# Patient Record
Sex: Female | Born: 1955 | Race: White | Hispanic: No | Marital: Married | Smoking: Current every day smoker
Health system: Southern US, Community
[De-identification: ages and names within clinical notes are randomized; demographics above are authoritative.]

## PROBLEM LIST (undated history)

## (undated) DIAGNOSIS — D638 Anemia in other chronic diseases classified elsewhere: Secondary | ICD-10-CM

## (undated) DIAGNOSIS — D689 Coagulation defect, unspecified: Secondary | ICD-10-CM

## (undated) DIAGNOSIS — I639 Cerebral infarction, unspecified: Secondary | ICD-10-CM

## (undated) DIAGNOSIS — I10 Essential (primary) hypertension: Secondary | ICD-10-CM

## (undated) DIAGNOSIS — K760 Fatty (change of) liver, not elsewhere classified: Secondary | ICD-10-CM

## (undated) DIAGNOSIS — K7030 Alcoholic cirrhosis of liver without ascites: Secondary | ICD-10-CM

## (undated) DIAGNOSIS — K922 Gastrointestinal hemorrhage, unspecified: Secondary | ICD-10-CM

## (undated) DIAGNOSIS — D696 Thrombocytopenia, unspecified: Secondary | ICD-10-CM

## (undated) DIAGNOSIS — S32000A Wedge compression fracture of unspecified lumbar vertebra, initial encounter for closed fracture: Secondary | ICD-10-CM

## (undated) DIAGNOSIS — Z8719 Personal history of other diseases of the digestive system: Secondary | ICD-10-CM

## (undated) DIAGNOSIS — K8020 Calculus of gallbladder without cholecystitis without obstruction: Secondary | ICD-10-CM

## (undated) DIAGNOSIS — F1010 Alcohol abuse, uncomplicated: Secondary | ICD-10-CM

## (undated) DIAGNOSIS — IMO0001 Reserved for inherently not codable concepts without codable children: Secondary | ICD-10-CM

## (undated) DIAGNOSIS — K219 Gastro-esophageal reflux disease without esophagitis: Secondary | ICD-10-CM

## (undated) DIAGNOSIS — F1027 Alcohol dependence with alcohol-induced persisting dementia: Secondary | ICD-10-CM

## (undated) DIAGNOSIS — F0390 Unspecified dementia without behavioral disturbance: Secondary | ICD-10-CM

## (undated) DIAGNOSIS — K7460 Unspecified cirrhosis of liver: Secondary | ICD-10-CM

## (undated) DIAGNOSIS — IMO0002 Reserved for concepts with insufficient information to code with codable children: Secondary | ICD-10-CM

## (undated) HISTORY — PX: HIP FRACTURE SURGERY: SHX118

## (undated) HISTORY — PX: ESOPHAGOGASTRODUODENOSCOPY (EGD) WITH ESOPHAGEAL DILATION: SHX5812

## (undated) HISTORY — PX: APPENDECTOMY: SHX54

## (undated) HISTORY — PX: COLONOSCOPY: SHX174

## (undated) HISTORY — PX: OTHER SURGICAL HISTORY: SHX169

---

## 2003-02-11 ENCOUNTER — Inpatient Hospital Stay (HOSPITAL_COMMUNITY): Admission: EM | Admit: 2003-02-11 | Discharge: 2003-02-12 | Payer: Self-pay | Admitting: Emergency Medicine

## 2003-02-11 ENCOUNTER — Encounter: Payer: Self-pay | Admitting: Emergency Medicine

## 2003-02-12 ENCOUNTER — Encounter: Payer: Self-pay | Admitting: *Deleted

## 2003-06-02 ENCOUNTER — Encounter: Payer: Self-pay | Admitting: Emergency Medicine

## 2003-06-02 ENCOUNTER — Emergency Department (HOSPITAL_COMMUNITY): Admission: EM | Admit: 2003-06-02 | Discharge: 2003-06-02 | Payer: Self-pay | Admitting: Emergency Medicine

## 2005-09-26 ENCOUNTER — Emergency Department (HOSPITAL_COMMUNITY): Admission: EM | Admit: 2005-09-26 | Discharge: 2005-09-26 | Payer: Self-pay | Admitting: Emergency Medicine

## 2006-09-11 DIAGNOSIS — IMO0001 Reserved for inherently not codable concepts without codable children: Secondary | ICD-10-CM

## 2006-09-11 DIAGNOSIS — I639 Cerebral infarction, unspecified: Secondary | ICD-10-CM

## 2006-09-11 HISTORY — DX: Cerebral infarction, unspecified: I63.9

## 2006-09-11 HISTORY — DX: Reserved for inherently not codable concepts without codable children: IMO0001

## 2007-04-12 HISTORY — PX: ESOPHAGOGASTRODUODENOSCOPY: SHX1529

## 2007-04-30 ENCOUNTER — Emergency Department (HOSPITAL_COMMUNITY): Admission: EM | Admit: 2007-04-30 | Discharge: 2007-05-01 | Payer: Self-pay | Admitting: Emergency Medicine

## 2007-05-01 ENCOUNTER — Ambulatory Visit: Payer: Self-pay | Admitting: Internal Medicine

## 2007-05-01 ENCOUNTER — Ambulatory Visit (HOSPITAL_COMMUNITY): Admission: RE | Admit: 2007-05-01 | Discharge: 2007-05-01 | Payer: Self-pay | Admitting: Internal Medicine

## 2007-05-01 ENCOUNTER — Encounter: Payer: Self-pay | Admitting: Internal Medicine

## 2007-05-21 ENCOUNTER — Ambulatory Visit (HOSPITAL_COMMUNITY): Admission: RE | Admit: 2007-05-21 | Discharge: 2007-05-21 | Payer: Self-pay | Admitting: Internal Medicine

## 2007-06-20 ENCOUNTER — Ambulatory Visit: Payer: Self-pay | Admitting: Gastroenterology

## 2007-07-13 HISTORY — PX: ESOPHAGOGASTRODUODENOSCOPY: SHX1529

## 2007-07-15 ENCOUNTER — Ambulatory Visit (HOSPITAL_COMMUNITY): Admission: RE | Admit: 2007-07-15 | Discharge: 2007-07-15 | Payer: Self-pay | Admitting: Internal Medicine

## 2007-07-15 ENCOUNTER — Encounter: Payer: Self-pay | Admitting: Internal Medicine

## 2007-07-15 ENCOUNTER — Ambulatory Visit: Payer: Self-pay | Admitting: Internal Medicine

## 2008-06-17 ENCOUNTER — Inpatient Hospital Stay (HOSPITAL_COMMUNITY): Admission: EM | Admit: 2008-06-17 | Discharge: 2008-06-17 | Payer: Self-pay | Admitting: Emergency Medicine

## 2009-11-30 ENCOUNTER — Emergency Department (HOSPITAL_COMMUNITY): Admission: EM | Admit: 2009-11-30 | Discharge: 2009-12-01 | Payer: Self-pay | Admitting: Emergency Medicine

## 2010-03-23 ENCOUNTER — Emergency Department (HOSPITAL_COMMUNITY): Admission: EM | Admit: 2010-03-23 | Discharge: 2010-03-24 | Payer: Self-pay | Admitting: Emergency Medicine

## 2010-10-02 ENCOUNTER — Encounter: Payer: Self-pay | Admitting: Internal Medicine

## 2010-10-03 ENCOUNTER — Encounter: Payer: Self-pay | Admitting: Internal Medicine

## 2010-12-04 LAB — URINALYSIS, ROUTINE W REFLEX MICROSCOPIC
Bilirubin Urine: NEGATIVE
Hgb urine dipstick: NEGATIVE
Ketones, ur: NEGATIVE mg/dL
Nitrite: NEGATIVE
Protein, ur: NEGATIVE mg/dL

## 2010-12-04 LAB — BASIC METABOLIC PANEL
CO2: 20 mEq/L (ref 19–32)
Calcium: 9.4 mg/dL (ref 8.4–10.5)
Chloride: 99 mEq/L (ref 96–112)
Creatinine, Ser: 0.63 mg/dL (ref 0.4–1.2)
GFR calc Af Amer: >60 mL/min (ref >60)
GFR calc non Af Amer: >60 mL/min (ref >60)
Glucose, Bld: 101 mg/dL — ABNORMAL HIGH (ref 70–99)
Sodium: 134 mEq/L — ABNORMAL LOW (ref 135–145)

## 2010-12-04 LAB — URINE CULTURE

## 2010-12-04 LAB — DIFFERENTIAL
Eosinophils Absolute: 0.2 10*3/uL (ref 0.0–0.7)
Lymphs Abs: 3.1 10*3/uL (ref 0.7–4.0)
Neutro Abs: 3.1 10*3/uL (ref 1.7–7.7)

## 2010-12-04 LAB — CBC
MCHC: 35.7 g/dL (ref 30.0–36.0)
Platelets: 151 10*3/uL (ref 150–400)
RDW: 15.5 % (ref 11.5–15.5)
WBC: 7 10*3/uL (ref 4.0–10.5)

## 2010-12-04 LAB — POCT CARDIAC MARKERS
CKMB, poc: <1 ng/mL — ABNORMAL LOW (ref 1.0–8.0)
Myoglobin, poc: 34.3 ng/mL (ref 12–200)
Troponin i, poc: <0.05 ng/mL (ref 0.00–0.09)

## 2011-01-24 NOTE — Op Note (Signed)
[name], ZAGAMI               ACCOUNT NO.:  [id number]   MEDICAL RECORD NO.:  [id number]          PATIENT TYPE:  AMB   LOCATION:  DAY                           FACILITY:  [facility]   PHYSICIAN:  R. Roetta Sessions, M.D. DATE OF BIRTH:  July 11, 1956   DATE OF PROCEDURE:  05/01/2007  DATE OF DISCHARGE:                               OPERATIVE REPORT   PROCEDURE:  EGD with biopsy.   INDICATIONS FOR PROCEDURE:  The patient is a 55 year old Caucasian  female who presented to Dr. Colon Branch in the ED last evening with chest  pain.  She was worked up and not felt to have any acute cardiopulmonary  issues, but Dr. Colon Branch noted that in her history [name] has had  problems with progressive esophageal dysphagia to solids and pills for  the past 3 months, and she reported a 65 pound weight loss largely due  to her inability to get food down.  She has not had any melena or rectal  bleeding.  She is a lifelong smoker and consumes at least 3 mixed drinks  weekly.  Her labs are pretty much unremarkable, although she did not  have any LFTs done.  Her chest pain resolved with symptomatic treatment  in the ED.  Dr. Colon Branch called me Debbie morning, and we arranged for her  to come for an EGD today.  I have discussed Debbie approach with Ms.  Valentine.  The potential risks, benefits, and alternatives have been  reviewed and questions answered.  She is agreeable.  Please see the  documentation in the medical record.   PROCEDURE:  O2 saturation, blood pressure, pulses, and respirations were  monitored throughout the entire procedure.  Conscious sedation was with  Versed 5 mg IV, Demerol 100 mg IV in divided doses, Cetacaine spray for  topical oropharyngeal anesthesia.  The instrument used was the Pentax  video chip system.   FINDINGS:  Examination of the tubular esophagus revealed a very tight  distal stricture.  It appeared to be benign.  Initially it would not  admit the scope.  I did not see any evidence of  neoplasm, Barrett's  esophagus, or esophagitis.  I applied gentle steady pressure, and scope  eventually traversed the strictured EG junction.  There was traumatic  dilation of Debbie area with a superficial tear of the overlying mucosa.  Please see photos.  There were no other apparent complications.   Stomach:  The gastric cavity was empty and insufflated well with air.  Thorough examination of the gastric mucosa including retroflex view of  the proximal stomach and esophagogastric junction demonstrated a small  hiatal hernia and superficial antral erosions and ulcerations.  There  was no obvious infiltrating process.  Particularly, the cardia looked  okay, aside from a small hiatal hernia.  The pylorus was patent and  easily traversed.   Duodenum:  Examination of the bulb and second portion again revealed  superficial ulcerations and erosions extending down through the second  portion of the duodenum.  There was a 1.5 to 2-cm submucosal nodule in  the second portion of the duodenum.  Please see photos.  Biopsies of the  duodenum and antral mucosa were taken for histologic study.   There was a respectable dilation performed with the passage of the  scope.  I did not feel would be safe to perform any additional dilations  with Maloney's, etc., during today's session.  The patient tolerated the  procedure well and was reactive in endoscopy.   IMPRESSION:  1. Critical tight, benign-appearing peptic stricture, status post      partial dilation with passage of the scope.  2. Small hiatal hernia.  3. Multiple antral erosions and superficial ulcerations extending down      into the second portion of the duodenum, submucosal mass in the      second portion of the duodenum of uncertain significance, status      post biopsy of the duodenum and antrum.   RECOMMENDATIONS:  1. [name] has had an astounding weight loss, unusually impressive      with a benign peptic stricture (if that is all  that is going on in      Debbie nice Valentine).  She needs to have a chest, abdomen, and pelvic CT      with IV contrast in the very near future to rule out other      coexisting processes.  2. Will obtain a baseline hepatic profile and obtain H pylori      serologies.  3. I have arranged for her to get Prevacid samples, 30 mg orally      b.i.d., enough for the next 6 weeks through my office.  When she      leaves here, she is to go get those samples and start on the      medication.  Literature on gastroesophageal reflux disease provided      to [name].  4. Will arrange for her to come back to the office to see [place] in 6      weeks.  She will need a repeat EGD in 6 weeks or so to reassess her      upper GI tract and in all likelihood will perform an additional      dilation.  5. Would also contemplate her first ever screening colonoscopy at that      time depending on how she does between now and then.   I would like to thank Dr. Greta Doom for allowing me to see Debbie very  interesting Valentine today.      Jonathon Bellows, M.D.  Electronically Signed     RMR/MEDQ  D:  05/01/2007  T:  05/02/2007  Job:  [id number]   cc:   Nicoletta Dress. Colon Branch, M.D.  Fax: [phone]   [facility] Dept. Freida Busman

## 2011-01-24 NOTE — H&P (Signed)
[name], [name]               ACCOUNT NO.:  [id number]   MEDICAL RECORD NO.:  [id number]          PATIENT TYPE:  INP   LOCATION:  A329                          FACILITY:  [facility]   PHYSICIAN:  Margaretmary Dys, M.D.DATE OF BIRTH:  Feb 26, 1956   DATE OF ADMISSION:  06/16/2008  DATE OF DISCHARGE:  LH                              HISTORY & PHYSICAL   ADMISSION DIAGNOSES:  1. Chest pain rule out MI (myocardial infarction).  2. History of uncontrolled hypertension.  3. History of cerebrovascular accident with left hemiparesis about a      year ago diagnosed as transient ischemic attack.  4. Acute alcohol intoxication with a blood alcohol level of 0.26.   CHIEF COMPLAINT:  Chest discomfort.   HISTORY OF PRESENT ILLNESS:  [name] is a 55 year old female who  presented to the emergency room with complaints of some coolness and  chest discomfort on the left side of her chest.  The patient reports  that this started about 5:30 a.m. yesterday.  The pain was on and off  intermittently.  She rates it about a 5/10.  The patient denies any  exacerbating factors.  When the patient was seen in the emergency room  she reported that she was pain free, however, she was asked to come to  the hospital by her son.   The patient did have a history of cerebrovascular accident with left  hemiparesis and dysarthria about a few months ago.  The patient said  this resolved completely.  She was told she had hypertension in the  health department and was supposed to be taking some medications and  aspirin once a day but she did not take those.  The patient said she  drinks alcohol and smokes.   The patient's chest pain was mostly located on the left side mostly  substernal and radiating to her left upper arm.  She said it was a  little bit of squeezing sometimes with tightness with no crushing  pleuritic pain.  She denies any significant cough.  She denies any prior  peptic ulcer like pain symptoms  which the patient has had in the past.  She denies any other complaints.   Evaluation in the emergency room was unremarkable.  The patient had a  negative chest x-ray, negative EKG.  Cardiac markers were negative  initially.  The patient is now being admitted for further evaluation and  management.   PAST MEDICAL HISTORY:  1. History of uncontrolled hypertension.  2. History of cerebrovascular accident with left hemiparesis and      dysarthria about 4 months ago.  3. History of esophageal stricture.  4. History of peptic ulcer disease.  5. Prior appendectomy.  6. The patient does have a history of EGD with dilatation by Dr. Jena Gauss      about a year ago.  The patient is scheduled for another EGD with      dilatation soon.   MEDICATIONS:  The patient does not take any medication.   ALLERGIES:  No known drug allergies.   FAMILY HISTORY:  Positive for hypertension and CVA in  father.   SOCIAL HISTORY:  The patient is married, lives with husband.  She has  two children.  She continues to drink alcohol with fair amount of vodka  every day.  She also smokes about a pack of cigarettes a day.  She is  currently not employed.  She denies any illicit drug use.  She denies  any recent ingestion of nonsteroidal anti-inflammatory agents.   Please note that from a prior admission the patient is supposed to be on  the following medications:   MEDICATIONS:  1. Hydrochlorothiazide 25 mg p.o. daily.  2. Aspirin 81 mg p.o. daily.  3. Vicodin 5/500 as needed.  4. Prevpac which she had for 2 days after a colonoscopy.   PHYSICAL EXAM:  GENERAL:  The patient was conscious, alert, comfortable,  not in acute distress, well oriented in time, place and person.  VITAL SIGNS:  Blood pressure on arrival was 135/85, pulse was 98.2,  respiratory rate of 18, temperature 98.2 degrees Fahrenheit, oxygen  saturation was 99% on room air.  HEENT:  Normocephalic, atraumatic.  Oral mucosa was moist with no   exudates.  NECK:  Supple.  No JVD or lymphadenopathy.  LUNGS:  Were clear clinically with good air entry bilaterally.  HEART:  S1-S2 regular.  No S3, S4, gallops or rubs.  ABDOMEN:  Soft, nontender.  Bowel sounds positive.  No masses palpable.  EXTREMITIES:  No pedal edema.  No calf induration or tenderness was  noted.  CNS:  Exam was grossly intact with no focal neurological deficits.  The  patient's strength and tone in the left extremities were noted to be  normal.   LABORATORY AND DIAGNOSTIC DATA:  White blood cell count was 6.9,  hemoglobin of 13.2, hematocrit 37.2, platelet count was 165 with no left  shift.  Sodium 134, potassium 3.1, chloride of 98, CO2 was 22, glucose  108, BUN of 5, creatinine was 0.74.  AST was 64, ALT of 15.  Initial  cardiac markers were negative.  Blood alcohol level was 0.281.  Chest x-  ray was negative with no active disease.  A 12-lead EKG shows normal  sinus rhythm with no acute ST-T changes.   ASSESSMENT:  This is a 55 year old lady presenting with chest pain.  Although the chest pain appears atypical she does have some risk factors  including hypertension, prior history of stroke, unknown lipid status  and smoking.   PLAN:  1. Will admit to the medical floor.  2. Will obtain cardiac enzymes to rule out myocardial infarction.  3. The patient may as well have a gastrointestinal complaint, this may      be related to her gastrointestinal.  She does have history of      esophageal stricture.  If her cardiac enzymes are negative and we      do consult with cardiology and cardiology rules out any cardiac      event the patient will need to see GI.  4. Will start aspirin 81 mg once a day.  5. The patient will be placed on DVT prophylaxis with Lovenox and GI      prophylaxis with Protonix.  6. If the patient rules out the patient may need outpatient stress      test or as determined by the cardiologist.  7. The patient's blood pressure is somewhat  acceptable at this point      and we will continue to monitor her very closely.  8. Will rule out the  patient with cardiac enzymes.  We will monitor      her closely for any evidence of alcohol withdrawal.  Will replace      her potassium.  9. We may request cardiology depending on if the patient has recurrent      chest pain for evaluation with possible stress test.   I have discussed the above plan with the patient and she verbalized full  understanding.  She is a full code status at this point.      Margaretmary Dys, M.D.  Electronically Signed     AM/MEDQ  D:  06/17/2008  T:  06/17/2008  Job:  [id number]

## 2011-01-24 NOTE — Discharge Summary (Signed)
[name], [name]               ACCOUNT NO.:  [id number]   MEDICAL RECORD NO.:  [id number]          PATIENT TYPE:  INP   LOCATION:  A329                          FACILITY:  [facility]   PHYSICIAN:  Osvaldo Shipper, MD     DATE OF BIRTH:  [birth year]   DATE OF ADMISSION:  06/16/2008  DATE OF DISCHARGE:  LH                               DISCHARGE SUMMARY   POTENTIAL DATE OF DISCHARGE:  06/17/2008.   PRIMARY CARE PHYSICIAN:  The patient goes to the health department for  her medical needs.   Please see the H and P dictated by Dr. Sherle Poe for details regarding  the patient's present illness.   DISCHARGE DIAGNOSES:  1. Chest pain, resolved.  Ruled out for acute coronary syndrome.      Third marker is still pending.  2. Uncontrolled hypertension.  3. History of cerebrovascular accident one year ago.  4. Alcohol abuse.   BRIEF HOSPITAL COURSE:  Briefly, this is a 55 year old Caucasian female  who presented to the ED in a state of acute alcohol intoxication.  Alcohol level was greater than 200.  She was complaining of chest pain,  however, her history was not very clear.  She was admitted for further  evaluation.  This morning or this afternoon when I saw this patient the  patient was a little bit more lucid.  She said that the pain resolved  some time overnight.  She said that she gets a lot of acid reflux and  she has had esophageal dilatation in the past.  She also mentions that  whenever her blood pressure is high she tends to get chest pain.  Her  EKG did not show any acute changes.  The pain started yesterday morning.  Her two sets of cardiac markers are negative.  We are waiting for a  third one from this afternoon and if that is negative she should be able  to go home.  The other reasons for her chest pain include her GI issues  with acid reflux and possible esophageal narrowing which could also be  causing her chest pains.  She had a negative stress test about 5 years  ago done  by Dr. Domingo Sep.  I will schedule another appointment with Dr.  Domingo Sep as an outpatient so that she can determine if further testing  is required.   Otherwise, the rest of her medical issues are stable.  She does consume  alcohol on a daily basis and she does consume a significant amount.  She  does have microcytosis up to 133 of MCV and this suggests that she does  intake quite a bit of alcohol.  I did provide counseling for alcohol  cessation.  I have explained the risks of continuing such high intake of  alcohol.   She tells me that she is on metoprolol but has been off of it for a  year.  Blood pressures here are running 96/66, 113/71 so I am not going  to prescribe her any antihypertensive medication at this time.  I have  explained to her that she needs to  go see her primary providers.   On the day of discharge the patient is feeling better.  The pain has  resolved.  She is very keen on going home.  No shortness of breath.  No  nausea or vomiting.  No palpitations.  No dizziness.  No diaphoresis.  Vital signs are all stable.  Lungs are clear to auscultation.  Cardiovascular exam otherwise unremarkable.  No edema is present.   Labs have been discussed.  She did have some hypokalemia which has  corrected.  She does have some leukopenia and actually she does have  pancytopenia which is probably from her alcohol intake.  Her albumin was  3.8.  Her AST was 64.  ALT was 15.  Total bilirubin was normal.  She  probably has bone marrow suppression from her significant alcohol  intake.   So at this time if the third marker is negative she can go home and then  follow up with Dr. Domingo Sep as an outpatient.   DISCHARGE MEDICATIONS:  Aspirin 81 mg daily.   FOLLOWUP:  With Dr. Domingo Sep within a week, with health department  within a week.   Please note that it took about 35 minutes for this discharge encounter.   ADDENDUM:  Third set of cardiac enzymes was negative.      Osvaldo Shipper, MD  Electronically Signed     GK/MEDQ  D:  06/17/2008  T:  06/17/2008  Job:  [id number]   cc:   Dani Gobble, MD  Fax: [phone]   Health Dept

## 2011-01-24 NOTE — Op Note (Signed)
[name], [name]               ACCOUNT NO.:  [id number]   MEDICAL RECORD NO.:  [id number]          PATIENT TYPE:  AMB   LOCATION:  DAY                           FACILITY:  [facility]   PHYSICIAN:  R. Roetta Sessions, M.D. DATE OF BIRTH:  1955/11/28   DATE OF PROCEDURE:  07/15/2007  DATE OF DISCHARGE:                               OPERATIVE REPORT   PROCEDURE:  EGD with Elease Hashimoto dilation, small bowel biopsy.   INDICATIONS FOR PROCEDURE:  55 year old Caucasian female with recurrent  esophageal dysphagia.  She was found to have a peptic stricture back in  April which was dilated with the scope only.  She has had recurrent  esophageal dysphagia.  She has evidence of H pylori on biopsy.  She was  treated.  EGD is now being done with potential for esophageal dilation.  Small bowel previously appeared normal.  The submucosal duodenal mass  with overlying abnormal mucosa; however, mucosal biopsies were negative.  She has had a CT since her last endoscopy which revealed no significant  findings.  EGD with esophageal dilation is now being performed.  This  approach has been discussed with the patient at length.  Potential  risks, benefits, and alternatives and have been reviewed and questions  answered.  She is agreeable.  Please see documentation in the medical  record.   PROCEDURE NOTE:  O2 saturation, blood pressure, pulses, and respirations  were monitored throughout the entire procedure.  Conscious sedation:  Versed 4 mg IV, Demerol 100 mg IV in divided doses.  Instrument:  Pentax  video chip system.  Cetacaine spray for topical pharyngeal anesthesia.   FINDINGS:  EGD examination of tubular esophagus again revealed a very  short fibrotic stricture of the EG junction.  This appeared to be more  inflammatory than just a ring.  The scope passed this area with slight  resistance, and it opened right up with again a partial dilation.  The  overlying esophageal mucosa otherwise appeared  normal.   Stomach:  The gastric cavity was empty and insufflated well with air.  Thoroughly examination of the gastric mucosa including retroflexion of  the proximal stomach and esophagogastric junction demonstrated multiple  antral and gastric body erosions.  There was no ulcer and no  infiltrating process.  The pylorus was patent and easily traversed.  Examination of the bulb, second and third portions demonstrated abnormal-  appearing duodenal mucosa with a pale appearance of the mucosa and some  scalloping as well.  Please see multiple photographs.  There was a 2-cm  submucosal mass in the second portion of the duodenum.  Please see  photos.  Biopsies of D2 and D3 were taken for histology again.  Subsequently, the scope was withdrawn.  A 54-French Maloney dilator was  passed to full insertion easily.  A look back revealed a superficial  tear just above the stricture at the EG junction.  The patient tolerated  the procedure well and was reactive in endoscopy.   IMPRESSION:  1. Short fibrotic-appearing peptic stricture, status post dilation      with scope and 54-French Maloney dilator.  Otherwise normal      esophagus.  2. Gastric erosions, otherwise normal stomach.  3. Patent pylorus.  4. 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  Abnormal-appearing second and third portions of the      duodenal mucosa status post repeat biopsy.   RECOMMENDATIONS:  1. Will go ahead and draw celiac antibody panel and followup with      biopsies.  2. Endoscopic ultrasound to further evaluate the submucosal mass in      her duodenum.  3. Once she is done with Prevpac therapy, she is to continue Prevacid      30 mg orally daily.  4. Followup appointment with [place] in 6 weeks.  In the interim, we will      arrange an EUS in [place].   In addition, her diastolic blood pressure was in the 100s before,  during, and after the procedure.  She is on HCTZ 25 mg orally  daily.  She needs to follow up with her primary care physician for further  evaluation and management of hypertension.      Jonathon Bellows, M.D.  Electronically Signed     RMR/MEDQ  D:  07/15/2007  T:  07/15/2007  Job:  [id number]

## 2011-01-24 NOTE — H&P (Signed)
[name], [name]               ACCOUNT NO.:  [id number]   MEDICAL RECORD NO.:  [id number]          PATIENT TYPE:  AMB   LOCATION:  DAY                            FACILITY:   PHYSICIAN:  R. Roetta Sessions, M.D. DATE OF BIRTH:  24-Jan-1956   DATE OF ADMISSION:  DATE OF DISCHARGE:  LH                              HISTORY & PHYSICAL   CHIEF COMPLAINT:  Follow-up of upper endoscopy for dysphagia and ulcers.   HISTORY OF PRESENT ILLNESS:  [name] is a 55 year old lady who Dr. Jena Gauss  scoped after emergency department visit.  She was having chest pain felt  to be atypical for cardiopulmonary issues.  She also had esophageal  dysphagia over a 23-month period of time and reported a 65-pound weight  loss.  Dr. Jena Gauss scoped her on May 01, 2007.  She had a critical  tight benign-appearing peptic stricture which was dilated with passage  of the scope.  She had a small hiatal hernia.  There were multiple  antral erosions and superficial ulcerations extending down into the  second portion of the duodenum, a submucosal mass in the second portion  of the duodenum of uncertain significance status post biopsy.  Biopsies  from the stomach revealed chronic active gastritis with H. pylori.  She  is currently on [facility] therapy.  She tells me, however, she is only  taking her Prevacid once daily.  She also had biopsy from the duodenum  which was benign.  She then had chest, abdominal and pelvic CT to  further evaluate abdominal pain and weight loss.  She had diffuse fatty  infiltration of the liver, gallstones, small uterine fibroids.   The patient states that she has had some improvement of her dysphagia,  but she has to chop her pills up.  She is eating her food chopped very  finely and soft foods.  She is taking her triple drug therapy for H.  pylori but is only taking Prevacid once a day.  It took her a while to  get the medication because she has had no insurance.  Her bowel  movements are  regular.  No melena or rectal bleeding.  She continues to  have midabdominal pain in the mornings and postprandially.  Last about  10-15 minutes at a time.  She consumes alcohol two to three times a  week, usually two to three mixed drinks at a time.  She smokes one pack  of cigarettes daily.   CURRENT MEDICATIONS:  1. Hydrochlorothiazide 25 mg daily.  2. Aspirin 81 mg daily.  3. Vicodin 5/500 mg as needed.  4. PREVPAC for two weeks.   ALLERGIES:  No known drug allergies.   PAST MEDICAL HISTORY:  1. Hypertension.  2. Prior appendectomy.  3. Peptic ulcer disease.  4. Esophageal stricture as outlined above.   FAMILY HISTORY:  Negative for colorectal cancer, liver disease or  ulcers, IBD.   SOCIAL HISTORY:  She is married.  She has two grandchildren.  Alcohol  and tobacco use as outlined above.  She is unemployed, but babysits to  earn some money.   REVIEW  OF SYSTEMS:  See HPI for GI.  CONSTITUTIONAL:  She feels that her  weight has finally stabilized.  CARDIOPULMONARY:  No chest pain or  shortness of breath.  GENITOURINARY:  No dysuria or hematuria.   PHYSICAL EXAMINATION:  VITAL SIGNS:  Weight 148, height 5 feet 5,  temperature 98.4, blood pressure 100/78, pulse 80.  GENERAL:  Pleasant, well-nourished, well-developed female in no acute  distress.  SKIN:  Warm and dry.  No jaundice.  HEENT: Sclerae nonicteric.  Oropharyngeal mucosa moist and pink.  No  lesions, erythema or exudate.  NECK:  No lymphadenopathy, thyromegaly.  CHEST:  Lungs are clear auscultation.  CARDIAC:  Exam reveals regular rate and rhythm.  Normal S1-S2 no  murmurs, rubs or gallops.  ABDOMEN:  Positive bowel sounds.  Abdomen soft and nondistended.  She  has mild tenderness in the left mid abdomen to deep palpation.  No  organomegaly or masses.  No rebound tenderness or guarding.  No  hepatosplenomegaly or masses.  No abdominal bruits or hernias.  EXTREMITIES:  No edema.   IMPRESSION:  [name] is a  55-year lady with critical esophageal  stricture with persistent esophageal dysphagia.  She did not have  complete dilatation due to the critical nature of her stricture at time  of her last esophagogastroduodenoscopy.  She also had peptic ulcer  disease and a submucosal duodenal mass.  She has persistent vague mid  abdominal pain with left-sided abdominal pain on exam.  Etiology not  well defined.  No findings on CT to explain her symptoms.   PLAN:  1. EGD with Dr. Jena Gauss in about four weeks.  This will give her time to      complete her H. pylori treatment.  I have asked her to increase her      Prevacid to 30 mg twice a day and we gave her #30 SoluTab samples.  2. Vicodin 5/500 #10 one p.o. t.i.d. p.r.n. pain, zero refills.  3. She will hold her aspirin for four days prior to procedure.  4. Further recommendations to follow.      Tana Coast, P.AJonathon Bellows, M.D.  Electronically Signed    LL/MEDQ  D:  06/20/2007  T:  06/21/2007  Job:  [id number]

## 2011-01-27 NOTE — H&P (Signed)
NAMEAZIZI, BALLY                         ACCOUNT NO.:  [id number]   MEDICAL RECORD NO.:  [id number]                   PATIENT TYPE:  INP   LOCATION:  A216                                 FACILITY:  [facility]   PHYSICIAN:  Hanley Hays. Dechurch, M.D.           DATE OF BIRTH:  [birth year]   DATE OF ADMISSION:  02/11/2003  DATE OF DISCHARGE:                                HISTORY & PHYSICAL   HISTORY OF PRESENT ILLNESS:  Patient is a 55 year old Caucasian female with  a past medical history of hypertension which is essentially untreated,  gastroesophageal reflux disease with intermittent left arm and chest pain  over the past several weeks at rest and with exertion without any pattern.  Yesterday she developed it while doing yard work with associated nausea,  diaphoresis.  It lasted several minutes and spontaneously resolved.  She  awakened this morning with nausea and arm pain, again lasting about five  minutes.  She described it as burning and tingling.  It occurred again later  this morning and she presented to the emergency room where it persisted.  It  was relieved after receiving two sublingual nitroglycerin and aspirin  tablets.   REVIEW OF SYSTEMS:  Pertinent for reflux.  She is not taking any medicine  secondary to expense.  She is noncompliant with her hypertension therapy.  She has dysphagia with solids which has been going on about two months.  She  has also had regurgitation with this particularly associated with solid  foods.  She denies any decrease in exercise tolerance, no dyspnea, no  wheezing.  She has had considerable social stressors recently and is quite  tearful during the interview.  She has no other GI or GU complaints.   PAST MEDICAL HISTORY:  1. Hypertension diagnosed about six months ago placed on hydrochlorothiazide     which she has not been taking.  2. She is gravida 2, para 2, A 0.  Normal pregnancies without hypertension     or hyperglycemia.  3. No  surgeries to date.   MEDICATIONS:  1. Which she is not taking but have been prescribed:  Hydrochlorothiazide     12.5 daily.  2. Prilosec.  3. She takes no over the counter medicines with the exception of an     occasional Advil.   ALLERGIES:  CODEINE.   FAMILY HISTORY:  Hypertension.  Mother died of leukemia at age 69.   SOCIAL HISTORY:  She is married.  Has been in good health.  Tobacco about a  pack per day for 30 years.  No alcohol use or abuse.  She has two children  one of whom requires a lot of assistance.  She is employed by cleaning  houses.   PHYSICAL EXAMINATION:  GENERAL:  Reveals a well-developed, well-nourished,  slightly overweight, obese, white female.  VITAL SIGNS:  Blood pressure 183/96, pulse is 110, respirations unlabored.  Saturation on room air  is 100%.  NECK:  Supple.  There is no JVD, adenopathy, thyromegaly.  HEENT:  Oral mucosa is moist.  Pupils equal, round and reactive to light.  LUNGS:  Clear to auscultation anterior and posterior without rales or  rhonchi.  HEART:  Regular rate and rhythm.  No murmur, gallop or rub.  ABDOMEN:  Obese, soft, nontender.  No mass.  EXTREMITIES:  Without clubbing, cyanosis or edema with good distal pulses.  NEUROLOGIC:  Intact.   LABORATORY DATA:  Pertinent for normal cardiac enzymes.   EKG without any ischemic change.  No old study for comparison.   Chest x-ray is normal.   Hemoglobin is 12.5 with an MCV of 101.   ASSESSMENT/PLAN:  1. A 55 year old with untreated hypertension and tobacco abuse with chest     pain which could be suggestive of angina.  Certainly she deserves to be     admitted and ruled out.  Hopefully we can do a Cardiolite stress     evaluation in the morning and therefore I am going to hold her beta-     blocker.  We will proceed with aspirin and Lovenox and monitor her     tolerance.  However, if she has recurrent pain we will consider     intravenous nitroglycerin and beta-blocker.   1.  Hypertension, previously not treated.  Blood pressure here in the     emergency room is in the 180s although initially it was 111 after     receiving a few sublingual nitroglycerin; therefore, I am going to begin     with Lopressor which should not interfere with that test and should be     well tolerated.   1. Gastroesophageal reflux disease with what sounds like dysphagia which may     be the etiology of all of her symptoms.  We will begin Protonix at some     point is going to need gastroenterology referral.  Need to check stools     and monitor.   1. Macrocytosis.  Check B-12 and folate.  No alcohol abuse by history.   1. Tobacco abuse.  The patient was counseled on smoking cessation and this     is offered.  Will provide further information during the hospital stay.                                               Hanley Hays Josefine Class, M.D.    FED/MEDQ  D:  02/11/2003  T:  02/11/2003  Job:  [id number]

## 2011-01-27 NOTE — Procedures (Signed)
   NAMEMCKINLEIGH, SCHUCHART                         ACCOUNT NO.:  [id number]   MEDICAL RECORD NO.:  [id number]                   PATIENT TYPE:  INP   LOCATION:  A216                                 FACILITY:  [facility]   PHYSICIAN:  Kem Boroughs, M.D.                 DATE OF BIRTH:  08-18-1956   DATE OF PROCEDURE:  02/12/2003  DATE OF DISCHARGE:                                    STRESS TEST   [name] is a 55 year old female who was admitted with chest discomfort and  is referred to evaluate for the possibility of cardiac ischemia.   EXERCISE STRESS TEST:  Baseline blood pressure is 142/88 mmHg with a resting  pulse of 66 beats per minute.  Baseline 12-lead electrocardiogram revealed  normal sinus rhythm without ischemic changes noted.  [name] exercised for  3 minutes and 26 seconds on a modified Bruce protocol.   She obtained her target heart rate of 148 beats per minute and proceeded to  a peak heart rate of 153 beats per minute and was maintained in stage I  throughout the procedure secondary to fatigue and lower extremity discomfort  bilaterally.  She denied chest pain during this procedure although she did  become somewhat short of breath.  Peak blood pressure was 210/98 mmHg.  The  EKG revealed sinus tachycardia at peak without ischemic changes noted.  There were no ischemic changes noted.  There were no further changes during  the recovery phase.   EXERCISE STRESS TEST IMPRESSION:  Negative adequate Bruce protocol exercise  stress test.   [name] exhibited a hypertensive response to exercise, as well as  significant decrease in exercise capacity.  She also had suggestion of  claudication, as evidenced by bilateral lower extremity discomfort with  ambulation.   1. Negative adequate Bruce protocol stress test.  2. Symptoms suggestive of bilateral claudication.  3. Hypertensive response to exercise.  4. Poor overall functional capacity.  5. Scintigraphic images are  pending.                                               Kem Boroughs, M.D.    TB/MEDQ  D:  02/12/2003  T:  02/12/2003  Job:  [id number]   cc:   [facility] Department

## 2011-01-27 NOTE — Discharge Summary (Signed)
[name], [name]                         ACCOUNT NO.:  [id number]   MEDICAL RECORD NO.:  [id number]                   PATIENT TYPE:  INP   LOCATION:  A216                                 FACILITY:  [facility]   PHYSICIAN:  Hanley Hays. Dechurch, M.D.           DATE OF BIRTH:  Jan 28, 1956   DATE OF ADMISSION:  02/11/2003  DATE OF DISCHARGE:  02/12/2003                                 DISCHARGE SUMMARY   DIAGNOSES:  1. Chest pain, probable noncardiac.  2. Gastroesophageal reflux disease with dysphagia.  3. Tobacco abuse.  4. Low normal B12 (250).  5. Chronic alcohol use.  6. Tobacco abuse.  7. Hypertension.  8. History of hyperlipidemia, not confirmed.   DISPOSITION:  The patient is discharged to home.  Follow up with Public  Health February 16, 2003 for followup of blood pressure and lipids.   CONDITION:  Stable.   HOSPITAL COURSE:  A 55 year old Caucasian female presented with burning  substernal chest pain and nausea.  The patient had no further pain while in  the hospital.  Her enzymes remained normal.  She underwent Cardiolite stress  testing on February 12, 2003.  Her exercise tolerance was poor.  She had a  hypertensive response to exercise.  She complained of cramping in her lower  extremities; however, she notes she does not have cramping when walking up  and down stairs and with her usual activities.  It was felt that this  probably did not represent cardiac ischemia; however, she clearly has risk  factors.  The patient was noted to have macrocytosis on admission with an  MCV of 101.  The B12 level was 250, which is at the low end of normal.  Her  hemoglobin was 12.5.  The patient, on further inquiry, admits to drinking  two to three drinks per day of alcohol and sometimes more.  She was  counseled on alcohol use and possible implications.  She was also counseled  on smoking cessation.  In addition, the patient has reflux.  It was  explained how alcohol can impact her reflux.  It  was recommended that she  utilize Prilosec over-the-counter, which would be more cost effective for  her than the prescription medications.  She has hydrochlorothiazide at home,  which she was advised to resume and take religiously as well as follow up on  February 16, 2003, as she would need close followup of her blood pressure and  further evaluation of her hyperlipidemia and treatment.  The concern was  whether the patient was experiencing claudication on the exercise test.  This was not further evaluated during this hospital stay and can be deferred  to outpatient evaluation.  She has good distal pulses with perhaps slight  decrease on the left compared with the right.  Again, this will be deferred  to her primary care Can Lucci.  The patient is being discharged to home in  improved condition.  DISCHARGE PHYSICAL EXAMINATION:  VITAL SIGNS:  Blood pressure at the time of  discharge is 140/80, pulse 74 and regular, respirations unlabored.  CHEST:  Diminished, but clear.  HEART:  Regular rate and rhythm.  No murmur, gallop or rub.  ABDOMEN:  Obese, soft, and nontender.  EXTREMITIES:  Without clubbing, cyanosis or edema.  NEUROLOGIC:  Intact.   ASSESSMENT/PLAN:  As noted above.   DISCHARGE MEDICATIONS:  1. Prilosec OTC 20 mg daily.  2. Hydrochlorothiazide 12.5 daily.  3. Baby aspirin 81 mg coated daily.  4. B-complex vitamins daily.                                               Hanley Hays Josefine Class, M.D.    FED/MEDQ  D:  02/12/2003  T:  02/12/2003  Job:  [id number]   cc:   Stanton Kidney NP [facility]

## 2011-02-07 ENCOUNTER — Emergency Department (HOSPITAL_COMMUNITY)
Admission: EM | Admit: 2011-02-07 | Discharge: 2011-02-07 | Disposition: A | Discharge status: Home / Self Care | Payer: Self-pay | Attending: Emergency Medicine | Admitting: Emergency Medicine

## 2011-02-07 DIAGNOSIS — I10 Essential (primary) hypertension: Secondary | ICD-10-CM | POA: Insufficient documentation

## 2011-02-07 DIAGNOSIS — F17200 Nicotine dependence, unspecified, uncomplicated: Secondary | ICD-10-CM | POA: Insufficient documentation

## 2011-02-07 DIAGNOSIS — IMO0002 Reserved for concepts with insufficient information to code with codable children: Secondary | ICD-10-CM | POA: Insufficient documentation

## 2011-06-12 LAB — DIFFERENTIAL
Basophils Absolute: 0.1
Basophils Absolute: 0.1
Basophils Relative: 1
Eosinophils Absolute: 0.2
Eosinophils Relative: 5
Lymphs Abs: 2.7
Monocytes Absolute: 0.3
Monocytes Relative: 7
Monocytes Relative: 8
Neutro Abs: 1.6 — ABNORMAL LOW
Neutro Abs: 3.5
Neutrophils Relative %: 51

## 2011-06-12 LAB — CBC
Hemoglobin: 11.6 — ABNORMAL LOW
Hemoglobin: 13.2
MCHC: 35.2
MCV: 133.3 — ABNORMAL HIGH
Platelets: 140 — ABNORMAL LOW
RBC: 2.83 — ABNORMAL LOW
RDW: 16.9 — ABNORMAL HIGH
RDW: 17 — ABNORMAL HIGH
WBC: 3.9 — ABNORMAL LOW

## 2011-06-12 LAB — BASIC METABOLIC PANEL
CO2: 24
Calcium: 8.7
Creatinine, Ser: 0.67
GFR calc Af Amer: >60
Glucose, Bld: 94

## 2011-06-12 LAB — POCT CARDIAC MARKERS
CKMB, poc: <1 — ABNORMAL LOW
Troponin i, poc: <0.05

## 2011-06-12 LAB — LIPID PANEL
Triglycerides: 215 — ABNORMAL HIGH
VLDL: 43 — ABNORMAL HIGH

## 2011-06-12 LAB — COMPREHENSIVE METABOLIC PANEL
Alkaline Phosphatase: 81
CO2: 22
GFR calc Af Amer: >60
GFR calc non Af Amer: >60
Potassium: 3.1 — ABNORMAL LOW
Sodium: 134 — ABNORMAL LOW
Total Bilirubin: 0.8

## 2011-06-12 LAB — CARDIAC PANEL(CRET KIN+CKTOT+MB+TROPI)
CK, MB: 0.4
CK, MB: 0.5
Relative Index: INVALID
Total CK: 17
Troponin I: 0.01

## 2011-06-20 LAB — GLIADIN ANTIBODIES, SERUM: Gliadin IgG: 0.4 U/mL (ref <7)

## 2011-06-20 LAB — TISSUE TRANSGLUTAMINASE, IGA: Tissue Transglutaminase Ab, IgA: 1.3 U/mL (ref <7)

## 2011-06-23 LAB — PROTIME-INR
INR: 0.9
Prothrombin Time: 12.8

## 2011-06-23 LAB — CBC
HCT: 39.6
MCHC: 34
MCV: 112.4 — ABNORMAL HIGH
Platelets: 242
WBC: 7.4

## 2011-06-23 LAB — BASIC METABOLIC PANEL
BUN: 5 — ABNORMAL LOW
CO2: 22
Chloride: 98
Creatinine, Ser: 0.72
Glucose, Bld: 105 — ABNORMAL HIGH
Potassium: 3.4 — ABNORMAL LOW

## 2011-06-23 LAB — HEPATIC FUNCTION PANEL
Bilirubin, Direct: 0.2
Total Bilirubin: 0.5

## 2011-06-23 LAB — DIFFERENTIAL
Basophils Relative: 1
Eosinophils Absolute: 0.3
Eosinophils Relative: 5
Lymphs Abs: 3
Neutrophils Relative %: 47

## 2011-06-23 LAB — CREATININE, SERUM
Creatinine, Ser: 0.81
GFR calc Af Amer: >60
GFR calc non Af Amer: >60

## 2011-06-23 LAB — POCT CARDIAC MARKERS
Myoglobin, poc: 33.3
Operator id: 241131

## 2011-06-23 LAB — H. PYLORI ANTIBODY, IGG: H Pylori IgG: 4.4 — ABNORMAL HIGH

## 2011-09-24 ENCOUNTER — Emergency Department (HOSPITAL_COMMUNITY): Payer: Self-pay

## 2011-09-24 ENCOUNTER — Other Ambulatory Visit: Payer: Self-pay

## 2011-09-24 ENCOUNTER — Encounter (HOSPITAL_COMMUNITY): Payer: Self-pay

## 2011-09-24 ENCOUNTER — Emergency Department (HOSPITAL_COMMUNITY)
Admission: EM | Admit: 2011-09-24 | Discharge: 2011-09-25 | Disposition: A | Discharge status: Home / Self Care | Payer: Self-pay | Attending: Emergency Medicine | Admitting: Emergency Medicine

## 2011-09-24 DIAGNOSIS — F17200 Nicotine dependence, unspecified, uncomplicated: Secondary | ICD-10-CM | POA: Insufficient documentation

## 2011-09-24 DIAGNOSIS — R0602 Shortness of breath: Secondary | ICD-10-CM | POA: Insufficient documentation

## 2011-09-24 DIAGNOSIS — R079 Chest pain, unspecified: Secondary | ICD-10-CM | POA: Insufficient documentation

## 2011-09-24 DIAGNOSIS — J40 Bronchitis, not specified as acute or chronic: Secondary | ICD-10-CM

## 2011-09-24 DIAGNOSIS — R059 Cough, unspecified: Secondary | ICD-10-CM | POA: Insufficient documentation

## 2011-09-24 DIAGNOSIS — R05 Cough: Secondary | ICD-10-CM | POA: Insufficient documentation

## 2011-09-24 LAB — CBC
Platelets: 99 10*3/uL — ABNORMAL LOW (ref 150–400)
RBC: 2.87 MIL/uL — ABNORMAL LOW (ref 3.87–5.11)
RDW: 13.6 % (ref 11.5–15.5)
WBC: 6.5 10*3/uL (ref 4.0–10.5)

## 2011-09-24 LAB — BASIC METABOLIC PANEL
CO2: 24 mEq/L (ref 19–32)
Calcium: 9.9 mg/dL (ref 8.4–10.5)
Chloride: 96 mEq/L (ref 96–112)
Potassium: 3.3 mEq/L — ABNORMAL LOW (ref 3.5–5.1)
Sodium: 133 mEq/L — ABNORMAL LOW (ref 135–145)

## 2011-09-24 LAB — DIFFERENTIAL
Basophils Absolute: 0 10*3/uL (ref 0.0–0.1)
Lymphocytes Relative: 48 % — ABNORMAL HIGH (ref 12–46)
Lymphs Abs: 3.1 10*3/uL (ref 0.7–4.0)
Neutro Abs: 2.9 10*3/uL (ref 1.7–7.7)
Neutrophils Relative %: 45 % (ref 43–77)

## 2011-09-24 LAB — TROPONIN I: Troponin I: <0.3 ng/mL (ref <0.30)

## 2011-09-24 LAB — D-DIMER, QUANTITATIVE: D-Dimer, Quant: 0.7 ug/mL-FEU — ABNORMAL HIGH (ref 0.00–0.48)

## 2011-09-24 MED ORDER — ONDANSETRON 4 MG PO TBDP
4.0000 mg | ORAL_TABLET | Freq: Once | ORAL | Status: AC
  Administered 2011-09-24: 4 mg via ORAL
  Filled 2011-09-24: qty 1

## 2011-09-24 MED ORDER — ALBUTEROL SULFATE (5 MG/ML) 0.5% IN NEBU
5.0000 mg | INHALATION_SOLUTION | Freq: Once | RESPIRATORY_TRACT | Status: AC
  Administered 2011-09-25: 5 mg via RESPIRATORY_TRACT
  Filled 2011-09-24: qty 1

## 2011-09-24 NOTE — ED Provider Notes (Signed)
History  This chart was scribed for [name], MD by Bennett Scrape. This patient was seen in room APA09/APA09 and the patient's care was started at 10:30PM.  CSN: [id number]  Arrival date & time 09/24/11  2204   First MD Initiated Contact with Patient 09/24/11 2221      Chief Complaint  Patient presents with  . Cough  . Chest Pain   The history is provided by the patient. No language interpreter was used.   [name] is a 56 y.o. female who presents to the Emergency Department complaining of two weeks of gradual onset, gradually worsening, constant productive cough with associated SOB, chest discomfort when coughing and post-tussive emesis. She denies any modifying factors. She has not taken any medication at home to improve symptoms. She denies any sick contacts at home. Pt states that she was seen in this ED several weeks ago and told she had bronchitis. She states that she was given a prescription to fill but never did. There is no record of her being seen at this hospital within the past month. She has no h/o chronic medical problems and is not on any regular medications currently. Pt is a smoker and alcohol user.   Pt's PCP Dr. Freida Busman with [facility].   History reviewed. No pertinent past medical history.  History reviewed. No pertinent past surgical history.  No family history on file.  History  Substance Use Topics  . Smoking status: Current Everyday Smoker  . Smokeless tobacco: Not on file  . Alcohol Use: Yes    OB History    Grav Para Term Preterm Abortions TAB SAB Ect Mult Living                  Review of Systems  A complete 10 system review of systems was obtained and is otherwise negative except as noted in the HPI.   Allergies  Review of patient's allergies indicates no known allergies.  Home Medications   Current Outpatient Rx  Name Route Sig Dispense Refill  . ALBUTEROL SULFATE HFA 108 (90 BASE) MCG/ACT IN AERS Inhalation  Inhale 1-2 puffs into the lungs every 6 (six) hours as needed for wheezing. 1 Inhaler 0  . DOXYCYCLINE HYCLATE 100 MG PO CAPS Oral Take 1 capsule (100 mg total) by mouth 2 (two) times daily. 20 capsule 0    Triage Vitals: BP 134/86  Pulse 118  Temp(Src) 98.3 F (36.8 C) (Oral)  Resp 22  Ht 5\' 4"  (1.626 m)  Wt 140 lb (63.504 kg)  BMI 24.03 kg/m2  SpO2 99%  Physical Exam  Nursing note and vitals reviewed. Constitutional: She is oriented to person, place, and time. She appears well-developed and well-nourished.       Pt is actively gagging and speaking in short sentences.  HENT:  Head: Normocephalic and atraumatic.  Eyes: Conjunctivae and EOM are normal.  Neck: Normal range of motion. Neck supple.  Cardiovascular: Normal rate, regular rhythm and normal heart sounds.   Pulmonary/Chest: Effort normal and breath sounds normal. No respiratory distress.  Abdominal: Soft. There is no tenderness.  Musculoskeletal: Normal range of motion. She exhibits no edema.  Neurological: She is alert and oriented to person, place, and time. No cranial nerve deficit.  Skin: Skin is warm and dry. No rash noted.  Psychiatric: She has a normal mood and affect. Her behavior is normal.    ED Course  Procedures (including critical care time)  DIAGNOSTIC STUDIES: Oxygen Saturation is 99%  on room air, normal by my interpretation.    COORDINATION OF CARE: 10:34PM-Discussed treatment plan with pt at bedside and pt agreed to plan. Counseled pt on smoking cessation.  11:46PM-Discussed lab results and pt acknowledged results. Pt agreed to CT scan.   Labs Reviewed  CBC - Abnormal; Notable for the following:    RBC 2.87 (*)    MCV 128.9 (*)    MCH 46.0 (*)    Platelets 99 (*)    All other components within normal limits  DIFFERENTIAL - Abnormal; Notable for the following:    Lymphocytes Relative 48 (*)    All other components within normal limits  BASIC METABOLIC PANEL - Abnormal; Notable for the  following:    Sodium 133 (*)    Potassium 3.3 (*)    Glucose, Bld 106 (*)    All other components within normal limits  D-DIMER, QUANTITATIVE - Abnormal; Notable for the following:    D-Dimer, Quant 0.70 (*)    All other components within normal limits  TROPONIN I  LAB REPORT - SCANNED   Dg Chest 2 View  09/24/2011  *RADIOLOGY REPORT*  Clinical Data: Cough, vomiting, bronchitis, smoker  CHEST - 2 VIEW  Comparison:  11/30/2009  Findings:  The heart size and mediastinal contours are within normal limits.  Both lungs are clear.  The visualized skeletal structures are unremarkable.  IMPRESSION: No active cardiopulmonary disease.  Original Report Authenticated By: Judie Petit. Ruel Favors, M.D.   Ct Angio Chest W/cm &/or Wo Cm  09/25/2011  *RADIOLOGY REPORT*  Clinical Data: Shortness of breath, cough, chest pain, smoker  CT ANGIOGRAPHY CHEST  Technique:  Multidetector CT imaging of the chest using the standard protocol during bolus administration of intravenous contrast. Multiplanar reconstructed images including MIPs were obtained and reviewed to evaluate the vascular anatomy.  Contrast: OMNIPAQUE IOHEXOL 350 MG/ML IV SOLN  Comparison: 05/21/2007  Findings: Study is diagnostic.  No filling defect or significant pulmonary embolus demonstrated by CTA.  Pulsation artifact of the thoracic aorta.  Negative for dissection or mediastinal hemorrhage. Normal heart size.  No pericardial or pleural effusion.  No significant hiatal hernia.  Thyroid gland is enlarged but stable.  Very dense breast parenchyma bilaterally as before.  No supraclavicular or axillary adenopathy.  Upper abdominal imaging demonstrates diffuse fatty infiltration of the liver and cholelithiasis.  No other acute upper abdominal finding.  Lung windows demonstrate minimal hypoventilatory changes/ground- glass opacity and atelectasis diffusely.  No focal pneumonia, collapse, consolidation, airspace disease, or interstitial process. Trachea and  central airways are patent.  IMPRESSION: Negative for significant acute pulmonary embolus by CTA.  No acute intrathoracic finding  Hepatic steatosis and cholelithiasis in the upper abdomen.  Original Report Authenticated By: Judie Petit. Ruel Favors, M.D.     1. Bronchitis       MDM  Productive cough, shortness of breath, anterior chest pain with coughing for two work.  Associated with posttussive emesis no fever. No abdominal pain, nausea, vomiting.  Tachycardic with coarse breath sounds.  Anterior diffuse chest pain with coughing.  Suspect bronchitis in smoker with productive cough, r/o PNA.  Doubt ACS.  Ddimer given tachycardia and SOB to screen for PE.  I personally performed the services described in this documentation, which was scribed in my presence.  The recorded information has been reviewed and considered.   Date: 09/24/2011  Rate: 96  Rhythm: normal sinus rhythm  QRS Axis: normal  Intervals: normal  ST/T Wave abnormalities: nonspecific ST/T changes  Conduction  Disutrbances:none  Narrative Interpretation:   Old EKG Reviewed: none available       [name], MD 09/25/11 1453

## 2011-09-24 NOTE — ED Notes (Signed)
Pt to xray

## 2011-09-24 NOTE — ED Notes (Signed)
Productive cough, sob, chest discomfort x 1 week, seen here several weeks ago and states was supposed to get a prescription at that time but never got it filled, states has been sick since then.

## 2011-09-25 MED ORDER — ALBUTEROL SULFATE HFA 108 (90 BASE) MCG/ACT IN AERS: 1.0000 | INHALATION_SPRAY | Freq: Four times a day (QID) | RESPIRATORY_TRACT | Status: DC | PRN

## 2011-09-25 MED ORDER — IOHEXOL 350 MG/ML SOLN
100.0000 mL | Freq: Once | INTRAVENOUS | Status: AC | PRN
  Administered 2011-09-25: 100 mL via INTRAVENOUS

## 2011-09-25 MED ORDER — DOXYCYCLINE HYCLATE 100 MG PO CAPS: 100.0000 mg | ORAL_CAPSULE | Freq: Two times a day (BID) | ORAL | Status: AC

## 2011-09-25 MED ORDER — LORAZEPAM 1 MG PO TABS
1.0000 mg | ORAL_TABLET | Freq: Once | ORAL | Status: AC
  Administered 2011-09-25: 1 mg via ORAL
  Filled 2011-09-25: qty 1

## 2011-09-25 NOTE — ED Notes (Signed)
Pt receiving breathing treatment.

## 2011-09-25 NOTE — ED Notes (Signed)
Pt ambulated in the hallway pulse ox remained above 98% at all times.

## 2011-09-25 NOTE — ED Notes (Signed)
Pt upset because husband wants her to leave AMA. Pt advised of lab results and is willing to stay but is tearful.

## 2013-07-22 ENCOUNTER — Inpatient Hospital Stay (HOSPITAL_COMMUNITY)
Admission: EM | Admit: 2013-07-22 | Discharge: 2013-07-24 | DRG: 392 | Disposition: A | Discharge status: Home / Self Care | Payer: Self-pay | Attending: Family Medicine | Admitting: Family Medicine

## 2013-07-22 ENCOUNTER — Emergency Department (HOSPITAL_COMMUNITY): Payer: Self-pay

## 2013-07-22 ENCOUNTER — Encounter (HOSPITAL_COMMUNITY): Payer: Self-pay | Admitting: Emergency Medicine

## 2013-07-22 DIAGNOSIS — Z8673 Personal history of transient ischemic attack (TIA), and cerebral infarction without residual deficits: Secondary | ICD-10-CM

## 2013-07-22 DIAGNOSIS — F17200 Nicotine dependence, unspecified, uncomplicated: Secondary | ICD-10-CM | POA: Diagnosis present

## 2013-07-22 DIAGNOSIS — E860 Dehydration: Secondary | ICD-10-CM | POA: Diagnosis present

## 2013-07-22 DIAGNOSIS — E871 Hypo-osmolality and hyponatremia: Secondary | ICD-10-CM | POA: Diagnosis present

## 2013-07-22 DIAGNOSIS — E876 Hypokalemia: Secondary | ICD-10-CM | POA: Diagnosis present

## 2013-07-22 DIAGNOSIS — I10 Essential (primary) hypertension: Secondary | ICD-10-CM | POA: Diagnosis present

## 2013-07-22 DIAGNOSIS — Q619 Cystic kidney disease, unspecified: Secondary | ICD-10-CM

## 2013-07-22 DIAGNOSIS — K810 Acute cholecystitis: Secondary | ICD-10-CM | POA: Diagnosis present

## 2013-07-22 DIAGNOSIS — K7689 Other specified diseases of liver: Secondary | ICD-10-CM | POA: Diagnosis present

## 2013-07-22 DIAGNOSIS — K8020 Calculus of gallbladder without cholecystitis without obstruction: Secondary | ICD-10-CM | POA: Diagnosis present

## 2013-07-22 DIAGNOSIS — A599 Trichomoniasis, unspecified: Secondary | ICD-10-CM | POA: Diagnosis present

## 2013-07-22 DIAGNOSIS — E039 Hypothyroidism, unspecified: Secondary | ICD-10-CM | POA: Diagnosis present

## 2013-07-22 DIAGNOSIS — K219 Gastro-esophageal reflux disease without esophagitis: Secondary | ICD-10-CM | POA: Diagnosis present

## 2013-07-22 DIAGNOSIS — F1010 Alcohol abuse, uncomplicated: Secondary | ICD-10-CM | POA: Diagnosis present

## 2013-07-22 DIAGNOSIS — D7589 Other specified diseases of blood and blood-forming organs: Secondary | ICD-10-CM | POA: Diagnosis present

## 2013-07-22 DIAGNOSIS — N281 Cyst of kidney, acquired: Secondary | ICD-10-CM

## 2013-07-22 DIAGNOSIS — K5289 Other specified noninfective gastroenteritis and colitis: Principal | ICD-10-CM | POA: Diagnosis present

## 2013-07-22 DIAGNOSIS — K529 Noninfective gastroenteritis and colitis, unspecified: Secondary | ICD-10-CM | POA: Diagnosis present

## 2013-07-22 HISTORY — DX: Calculus of gallbladder without cholecystitis without obstruction: K80.20

## 2013-07-22 HISTORY — DX: Essential (primary) hypertension: I10

## 2013-07-22 HISTORY — DX: Cerebral infarction, unspecified: I63.9

## 2013-07-22 HISTORY — DX: Reserved for inherently not codable concepts without codable children: IMO0001

## 2013-07-22 HISTORY — DX: Alcohol abuse, uncomplicated: F10.10

## 2013-07-22 HISTORY — DX: Fatty (change of) liver, not elsewhere classified: K76.0

## 2013-07-22 HISTORY — DX: Gastro-esophageal reflux disease without esophagitis: K21.9

## 2013-07-22 LAB — CBC WITH DIFFERENTIAL/PLATELET
Basophils Absolute: 0 10*3/uL (ref 0.0–0.1)
Basophils Relative: 0 % (ref 0–1)
Hemoglobin: 13.8 g/dL (ref 12.0–15.0)
Lymphocytes Relative: 8 % — ABNORMAL LOW (ref 12–46)
MCHC: 36 g/dL (ref 30.0–36.0)
Monocytes Relative: 8 % (ref 3–12)
Neutro Abs: 7.3 10*3/uL (ref 1.7–7.7)
Neutrophils Relative %: 84 % — ABNORMAL HIGH (ref 43–77)
RDW: 13.9 % (ref 11.5–15.5)
WBC: 8.7 10*3/uL (ref 4.0–10.5)

## 2013-07-22 LAB — COMPREHENSIVE METABOLIC PANEL
ALT: 24 U/L (ref 0–35)
BUN: 18 mg/dL (ref 6–23)
CO2: 24 mEq/L (ref 19–32)
Calcium: 9.1 mg/dL (ref 8.4–10.5)
Chloride: 89 mEq/L — ABNORMAL LOW (ref 96–112)
Creatinine, Ser: 0.7 mg/dL (ref 0.50–1.10)
GFR calc Af Amer: >90 mL/min (ref >90)
GFR calc non Af Amer: >90 mL/min (ref >90)
Glucose, Bld: 120 mg/dL — ABNORMAL HIGH (ref 70–99)
Potassium: 3 mEq/L — ABNORMAL LOW (ref 3.5–5.1)
Total Protein: 8.2 g/dL (ref 6.0–8.3)

## 2013-07-22 LAB — PROTIME-INR
INR: 1.19 (ref 0.00–1.49)
Prothrombin Time: 14.8 seconds (ref 11.6–15.2)

## 2013-07-22 LAB — ETHANOL: Alcohol, Ethyl (B): <11 mg/dL (ref 0–11)

## 2013-07-22 LAB — TROPONIN I: Troponin I: <0.3 ng/mL (ref <0.30)

## 2013-07-22 LAB — LIPASE, BLOOD: Lipase: 16 U/L (ref 11–59)

## 2013-07-22 LAB — OCCULT BLOOD, POC DEVICE: Fecal Occult Bld: NEGATIVE

## 2013-07-22 MED ORDER — ONDANSETRON HCL 4 MG/2ML IJ SOLN: 4.0000 mg | Freq: Four times a day (QID) | INTRAMUSCULAR | Status: DC | PRN

## 2013-07-22 MED ORDER — LORAZEPAM 2 MG/ML IJ SOLN
1.0000 mg | Freq: Once | INTRAMUSCULAR | Status: AC
  Administered 2013-07-22: 1 mg via INTRAVENOUS
  Filled 2013-07-22: qty 1

## 2013-07-22 MED ORDER — VITAMIN B-1 100 MG PO TABS
100.0000 mg | ORAL_TABLET | Freq: Every day | ORAL | Status: DC
  Administered 2013-07-23: 100 mg via ORAL
  Filled 2013-07-22: qty 1

## 2013-07-22 MED ORDER — ONDANSETRON HCL 4 MG PO TABS: 4.0000 mg | ORAL_TABLET | Freq: Four times a day (QID) | ORAL | Status: DC | PRN

## 2013-07-22 MED ORDER — FOLIC ACID 1 MG PO TABS
1.0000 mg | ORAL_TABLET | Freq: Every day | ORAL | Status: DC
  Administered 2013-07-23: 1 mg via ORAL
  Filled 2013-07-22: qty 1

## 2013-07-22 MED ORDER — ACETAMINOPHEN 325 MG PO TABS
650.0000 mg | ORAL_TABLET | Freq: Four times a day (QID) | ORAL | Status: DC | PRN
  Administered 2013-07-24: 650 mg via ORAL
  Filled 2013-07-22: qty 2

## 2013-07-22 MED ORDER — IOHEXOL 300 MG/ML  SOLN
50.0000 mL | Freq: Once | INTRAMUSCULAR | Status: AC | PRN
  Administered 2013-07-22: 50 mL via ORAL

## 2013-07-22 MED ORDER — ADULT MULTIVITAMIN W/MINERALS CH
1.0000 | ORAL_TABLET | Freq: Every day | ORAL | Status: DC
  Administered 2013-07-23: 1 via ORAL
  Filled 2013-07-22: qty 1

## 2013-07-22 MED ORDER — SODIUM CHLORIDE 0.9 % IV BOLUS (SEPSIS)
500.0000 mL | Freq: Once | INTRAVENOUS | Status: AC
  Administered 2013-07-22: 500 mL via INTRAVENOUS
  Administered 2013-07-22: 1000 mL via INTRAVENOUS

## 2013-07-22 MED ORDER — SODIUM CHLORIDE 0.9 % IJ SOLN: 3.0000 mL | Freq: Two times a day (BID) | INTRAMUSCULAR | Status: DC

## 2013-07-22 MED ORDER — ONDANSETRON HCL 4 MG/2ML IJ SOLN
4.0000 mg | INTRAMUSCULAR | Status: DC | PRN
  Administered 2013-07-22: 4 mg via INTRAVENOUS
  Filled 2013-07-22: qty 2

## 2013-07-22 MED ORDER — PANTOPRAZOLE SODIUM 40 MG IV SOLR
40.0000 mg | Freq: Every day | INTRAVENOUS | Status: DC
  Administered 2013-07-22 – 2013-07-23 (×2): 40 mg via INTRAVENOUS
  Filled 2013-07-22 (×2): qty 40

## 2013-07-22 MED ORDER — THIAMINE HCL 100 MG/ML IJ SOLN: 100.0000 mg | Freq: Every day | INTRAMUSCULAR | Status: DC

## 2013-07-22 MED ORDER — SODIUM CHLORIDE 0.9 % IJ SOLN
INTRAMUSCULAR | Status: AC
  Filled 2013-07-22: qty 1000

## 2013-07-22 MED ORDER — LORAZEPAM 1 MG PO TABS
0.0000 mg | ORAL_TABLET | Freq: Four times a day (QID) | ORAL | Status: DC
  Administered 2013-07-22 – 2013-07-23 (×2): 1 mg via ORAL
  Administered 2013-07-24: 2 mg via ORAL
  Filled 2013-07-22: qty 2
  Filled 2013-07-22 (×3): qty 1

## 2013-07-22 MED ORDER — IOHEXOL 300 MG/ML  SOLN
100.0000 mL | Freq: Once | INTRAMUSCULAR | Status: AC | PRN
  Administered 2013-07-22: 100 mL via INTRAVENOUS

## 2013-07-22 MED ORDER — LORAZEPAM 2 MG/ML IJ SOLN
1.0000 mg | Freq: Four times a day (QID) | INTRAMUSCULAR | Status: DC | PRN
  Administered 2013-07-23: 1 mg via INTRAVENOUS
  Filled 2013-07-22: qty 1

## 2013-07-22 MED ORDER — LORAZEPAM 1 MG PO TABS: 1.0000 mg | ORAL_TABLET | Freq: Four times a day (QID) | ORAL | Status: DC | PRN

## 2013-07-22 MED ORDER — LORAZEPAM 1 MG PO TABS: 0.0000 mg | ORAL_TABLET | Freq: Two times a day (BID) | ORAL | Status: DC

## 2013-07-22 MED ORDER — POTASSIUM CHLORIDE 10 MEQ/100ML IV SOLN
10.0000 meq | Freq: Once | INTRAVENOUS | Status: AC
  Administered 2013-07-22: 10 meq via INTRAVENOUS
  Filled 2013-07-22: qty 100

## 2013-07-22 MED ORDER — ACETAMINOPHEN 650 MG RE SUPP: 650.0000 mg | Freq: Four times a day (QID) | RECTAL | Status: DC | PRN

## 2013-07-22 MED ORDER — ENOXAPARIN SODIUM 40 MG/0.4ML ~~LOC~~ SOLN
40.0000 mg | SUBCUTANEOUS | Status: DC
  Administered 2013-07-22 – 2013-07-23 (×2): 40 mg via SUBCUTANEOUS
  Filled 2013-07-22 (×2): qty 0.4

## 2013-07-22 MED ORDER — SODIUM CHLORIDE 0.9 % IV BOLUS (SEPSIS): 500.0000 mL | Freq: Once | INTRAVENOUS | Status: DC

## 2013-07-22 MED ORDER — SODIUM CHLORIDE 0.9 % IV SOLN
INTRAVENOUS | Status: DC
  Administered 2013-07-22: 1000 mL via INTRAVENOUS
  Administered 2013-07-22: 18:00:00 via INTRAVENOUS

## 2013-07-22 MED ORDER — POTASSIUM CHLORIDE 10 MEQ/100ML IV SOLN
10.0000 meq | INTRAVENOUS | Status: AC
  Administered 2013-07-22 – 2013-07-23 (×4): 10 meq via INTRAVENOUS
  Filled 2013-07-22 (×2): qty 100

## 2013-07-22 MED ORDER — POTASSIUM CHLORIDE IN NACL 20-0.9 MEQ/L-% IV SOLN
INTRAVENOUS | Status: DC
  Administered 2013-07-22 – 2013-07-24 (×3): via INTRAVENOUS

## 2013-07-22 MED ORDER — NICOTINE 14 MG/24HR TD PT24
14.0000 mg | MEDICATED_PATCH | Freq: Every day | TRANSDERMAL | Status: DC
  Administered 2013-07-23: 14 mg via TRANSDERMAL
  Filled 2013-07-22: qty 1

## 2013-07-22 NOTE — ED Notes (Signed)
Pt c/o n/v/d and generalized body aches x 1 week. Denies abd pain.   Reports last Wednesday had "jet black" diarrhea.

## 2013-07-22 NOTE — ED Provider Notes (Signed)
CSN: [id number]     Arrival date & time 07/22/13  1644 History   First MD Initiated Contact with Patient 07/22/13 1649     Chief Complaint  Patient presents with  . Emesis  . Diarrhea    HPI Pt was seen at 1700. Per pt, c/o gradual onset and persistence of multiple intermittent episodes of N/V/D for the past 1 week. Describes the diarrhea as "black." States the "black" stool began after she took Pepto bismol. Has been associated with generalized body aches/fatigue and chills. Did not take her temperature at home. Has been unable to tol PO due to her symptoms. Pt has hx heavy etoh use with LD etoh "tried to take a sip 2 days ago and again last night and couldn't." Denies CP/palpitations, no SOB/cough, no abd pain, no back pain, no black or blood in emesis.     Past Medical History  Diagnosis Date  . Hypertension   . GERD (gastroesophageal reflux disease)   . Stroke 2008    left sided weakness  . Alcohol abuse   . Normal cardiac stress test 2008    myoview  . Hepatic steatosis   . Cholelithiasis    Past Surgical History  Procedure Laterality Date  . Esophagus stretched    . Appendectomy      History  Substance Use Topics  . Smoking status: Current Every Day Smoker  . Smokeless tobacco: Not on file  . Alcohol Use: Yes    Review of Systems ROS: Statement: All systems negative except as marked or noted in the HPI; Constitutional: Negative for fever and +chills, generalized weakness/fatigue.. ; ; Eyes: Negative for eye pain, redness and discharge. ; ; ENMT: Negative for ear pain, hoarseness, nasal congestion, sinus pressure and sore throat. ; ; Cardiovascular: Negative for chest pain, palpitations, diaphoresis, dyspnea and peripheral edema. ; ; Respiratory: Negative for cough, wheezing and stridor. ; ; Gastrointestinal: +N/V/D, "black stools." Negative for abdominal pain, blood in stool, hematemesis, jaundice and rectal bleeding. . ; ; Genitourinary: Negative for dysuria, flank pain  and hematuria. ; ; Musculoskeletal: Negative for back pain and neck pain. Negative for swelling and trauma.; ; Skin: Negative for pruritus, rash, abrasions, blisters, bruising and skin lesion.; ; Neuro: Negative for headache, lightheadedness and neck stiffness. Negative for altered level of consciousness , altered mental status, extremity weakness, paresthesias, involuntary movement, seizure and syncope.      Allergies  Review of patient's allergies indicates no known allergies.  Home Medications   Current Outpatient Rx  Name  Route  Sig  Dispense  Refill  . albuterol (PROVENTIL HFA;VENTOLIN HFA) 108 (90 BASE) MCG/ACT inhaler   Inhalation   Inhale 1-2 puffs into the lungs every 6 (six) hours as needed for wheezing.   1 Inhaler   0    BP 117/53  Pulse 136  Temp(Src) 100.1 F (37.8 C) (Oral)  SpO2 96% Physical Exam 1705: Physical examination:  Nursing notes reviewed; Vital signs and O2 SAT reviewed;  Constitutional: Well developed, Well nourished, In no acute distress; Head:  Normocephalic, atraumatic; Eyes: EOMI, PERRL, No scleral icterus; ENMT: Mouth and pharynx normal, Mucous membranes dry; Neck: Supple, Full range of motion, No lymphadenopathy; Cardiovascular: Regular rate and rhythm, No gallop; Respiratory: Breath sounds clear & equal bilaterally, No wheezes.  Speaking full sentences with ease, Normal respiratory effort/excursion; Chest: Nontender, Movement normal; Abdomen: Soft, Nontender, Nondistended, Normal bowel sounds. Rectal exam performed w/permission of pt and ED RN chaperone present.  Anal tone normal.  Non-tender, soft brown stool in rectal vault, heme neg.  No fissures, no external hemorrhoids, no palp masses.; Genitourinary: No CVA tenderness; Extremities: Pulses normal, No tenderness, No edema, No calf edema or asymmetry.; Neuro: AA&Ox3, Major CN grossly intact.  Speech clear. No gross focal motor or sensory deficits in extremities.; Skin: Color normal, Warm, Dry.   ED  Course  Procedures   EKG Interpretation     Ventricular Rate:  87 PR Interval:  168 QRS Duration: 88 QT Interval:  406 QTC Calculation: 488 R Axis:   16 Text Interpretation:  Normal sinus rhythm Nonspecific T wave abnormality Prolonged QT Abnormal ECG When compared with ECG of 24-Sep-2011 23:01, Nonspecific T wave abnormality no longer evident in Lateral leads            MDM  MDM Reviewed: nursing note, previous chart and vitals Reviewed previous: labs Interpretation: labs, x-ray and CT scan Total time providing critical care: 30-74 minutes. This excludes time spent performing separately reportable procedures and services. Consults: admitting MD   CRITICAL CARE Performed by: Laray Anger Total critical care time: 35 Critical care time was exclusive of separately billable procedures and treating other patients. Critical care was necessary to treat or prevent imminent or life-threatening deterioration. Critical care was time spent personally by me on the following activities: development of treatment plan with patient and/or surrogate as well as nursing, discussions with consultants, evaluation of patient's response to treatment, examination of patient, obtaining history from patient or surrogate, ordering and performing treatments and interventions, ordering and review of laboratory studies, ordering and review of radiographic studies, pulse oximetry and re-evaluation of patient's condition.   Results for orders placed during the hospital encounter of 07/22/13  CBC WITH DIFFERENTIAL      Result Value Range   WBC 8.7  4.0 - 10.5 K/uL   RBC 3.54 (*) 3.87 - 5.11 MIL/uL   Hemoglobin 13.8  12.0 - 15.0 g/dL   HCT 40.9  81.1 - 91.4 %   MCV 108.2 (*) 78.0 - 100.0 fL   MCH 39.0 (*) 26.0 - 34.0 pg   MCHC 36.0  30.0 - 36.0 g/dL   RDW 78.2  95.6 - 21.3 %   Platelets 139 (*) 150 - 400 K/uL   Neutrophils Relative % 84 (*) 43 - 77 %   Neutro Abs 7.3  1.7 - 7.7 K/uL    Lymphocytes Relative 8 (*) 12 - 46 %   Lymphs Abs 0.7  0.7 - 4.0 K/uL   Monocytes Relative 8  3 - 12 %   Monocytes Absolute 0.7  0.1 - 1.0 K/uL   Eosinophils Relative 0  0 - 5 %   Eosinophils Absolute 0.0  0.0 - 0.7 K/uL   Basophils Relative 0  0 - 1 %   Basophils Absolute 0.0  0.0 - 0.1 K/uL  COMPREHENSIVE METABOLIC PANEL      Result Value Range   Sodium 126 (*) 135 - 145 mEq/L   Potassium 3.0 (*) 3.5 - 5.1 mEq/L   Chloride 89 (*) 96 - 112 mEq/L   CO2 24  19 - 32 mEq/L   Glucose, Bld 120 (*) 70 - 99 mg/dL   BUN 18  6 - 23 mg/dL   Creatinine, Ser 0.86  0.50 - 1.10 mg/dL   Calcium 9.1  8.4 - 57.8 mg/dL   Total Protein 8.2  6.0 - 8.3 g/dL   Albumin 2.6 (*) 3.5 - 5.2 g/dL   AST 69 (*) 0 -  37 U/L   ALT 24  0 - 35 U/L   Alkaline Phosphatase 168 (*) 39 - 117 U/L   Total Bilirubin 1.9 (*) 0.3 - 1.2 mg/dL   GFR calc non Af Amer >90  >90 mL/min   GFR calc Af Amer >90  >90 mL/min  LIPASE, BLOOD      Result Value Range   Lipase 16  11 - 59 U/L  ETHANOL      Result Value Range   Alcohol, Ethyl (B) <11  0 - 11 mg/dL  PROTIME-INR      Result Value Range   Prothrombin Time 14.8  11.6 - 15.2 seconds   INR 1.19  0.00 - 1.49  OCCULT BLOOD, POC DEVICE      Result Value Range   Fecal Occult Bld NEGATIVE  NEGATIVE    Results for [name], [name] (MRN [id number]) as of 07/22/2013 18:57  Ref. Range 06/16/2008 22:45 11/30/2009 23:53 09/24/2011 22:43 07/22/2013 17:36  Sodium Latest Range: 135-145 mEq/L 134 (L) 134 (L) 133 (L) 126 (L)  Chloride Latest Range: 96-112 mEq/L 98 99 96 89 (L)  Alkaline Phosphatase Latest Range: 39-117 U/L 81   168 (H)  AST Latest Range: 0-37 U/L 64 (H)   69 (H)  ALT Latest Range: 0-35 U/L 15   24  Total Bilirubin Latest Range: 0.3-1.2 mg/dL 0.8   1.9 (H)     Dg Chest 1 View 07/22/2013   CLINICAL DATA:  Weakness.  Vomiting.  Diarrhea.  EXAM: CHEST - 1 VIEW  COMPARISON:  09/24/2011  FINDINGS: Very low lung volumes are noted, however both lungs are otherwise clear. No  evidence of pneumothorax or pleural effusion. Heart size within normal limits allowing for low lung volumes.  IMPRESSION: Very low lung volumes.  No acute findings.   Electronically Signed   By: Myles Rosenthal M.D.   On: 07/22/2013 20:11   Ct Abdomen Pelvis W Contrast 07/22/2013   CLINICAL DATA:  Nausea and vomiting. Slurred speech. Emesis. Diarrhea.  EXAM: CT ABDOMEN AND PELVIS WITH CONTRAST  TECHNIQUE: Multidetector CT imaging of the abdomen and pelvis was performed using the standard protocol following bolus administration of intravenous contrast.  CONTRAST:  50mL OMNIPAQUE IOHEXOL 300 MG/ML SOLN, OMNIPAQUE IOHEXOL 300 MG/ML SOLN  COMPARISON:  05/21/2007  FINDINGS: Lingular subsegmental atelectasis or scarring.  Lymph node adjacent to the distal thoracic esophagus, short axis diameter 1.3 cm, image 12 of series 2.  Right retrocrural lymph node short axis 0.7 cm, image 21 of series 2 (formerly 0.5 cm). Very pancreatic peripancreatic lymph node 0.9 cm, image 28 of series 2.  Contracted and thick-walled gallbladder with numerous internal calcifications favoring gallstones.  Pancreas, spleen, liver, and adrenal glands unremarkable.  Bosniak category 1, 3.1 by 2.7 cm cyst of the left kidney lower pole. 1.4 x 1.1 by 1.1 cm exophytic complex lesion of the left kidney lower pole as portal venous phase density of 68 Hounsfield units and delayed phase density of 67 Hounsfield units. No hydronephrosis or hydroureter. No calculi observed.  Fatty deposition in the wall of the ascending colon, similar to prior. Calcified uterine fibroid, reduced in size. Low position of the cervix and vagina favors pelvic floor laxity.  Appendix is absent.  Terminal ileum unremarkable.  IMPRESSION: 1. Complex exophytic lesion of the left kidney lower pole does not change in density between portal venous and delayed phase images, and accordingly is likely a Bosniak category 2 cyst (complex but benign). 2. Left renal scarring  3. Contracted  and thick-walled gallbladder with numerous internal calcifications favoring multiple gallstones, significantly increased in number compared to prior exam. 4. Lingular subsegmental atelectasis or scarring. 5. Mildly prominent lymph node near the hiatus, potentially reactive due to local inflammation, but technically nonspecific. 6. Fatty deposition in the ascending colon wall is a normal variant appearance although can sometimes be associated with inflammatory bowel disease. 7. Reduced size of calcified uterine fibroid. 8. Pelvic floor laxity, with low position of the cervix.   Electronically Signed   By: Herbie Baltimore M.D.   On: 07/22/2013 20:17     2020:  Pt unable to tol PO without N/V. Pt hyponatremic compared to previous labs; judicious IVF given. Question etoh withdrawal as cause for tachycardia; IV ativan given. Dx and testing d/w pt and family.  Questions answered.  Verb understanding, agreeable to admit.  T/C to [facility] Dr. Rito Ehrlich, case discussed, including:  HPI, pertinent PM/SHx, VS/PE, dx testing, ED course and treatment:  Agreeable to admit, requests to write temporary orders, obtain tele bed.   Laray Anger, DO 07/23/13 2006

## 2013-07-22 NOTE — H&P (Signed)
[facility] History and Physical  HARMAN FERRIN ZOX:[id number] DOB: Apr 06, 1956 DOA: 07/22/2013   PCP: Vertis Kelch, NP  Specialists: She does not see any specialist  Chief Complaint: Nausea, vomiting, diarrhea since last week  HPI: [name] is a 57 y.o. female who denies any health problems except for a TIA in the remote past, who is a very poor historian. She is accompanied by her daughter, who provides some of the history. Apparently, patient was doing well up until last Monday, when she started having nausea and vomiting. However, it sounds like it's been mainly nausea, with only a few episodes of emesis, which has been watery. Denies any blood in the emesis. She denies any abdominal pain. No dysuria, but has been having watery diarrhea on and off since last week. She has had some chills. Had low grade fever with temperature up to 100.7. She's had dry mouth. She has been unsteady in her gait according to the daughter. Denies any syncopal episode, but has been dizzy. Denies eating out anywhere. Denies eating any new foods or any salads recently. She had black bowel movement yesterday, but has been taking Pepto-Bismol, which could explain the black stool. Denies any sick contacts, but since she has been sick as her husband has similar symptoms. She also admits to drinking alcohol on a daily basis.  Home Medications: Prior to Admission medications   Medication Sig Start Date End Date Taking? Authorizing Provider  albuterol (PROVENTIL HFA;VENTOLIN HFA) 108 (90 BASE) MCG/ACT inhaler Inhale 1-2 puffs into the lungs every 6 (six) hours as needed for wheezing. 09/25/11 07/22/13 Yes [name], MD    Allergies: No Known Allergies  Past Medical History: Past Medical History  Diagnosis Date  . Hypertension   . GERD (gastroesophageal reflux disease)   . Stroke 2008    left sided weakness  . Alcohol abuse   . Normal cardiac stress test 2008    myoview  . Hepatic steatosis   .  Cholelithiasis     Past Surgical History  Procedure Laterality Date  . Esophagus stretched    . Appendectomy      Social History: Patient lives in [place] with her husband. She smokes one half pack of cigarettes on a daily basis. Has 2 drinks of Vodka on a daily basis and more so on the weekends. Denies any drug use.  Family History: history of cancer in her mother  Review of Systems - difficult to obtain as the patient seemed confused at times.  Physical Examination  Filed Vitals:   07/22/13 1830 07/22/13 1900 07/22/13 1930 07/22/13 2041  BP: 97/73 93/48 90/49  103/70  Pulse: 25 89 74 88  Temp:      TempSrc:      Resp:    16  SpO2: 88% 94% 84% 95%    General appearance: alert, cooperative, distracted and no distress Head: Normocephalic, without obvious abnormality, atraumatic Eyes: conjunctivae/corneas clear. PERRL, EOM's intact.  Throat: dry mm Neck: no adenopathy, no carotid bruit, no JVD, supple, symmetrical, trachea midline and thyroid not enlarged, symmetric, no tenderness/mass/nodules Resp: clear to auscultation bilaterally Cardio: regular rate and rhythm, S1, S2 normal, no murmur, click, rub or gallop GI: soft, non-tender; bowel sounds normal; no masses,  no organomegaly Extremities: extremities normal, atraumatic, no cyanosis or edema Pulses: 2+ and symmetric Skin: Skin color, texture, turgor normal. No rashes or lesions Lymph nodes: Cervical, supraclavicular, and axillary nodes normal. Neurologic: She is alert. No cranial assist. No focal neurological deficits are  noted. Generalized weakness is appreciated.  Laboratory Data: Results for orders placed during the hospital encounter of 07/22/13 (from the past 48 hour(s))  OCCULT BLOOD, POC DEVICE     Status: None   Collection Time    07/22/13  5:09 PM      Result Value Range   Fecal Occult Bld NEGATIVE  NEGATIVE  CBC WITH DIFFERENTIAL     Status: Abnormal   Collection Time    07/22/13  5:36 PM      Result  Value Range   WBC 8.7  4.0 - 10.5 K/uL   RBC 3.54 (*) 3.87 - 5.11 MIL/uL   Hemoglobin 13.8  12.0 - 15.0 g/dL   HCT 16.1  09.6 - 04.5 %   MCV 108.2 (*) 78.0 - 100.0 fL   MCH 39.0 (*) 26.0 - 34.0 pg   MCHC 36.0  30.0 - 36.0 g/dL   RDW 40.9  81.1 - 91.4 %   Platelets 139 (*) 150 - 400 K/uL   Neutrophils Relative % 84 (*) 43 - 77 %   Neutro Abs 7.3  1.7 - 7.7 K/uL   Lymphocytes Relative 8 (*) 12 - 46 %   Lymphs Abs 0.7  0.7 - 4.0 K/uL   Monocytes Relative 8  3 - 12 %   Monocytes Absolute 0.7  0.1 - 1.0 K/uL   Eosinophils Relative 0  0 - 5 %   Eosinophils Absolute 0.0  0.0 - 0.7 K/uL   Basophils Relative 0  0 - 1 %   Basophils Absolute 0.0  0.0 - 0.1 K/uL  COMPREHENSIVE METABOLIC PANEL     Status: Abnormal   Collection Time    07/22/13  5:36 PM      Result Value Range   Sodium 126 (*) 135 - 145 mEq/L   Potassium 3.0 (*) 3.5 - 5.1 mEq/L   Chloride 89 (*) 96 - 112 mEq/L   CO2 24  19 - 32 mEq/L   Glucose, Bld 120 (*) 70 - 99 mg/dL   BUN 18  6 - 23 mg/dL   Creatinine, Ser 7.82  0.50 - 1.10 mg/dL   Calcium 9.1  8.4 - 95.6 mg/dL   Total Protein 8.2  6.0 - 8.3 g/dL   Albumin 2.6 (*) 3.5 - 5.2 g/dL   AST 69 (*) 0 - 37 U/L   ALT 24  0 - 35 U/L   Alkaline Phosphatase 168 (*) 39 - 117 U/L   Total Bilirubin 1.9 (*) 0.3 - 1.2 mg/dL   GFR calc non Af Amer >90  >90 mL/min   GFR calc Af Amer >90  >90 mL/min   Comment: (NOTE)     The eGFR has been calculated using the CKD EPI equation.     This calculation has not been validated in all clinical situations.     eGFR's persistently <90 mL/min signify possible Chronic Kidney     Disease.  LIPASE, BLOOD     Status: None   Collection Time    07/22/13  5:36 PM      Result Value Range   Lipase 16  11 - 59 U/L  ETHANOL     Status: None   Collection Time    07/22/13  5:36 PM      Result Value Range   Alcohol, Ethyl (B) <11  0 - 11 mg/dL   Comment:            LOWEST DETECTABLE LIMIT FOR  SERUM ALCOHOL IS 11 mg/dL     FOR MEDICAL PURPOSES  ONLY  PROTIME-INR     Status: None   Collection Time    07/22/13  5:36 PM      Result Value Range   Prothrombin Time 14.8  11.6 - 15.2 seconds   INR 1.19  0.00 - 1.49  TROPONIN I     Status: None   Collection Time    07/22/13  5:36 PM      Result Value Range   Troponin I <0.30  <0.30 ng/mL   Comment:            Due to the release kinetics of cTnI,     a negative result within the first hours     of the onset of symptoms does not rule out     myocardial infarction with certainty.     If myocardial infarction is still suspected,     repeat the test at appropriate intervals.    Radiology Reports: Dg Chest 1 View  07/22/2013   CLINICAL DATA:  Weakness.  Vomiting.  Diarrhea.  EXAM: CHEST - 1 VIEW  COMPARISON:  09/24/2011  FINDINGS: Very low lung volumes are noted, however both lungs are otherwise clear. No evidence of pneumothorax or pleural effusion. Heart size within normal limits allowing for low lung volumes.  IMPRESSION: Very low lung volumes.  No acute findings.   Electronically Signed   By: Myles Rosenthal M.D.   On: 07/22/2013 20:11   Ct Abdomen Pelvis W Contrast  07/22/2013   CLINICAL DATA:  Nausea and vomiting. Slurred speech. Emesis. Diarrhea.  EXAM: CT ABDOMEN AND PELVIS WITH CONTRAST  TECHNIQUE: Multidetector CT imaging of the abdomen and pelvis was performed using the standard protocol following bolus administration of intravenous contrast.  CONTRAST:  50mL OMNIPAQUE IOHEXOL 300 MG/ML SOLN, OMNIPAQUE IOHEXOL 300 MG/ML SOLN  COMPARISON:  05/21/2007  FINDINGS: Lingular subsegmental atelectasis or scarring.  Lymph node adjacent to the distal thoracic esophagus, short axis diameter 1.3 cm, image 12 of series 2.  Right retrocrural lymph node short axis 0.7 cm, image 21 of series 2 (formerly 0.5 cm). Very pancreatic peripancreatic lymph node 0.9 cm, image 28 of series 2.  Contracted and thick-walled gallbladder with numerous internal calcifications favoring gallstones.  Pancreas,  spleen, liver, and adrenal glands unremarkable.  Bosniak category 1, 3.1 by 2.7 cm cyst of the left kidney lower pole. 1.4 x 1.1 by 1.1 cm exophytic complex lesion of the left kidney lower pole as portal venous phase density of 68 Hounsfield units and delayed phase density of 67 Hounsfield units. No hydronephrosis or hydroureter. No calculi observed.  Fatty deposition in the wall of the ascending colon, similar to prior. Calcified uterine fibroid, reduced in size. Low position of the cervix and vagina favors pelvic floor laxity.  Appendix is absent.  Terminal ileum unremarkable.  IMPRESSION: 1. Complex exophytic lesion of the left kidney lower pole does not change in density between portal venous and delayed phase images, and accordingly is likely a Bosniak category 2 cyst (complex but benign). 2. Left renal scarring 3. Contracted and thick-walled gallbladder with numerous internal calcifications favoring multiple gallstones, significantly increased in number compared to prior exam. 4. Lingular subsegmental atelectasis or scarring. 5. Mildly prominent lymph node near the hiatus, potentially reactive due to local inflammation, but technically nonspecific. 6. Fatty deposition in the ascending colon wall is a normal variant appearance although can sometimes be associated with inflammatory bowel disease. 7. Reduced  size of calcified uterine fibroid. 8. Pelvic floor laxity, with low position of the cervix.   Electronically Signed   By: Herbie Baltimore M.D.   On: 07/22/2013 20:17    Electrocardiogram: Sinus rhythm at 87 beats per minute. Normal axis. Intervals or normal. No Q waves. No concerning ST or T-wave changes are noted.  Problem List  Principal Problem:   Acute gastroenteritis Active Problems:   Alcohol abuse   Hyponatremia   Hypokalemia   Dehydration   Macrocytosis   Cholelithiasis   Renal cyst   Assessment: This is a 57 year old, Caucasian female, presents with a one-week history of nausea,  vomiting, and diarrhea. She appears to have gastroenteritis. She also has hyponatremia and hypokalemia, probably as a result of the above. She's dehydrated. She does have a history of alcohol abuse and may be exhibiting early signs of alcohol withdrawal. She is found to have gallstones on CT however, does not have any tenderness in the right upper quadrant. She has other incidental findings noted on CT as well  Plan: #1 acute gastroenteritis: Will check GI pathogen panel. We'll give her IV fluids. Treat her symptomatically. Even though she mentioned black stool she was heme-negative according to the ED physician. And has been taking Pepto-Bismol, which could account for black stools.  #2 dehydration with hypokalemia and hyponatremia: She'll be given IV fluids. Potassium will be repleted. Check magnesium. She had  low blood pressure after she was given Ativan, which has improved after IV fluids.  #3 alcohol abuse as possible early withdrawal: She'll be placed on the alcohol withdrawal protocol. She'll be given thiamine. Counseling will need to be provided.  #4 tobacco abuse: Nicotine patch will be prescribed. She will need counseling as well.  #5 cholelithiasis: Will get ultrasound to better characterize the gallbladder and to get a look at the liver as well. She does have mildly elevated AST and bilirubin which is most likely due to her alcohol intake.  #6 renal cyst: This is thought to be complex, but benign. This will need outpatient followup.  #7 other nonspecific findings on CT: She will require outpatient followup for the same.  #8 macrocytosis: Will check TSH and B12 levels.   DVT Prophylaxis: Lovenox Code Status: Full code Family Communication: Discussed with the patient and her daughter  Disposition Plan: Admit to telemetry   Further management decisions will depend on results of further testing and patient's response to treatment.  [facility]  [facility] Pager  [phone]  If 7PM-7AM, please contact night-coverage [email or web address] Password [email or web address]  07/22/2013, 10:03 PM

## 2013-07-23 ENCOUNTER — Inpatient Hospital Stay (HOSPITAL_COMMUNITY): Payer: Self-pay

## 2013-07-23 ENCOUNTER — Encounter (HOSPITAL_COMMUNITY): Payer: Self-pay | Admitting: *Deleted

## 2013-07-23 DIAGNOSIS — A599 Trichomoniasis, unspecified: Secondary | ICD-10-CM | POA: Diagnosis present

## 2013-07-23 DIAGNOSIS — F1010 Alcohol abuse, uncomplicated: Secondary | ICD-10-CM

## 2013-07-23 DIAGNOSIS — K810 Acute cholecystitis: Secondary | ICD-10-CM | POA: Diagnosis present

## 2013-07-23 LAB — CBC
HCT: 41.1 % (ref 36.0–46.0)
MCHC: 34.8 g/dL (ref 30.0–36.0)
MCV: 111.1 fL — ABNORMAL HIGH (ref 78.0–100.0)
Platelets: 149 10*3/uL — ABNORMAL LOW (ref 150–400)
RBC: 3.7 MIL/uL — ABNORMAL LOW (ref 3.87–5.11)
WBC: 8.8 10*3/uL (ref 4.0–10.5)

## 2013-07-23 LAB — COMPREHENSIVE METABOLIC PANEL
ALT: 23 U/L (ref 0–35)
Albumin: 2.5 g/dL — ABNORMAL LOW (ref 3.5–5.2)
BUN: 13 mg/dL (ref 6–23)
CO2: 25 mEq/L (ref 19–32)
Creatinine, Ser: 0.61 mg/dL (ref 0.50–1.10)
GFR calc Af Amer: >90 mL/min (ref >90)
Total Bilirubin: 1.9 mg/dL — ABNORMAL HIGH (ref 0.3–1.2)
Total Protein: 8 g/dL (ref 6.0–8.3)

## 2013-07-23 LAB — URINALYSIS W MICROSCOPIC + REFLEX CULTURE
Bilirubin Urine: NEGATIVE
Hgb urine dipstick: NEGATIVE
Ketones, ur: NEGATIVE mg/dL
Nitrite: NEGATIVE
Protein, ur: NEGATIVE mg/dL
Urobilinogen, UA: 4 mg/dL — ABNORMAL HIGH (ref 0.0–1.0)

## 2013-07-23 LAB — RAPID URINE DRUG SCREEN, HOSP PERFORMED
Amphetamines: NOT DETECTED
Benzodiazepines: NOT DETECTED
Cocaine: NOT DETECTED
Opiates: NOT DETECTED
Tetrahydrocannabinol: NOT DETECTED

## 2013-07-23 LAB — HEPATITIS PANEL, ACUTE: Hep B C IgM: NONREACTIVE

## 2013-07-23 MED ORDER — MAGNESIUM SULFATE IN D5W 10-5 MG/ML-% IV SOLN
INTRAVENOUS | Status: AC
  Filled 2013-07-23: qty 100

## 2013-07-23 MED ORDER — LORAZEPAM 2 MG/ML IJ SOLN: 1.0000 mg | Freq: Once | INTRAMUSCULAR | Status: AC

## 2013-07-23 MED ORDER — MORPHINE SULFATE 2 MG/ML IJ SOLN
1.0000 mg | INTRAMUSCULAR | Status: DC | PRN
  Administered 2013-07-23: 1 mg via INTRAVENOUS
  Filled 2013-07-23: qty 1

## 2013-07-23 MED ORDER — MAGNESIUM SULFATE IN D5W 10-5 MG/ML-% IV SOLN
1.0000 g | Freq: Once | INTRAVENOUS | Status: AC
  Administered 2013-07-23: 1 g via INTRAVENOUS
  Filled 2013-07-23: qty 100

## 2013-07-23 NOTE — Progress Notes (Signed)
Updated pt's family (son) on pt's condition by telephone.    Maryln Manuel, MD

## 2013-07-23 NOTE — Progress Notes (Signed)
UR chart review completed.  

## 2013-07-23 NOTE — Progress Notes (Signed)
Reviewed [place] results with patient and her children.  Pt is asking to go home.  I asked pt to please stay so that she can be evaluated by general surgery.  I asked for gen surgery consult.  Pt says that she would stay.  Pt made NPO.    Maryln Manuel, MD

## 2013-07-23 NOTE — Evaluation (Signed)
Occupational Therapy Evaluation Patient Details Name: [name] MRN: [id number] DOB: 03/25/1956 Today's Date: 07/23/2013 Time: 4696-2952 OT Time Calculation (min): 25 min Evaluation Only  OT Assessment / Plan / Recommendation History of present illness Patient is a 57 yo female admitted with several day history of nausea, vomitting.  Diagnosed with acute gastroenteritis, dehydration with hypokalemia and hyponatremia.  Referral made for OT to evaluate and treat.     Clinical Impression   Patient presently with complaint of significant pain in bilateral LE and generalized aches.  Patient demos ability to complete all ADL tasks attempted this date with increased time and assistance only due to IV.  Patient with weakness in bilateral UE which patient states is normal for her.  She states she does exercise at home, encouraged patient to continue upon discharge.     OT Assessment  Patient does not need any further OT services    Follow Up Recommendations  No OT follow up    Barriers to Discharge  good family support              Precautions / Restrictions Precautions Precautions: Fall Restrictions Weight Bearing Restrictions: No    ADL  Eating/Feeding: Modified independent Grooming: Wash/dry hands;Wash/dry face;Modified independent Where Assessed - Grooming: Unsupported standing Upper Body Dressing: Set up Where Assessed - Upper Body Dressing: Unsupported sitting Lower Body Dressing: Set up Toilet Transfer Equipment: Bedside commode Toileting - Clothing Manipulation and Hygiene: Modified independent ADL Comments: patient required increased time for tasks however able to perform independently     OT Diagnosis:  generalized weakness   OT Goals(Current goals can be found in the care plan section) Acute Rehab OT Goals Patient Stated Goal: to not hurt and go back home  Visit Information  History of Present Illness: Patient is a 57 yo female admitted with several day  history of nausea, vomitting.  Diagnosed with acute gastroenteritis, dehydration with hypokalemia and hyponatremia.  Referral made for OT to evaluate and treat.         Prior Functioning     Home Living Family/patient expects to be discharged to:: Private residence Living Arrangements: Spouse/significant other;Children Available Help at Discharge: Family Type of Home: Mobile home Home Access: Stairs to enter Secretary/administrator of Steps: 4 Entrance Stairs-Rails: Right;Left;Can reach both Home Layout: One level Home Equipment: None Prior Function Level of Independence: Independent Communication Communication: No difficulties Dominant Hand: Right         Vision/Perception Vision - History Patient Visual Report: No change from baseline Vision - Assessment Eye Alignment: Within Functional Limits   Cognition  Cognition Arousal/Alertness: Awake/alert Behavior During Therapy: WFL for tasks assessed/performed;Flat affect Overall Cognitive Status: Within Functional Limits for tasks assessed    Extremity/Trunk Assessment Upper Extremity Assessment Upper Extremity Assessment: Overall WFL for tasks assessed;Generalized weakness Lower Extremity Assessment Lower Extremity Assessment: Defer to PT evaluation     Mobility Bed Mobility Bed Mobility: Rolling Left Rolling Left: 6: Modified independent (Device/Increase time);With rail Transfers Transfers: Sit to Stand;Stand to Sit Sit to Stand: 7: Independent Stand to Sit: 7: Independent Details for Transfer Assistance: transferred from bed to recliner with no use of AE              End of Session OT - End of Session Equipment Utilized During Treatment: Gait belt Activity Tolerance: Patient tolerated treatment well Patient left: in chair;with call bell/phone within reach;with chair alarm set      Velora Mediate, OTR/L  07/23/2013, 9:26 AM

## 2013-07-23 NOTE — Care Management Note (Addendum)
    Page 1 of 1   07/24/2013     9:30:09 AM   CARE MANAGEMENT NOTE 07/24/2013  Patient:  GENIEVE, RAMASWAMY   Account Number:  [id number]  Date Initiated:  07/23/2013  Documentation initiated by:  Sharrie Rothman  Subjective/Objective Assessment:   Pt admitted from home with acute gastroenteritis. Pt lives with her husband and will return home at discharge. Pt receives PCP care at [facility]. Pt stated her husband pays for her medications. Pt is independent with ADL's.     Action/Plan:   No Cm needs noted. Pt may need MATCH voucher at discharge.Will continue to follow.   Anticipated DC Date:  07/25/2013   Anticipated DC Plan:  HOME/SELF CARE      DC Planning Services  CM consult      Choice offered to / List presented to:             Status of service:  Completed, signed off Medicare Important Message given?   (If response is "NO", the following Medicare IM given date fields will be blank) Date Medicare IM given:   Date Additional Medicare IM given:    Discharge Disposition:  HOME/SELF CARE  Per UR Regulation:    If discussed at Long Length of Stay Meetings, dates discussed:    Comments:  07/24/13 0930 Arlyss Queen, RN BSN CM Pt discharged home today. No CM needs noted.  07/23/13 1150 Arlyss Queen, RN BSN CM

## 2013-07-23 NOTE — Progress Notes (Signed)
[facility] PROGRESS NOTE  [name] ZOX:[id number] DOB: 17-Aug-1956 DOA: 07/22/2013 PCP: Vertis Kelch, NP  Assessment/Plan: 1. Acute gastroenteritis - improving with IVFs, attempt to advance diet today 2. Dehydration - continue IVFs, much improved clinically 3. Alcohol abuse - withdrawal protocol, Thiamine 4. Cholelithiasis - Abd [place] pending 5. Macrocytosis - TSH and B12 level pending 6. Hypokalemia - resolved after treatment, Mg WNL  Code Status:Full  Disposition Plan:  Likely home tomorrow if able to tolerate diet   HPI/Subjective: Pt reporting that she is having less diarrhea.  She would like to try to advance her diet today.    Objective: Filed Vitals:   07/23/13 0646  BP: 140/61  Pulse:   Temp: 97.6 F (36.4 C)  Resp: 16    Intake/Output Summary (Last 24 hours) at 07/23/13 1122 Last data filed at 07/23/13 0916  Gross per 24 hour  Intake    120 ml  Output    350 ml  Net   -230 ml   There were no vitals filed for this visit.  Exam:   General:  Awake, drowsy, no distress  Cardiovascular: normal s1, s2 sounds   Respiratory: BBS clear to ausculation   Abdomen: soft, nondistended, nontender  Musculoskeletal: moving all extremities   Data Reviewed: Basic Metabolic Panel:  Recent Labs Lab 07/22/13 1736 07/22/13 2201 07/23/13 0455  NA 126*  --  135  K 3.0*  --  3.8  CL 89*  --  97  CO2 24  --  25  GLUCOSE 120*  --  90  BUN 18  --  13  CREATININE 0.70  --  0.61  CALCIUM 9.1  --  8.6  MG  --  1.6  --    Liver Function Tests:  Recent Labs Lab 07/22/13 1736 07/23/13 0455  AST 69* 61*  ALT 24 23  ALKPHOS 168* 163*  BILITOT 1.9* 1.9*  PROT 8.2 8.0  ALBUMIN 2.6* 2.5*    Recent Labs Lab 07/22/13 1736  LIPASE 16   No results found for this basename: AMMONIA,  in the last 168 hours CBC:  Recent Labs Lab 07/22/13 1736 07/23/13 0455  WBC 8.7 8.8  NEUTROABS 7.3  --   HGB 13.8 14.3  HCT 38.3 41.1  MCV 108.2* 111.1*  PLT  139* 149*   Cardiac Enzymes:  Recent Labs Lab 07/22/13 1736  TROPONINI <0.30   BNP (last 3 results) No results found for this basename: PROBNP,  in the last 8760 hours CBG: No results found for this basename: GLUCAP,  in the last 168 hours  No results found for this or any previous visit (from the past 240 hour(s)).   Studies: Dg Chest 1 View  07/22/2013   CLINICAL DATA:  Weakness.  Vomiting.  Diarrhea.  EXAM: CHEST - 1 VIEW  COMPARISON:  09/24/2011  FINDINGS: Very low lung volumes are noted, however both lungs are otherwise clear. No evidence of pneumothorax or pleural effusion. Heart size within normal limits allowing for low lung volumes.  IMPRESSION: Very low lung volumes.  No acute findings.   Electronically Signed   By: Myles Rosenthal M.D.   On: 07/22/2013 20:11   Ct Abdomen Pelvis W Contrast  07/22/2013   CLINICAL DATA:  Nausea and vomiting. Slurred speech. Emesis. Diarrhea.  EXAM: CT ABDOMEN AND PELVIS WITH CONTRAST  TECHNIQUE: Multidetector CT imaging of the abdomen and pelvis was performed using the standard protocol following bolus administration of intravenous contrast.  CONTRAST:  50mL OMNIPAQUE  IOHEXOL 300 MG/ML SOLN, OMNIPAQUE IOHEXOL 300 MG/ML SOLN  COMPARISON:  05/21/2007  FINDINGS: Lingular subsegmental atelectasis or scarring.  Lymph node adjacent to the distal thoracic esophagus, short axis diameter 1.3 cm, image 12 of series 2.  Right retrocrural lymph node short axis 0.7 cm, image 21 of series 2 (formerly 0.5 cm). Very pancreatic peripancreatic lymph node 0.9 cm, image 28 of series 2.  Contracted and thick-walled gallbladder with numerous internal calcifications favoring gallstones.  Pancreas, spleen, liver, and adrenal glands unremarkable.  Bosniak category 1, 3.1 by 2.7 cm cyst of the left kidney lower pole. 1.4 x 1.1 by 1.1 cm exophytic complex lesion of the left kidney lower pole as portal venous phase density of 68 Hounsfield units and delayed phase density of 67  Hounsfield units. No hydronephrosis or hydroureter. No calculi observed.  Fatty deposition in the wall of the ascending colon, similar to prior. Calcified uterine fibroid, reduced in size. Low position of the cervix and vagina favors pelvic floor laxity.  Appendix is absent.  Terminal ileum unremarkable.  IMPRESSION: 1. Complex exophytic lesion of the left kidney lower pole does not change in density between portal venous and delayed phase images, and accordingly is likely a Bosniak category 2 cyst (complex but benign). 2. Left renal scarring 3. Contracted and thick-walled gallbladder with numerous internal calcifications favoring multiple gallstones, significantly increased in number compared to prior exam. 4. Lingular subsegmental atelectasis or scarring. 5. Mildly prominent lymph node near the hiatus, potentially reactive due to local inflammation, but technically nonspecific. 6. Fatty deposition in the ascending colon wall is a normal variant appearance although can sometimes be associated with inflammatory bowel disease. 7. Reduced size of calcified uterine fibroid. 8. Pelvic floor laxity, with low position of the cervix.   Electronically Signed   By: Herbie Baltimore M.D.   On: 07/22/2013 20:17    Scheduled Meds: . enoxaparin (LOVENOX) injection  40 mg Subcutaneous Q24H  . folic acid  1 mg Oral Daily  . LORazepam  0-4 mg Oral Q6H   Followed by  . [START ON 07/24/2013] LORazepam  0-4 mg Oral Q12H  . multivitamin with minerals  1 tablet Oral Daily  . nicotine  14 mg Transdermal Daily  . pantoprazole (PROTONIX) IV  40 mg Intravenous QHS  . sodium chloride  3 mL Intravenous Q12H  . thiamine  100 mg Oral Daily   Or  . thiamine  100 mg Intravenous Daily   Continuous Infusions: . 0.9 % NaCl with KCl 20 mEq / L 125 mL/hr at 07/22/13 2338    Principal Problem:   Acute gastroenteritis Active Problems:   Alcohol abuse   Hyponatremia   Hypokalemia   Dehydration   Macrocytosis    Cholelithiasis   Renal cyst   [facility]  [facility] Pager [phone]. If 7PM-7AM, please contact night-coverage at [email or web address], password [facility] 07/23/2013, 11:22 AM  LOS: 1 day

## 2013-07-23 NOTE — Evaluation (Signed)
Physical Therapy Evaluation Patient Details Name: [name] MRN: [id number] DOB: 02/24/1956 Today's Date: 07/23/2013 Time: 0930-1002 PT Time Calculation (min): 32 min  PT Assessment / Plan / Recommendation History of Present Illness  Patient is a 57 yo female admitted with several day history of nausea, vomitting.  Diagnosed with acute gastroenteritis, dehydration with hypokalemia and hyponatremia.  Referral made for PT to evaluate and treat.    Clinical Impression  Pt has been ill for a week and therefore feels weak and is slow to move but does not need skilled PT at this time.  Pt is modified I with activity she will need to slowly increase her activity level.    PT Assessment  Patent does not need any further PT services    Follow Up Recommendations  No PT follow up    Does the patient have the potential to tolerate intense rehabilitation    N/a  Barriers to Discharge  none      Equipment Recommendations  None recommended by PT    Recommendations for Other Services  none  Frequency      Precautions / Restrictions Precautions Precautions: None Restrictions Weight Bearing Restrictions: No   Pertinent Vitals/Pain 9/10 body aches.     Mobility  Bed Mobility Bed Mobility: Sit to Supine Rolling Left: 6: Modified independent (Device/Increase time);With rail Sit to Supine: 6: Modified independent (Device/Increase time) Transfers Transfers: Sit to Stand Sit to Stand: 6: Modified independent (Device/Increase time) Stand to Sit: 7: Independent Details for Transfer Assistance: transferred from bed to recliner with no use of AE  Ambulation/Gait Ambulation/Gait Assistance: 6: Modified independent (Device/Increase time) Ambulation Distance (Feet): 30 Feet Ambulation/Gait Assistance Details: able to stand at sink to wash face and brush teeth Gait Pattern: Decreased step length - right;Decreased step length - left Gait velocity: slow pt generally not feeling well     Exercises General Exercises - Lower Extremity Straight Leg Raises: 10 reps Hip Flexion/Marching: 10 reps Mini-Sqauts: 10 reps   PT Diagnosis:    PT Problem List:   PT Treatment Interventions:       PT Goals(Current goals can be found in the care plan section) Acute Rehab PT Goals Patient Stated Goal: to not hurt and go back home PT Goal Formulation: No goals set, d/c therapy  Visit Information  Last PT Received On: 07/23/13 History of Present Illness: Patient is a 57 yo female admitted with several day history of nausea, vomitting.  Diagnosed with acute gastroenteritis, dehydration with hypokalemia and hyponatremia.  Referral made for PT to evaluate and treat.         Prior Functioning  Home Living Family/patient expects to be discharged to:: Private residence Living Arrangements: Spouse/significant other;Children Available Help at Discharge: Family Type of Home: Mobile home Home Access: Stairs to enter Secretary/administrator of Steps: 4 Entrance Stairs-Rails: Right;Left;Can reach both Home Layout: One level Home Equipment: None Prior Function Level of Independence: Independent Communication Communication: No difficulties Dominant Hand: Right    Cognition  Cognition Arousal/Alertness: Awake/alert Behavior During Therapy: WFL for tasks assessed/performed;Flat affect Overall Cognitive Status: Within Functional Limits for tasks assessed    Extremity/Trunk Assessment Upper Extremity Assessment Upper Extremity Assessment: Overall WFL for tasks assessed;Generalized weakness Lower Extremity Assessment Lower Extremity Assessment: Generalized weakness RLE:  (hip mm 2-/5; knee 3/5) LLE:  (hip mm 3/5 knee 3+)   Balance    End of Session PT - End of Session Equipment Utilized During Treatment: Gait belt Patient left: in bed;with call bell/phone  within reach  GP     RUSSELL,CINDY 07/23/2013, 10:02 AM

## 2013-07-24 DIAGNOSIS — E876 Hypokalemia: Secondary | ICD-10-CM

## 2013-07-24 LAB — LIPID PANEL
Cholesterol: 104 mg/dL (ref 0–200)
HDL: 4 mg/dL — ABNORMAL LOW (ref >39)
Total CHOL/HDL Ratio: 26 RATIO
Triglycerides: 147 mg/dL (ref <150)
VLDL: 29 mg/dL (ref 0–40)

## 2013-07-24 LAB — COMPREHENSIVE METABOLIC PANEL
ALT: 19 U/L (ref 0–35)
Alkaline Phosphatase: 134 U/L — ABNORMAL HIGH (ref 39–117)
BUN: 7 mg/dL (ref 6–23)
CO2: 22 mEq/L (ref 19–32)
Chloride: 98 mEq/L (ref 96–112)
GFR calc Af Amer: >90 mL/min (ref >90)
GFR calc non Af Amer: >90 mL/min (ref >90)
Glucose, Bld: 89 mg/dL (ref 70–99)
Potassium: 3.6 mEq/L (ref 3.5–5.1)
Total Bilirubin: 1.6 mg/dL — ABNORMAL HIGH (ref 0.3–1.2)
Total Protein: 6.5 g/dL (ref 6.0–8.3)

## 2013-07-24 LAB — URINE CULTURE

## 2013-07-24 LAB — CBC
HCT: 35 % — ABNORMAL LOW (ref 36.0–46.0)
Hemoglobin: 12.1 g/dL (ref 12.0–15.0)
MCHC: 34.6 g/dL (ref 30.0–36.0)
RBC: 3.18 MIL/uL — ABNORMAL LOW (ref 3.87–5.11)
WBC: 5.9 10*3/uL (ref 4.0–10.5)

## 2013-07-24 LAB — AMYLASE: Amylase: 17 U/L (ref 0–105)

## 2013-07-24 MED ORDER — THIAMINE HCL 100 MG PO TABS: 100.0000 mg | ORAL_TABLET | Freq: Every day | ORAL | Status: DC

## 2013-07-24 MED ORDER — ADULT MULTIVITAMIN W/MINERALS CH: 1.0000 | ORAL_TABLET | Freq: Every day | ORAL | Status: DC

## 2013-07-24 MED ORDER — NICOTINE 14 MG/24HR TD PT24: 14.0000 mg | MEDICATED_PATCH | Freq: Every day | TRANSDERMAL | Status: DC

## 2013-07-24 MED ORDER — METRONIDAZOLE 500 MG PO TABS: 500.0000 mg | ORAL_TABLET | Freq: Two times a day (BID) | ORAL | Status: DC

## 2013-07-24 MED ORDER — FOLIC ACID 1 MG PO TABS: 1.0000 mg | ORAL_TABLET | Freq: Every day | ORAL | Status: DC

## 2013-07-24 NOTE — Discharge Summary (Signed)
Physician Discharge Summary  MARSHAE AZAM WUJ:[id number] DOB: 1955/10/04 DOA: 07/22/2013  PCP: Vertis Kelch, NP  Admit date: 07/22/2013 Discharge date: 07/24/2013  Recommendations for Outpatient Follow-up:  1. Assistance with alcohol cessation and treatment 2. Repeat TSH level and start treatment if needed   Discharge Diagnoses:     Acute gastroenteritis- resolved   Possible hypothyroidism with mildly elevated TSH   Alcohol abuse   Hyponatremia - resolved   Hypokalemia - resolved   Dehydration - treated    Macrocytosis   Cholelithiasis   Renal cyst   Cholelithiasis   Trichomonas infection  Discharge Condition: stable  Diet recommendation: low fat diet   Filed Weights   07/23/13 1748  Weight: 155 lb (70.308 kg)    History of present illness:  [name] is a 57 y.o. female who denies any health problems except for a TIA in the remote past, who is a very poor historian. She is accompanied by her daughter, who provides some of the history. Apparently, patient was doing well up until last Monday, when she started having nausea and vomiting. However, it sounds like it's been mainly nausea, with only a few episodes of emesis, which has been watery. Denies any blood in the emesis. She denies any abdominal pain. No dysuria, but has been having watery diarrhea on and off since last week. She has had some chills. Had low grade fever with temperature up to 100.7. She's had dry mouth. She has been unsteady in her gait according to the daughter. Denies any syncopal episode, but has been dizzy. Denies eating out anywhere. Denies eating any new foods or any salads recently. She had black bowel movement yesterday, but has been taking Pepto-Bismol, which could explain the black stool. Denies any sick contacts, but since she has been sick as her husband has similar symptoms. She also admits to drinking alcohol on a daily basis.  Hospital Course:  Acute gastroenteritis - improving with  IVFs, attempt to advance diet today  Dehydration - continue IVFs, much improved clinically  Alcohol abuse - withdrawal protocol, Thiamine, pt strongly advised to cut down on her alcohol consumption and seek help through her PCP  Cholelithiasis - Abd [place] suggested cholecystitis - pt has been asymptomatic and tolerating regular diet, pt was seen by general surgery and they did not feel she had acute cholecystitis and advised pt to monitor, low fat diet, return to ER if symptoms return. The patient verbalized understanding.    Hypokalemia - resolved after treatment, Mg WNL Elevated TSH - recommend pt have TSH retested with PCP and treated if needed, pt had been acutely ill and therefore did not start treatment in hospital Trichomonas seen in urine - flagyl 500 mg po BID prescribed, pt counseled.   Procedures:  Abd ultrasound  Consultations:  General surgery  Discharge Exam: Pt asking to go home, tolerating regular diet, no complaints of pain or discomfort, diarrhea resolved Filed Vitals:   07/24/13 0500  BP: 133/77  Pulse: 101  Temp: 99.3 F (37.4 C)  Resp: 18    General: awake, alert, no distress, cooperative Cardiovascular: normal s1, s2 sounds  Respiratory: BBS clear to auscultation Abd: soft, nondistended, nontender, no masses palpated, normal BS  Discharge Instructions  Discharge Orders   Future Orders Complete By Expires   Diet - low sodium heart healthy  As directed    Discharge instructions  As directed    Comments:     Return if symptoms recur, worsen or new problems  develop.  Cut down on alcohol consumption and seek help with stopping alcohol with your primary care physician Stop smoking and using all nicotine products Low fat diet recommended   Increase activity slowly  As directed        Medication List         albuterol 108 (90 BASE) MCG/ACT inhaler  Commonly known as:  PROVENTIL HFA;VENTOLIN HFA  Inhale 1-2 puffs into the lungs every 6 (six) hours as  needed for wheezing.     folic acid 1 MG tablet  Commonly known as:  FOLVITE  Take 1 tablet (1 mg total) by mouth daily.     multivitamin with minerals Tabs tablet  Take 1 tablet by mouth daily.     nicotine 14 mg/24hr patch  Commonly known as:  NICODERM CQ - dosed in mg/24 hours  Place 1 patch (14 mg total) onto the skin daily.     thiamine 100 MG tablet  Take 1 tablet (100 mg total) by mouth daily.         Flagyl 500 mg - take 1 po BID x 7 days  No Known Allergies  The results of significant diagnostics from this hospitalization (including imaging, microbiology, ancillary and laboratory) are listed below for reference.    Significant Diagnostic Studies: Dg Chest 1 View  07/22/2013   CLINICAL DATA:  Weakness.  Vomiting.  Diarrhea.  EXAM: CHEST - 1 VIEW  COMPARISON:  09/24/2011  FINDINGS: Very low lung volumes are noted, however both lungs are otherwise clear. No evidence of pneumothorax or pleural effusion. Heart size within normal limits allowing for low lung volumes.  IMPRESSION: Very low lung volumes.  No acute findings.   Electronically Signed   By: Myles Rosenthal M.D.   On: 07/22/2013 20:11   Ct Abdomen Pelvis W Contrast  07/22/2013   CLINICAL DATA:  Nausea and vomiting. Slurred speech. Emesis. Diarrhea.  EXAM: CT ABDOMEN AND PELVIS WITH CONTRAST  TECHNIQUE: Multidetector CT imaging of the abdomen and pelvis was performed using the standard protocol following bolus administration of intravenous contrast.  CONTRAST:  50mL OMNIPAQUE IOHEXOL 300 MG/ML SOLN, OMNIPAQUE IOHEXOL 300 MG/ML SOLN  COMPARISON:  05/21/2007  FINDINGS: Lingular subsegmental atelectasis or scarring.  Lymph node adjacent to the distal thoracic esophagus, short axis diameter 1.3 cm, image 12 of series 2.  Right retrocrural lymph node short axis 0.7 cm, image 21 of series 2 (formerly 0.5 cm). Very pancreatic peripancreatic lymph node 0.9 cm, image 28 of series 2.  Contracted and thick-walled gallbladder with  numerous internal calcifications favoring gallstones.  Pancreas, spleen, liver, and adrenal glands unremarkable.  Bosniak category 1, 3.1 by 2.7 cm cyst of the left kidney lower pole. 1.4 x 1.1 by 1.1 cm exophytic complex lesion of the left kidney lower pole as portal venous phase density of 68 Hounsfield units and delayed phase density of 67 Hounsfield units. No hydronephrosis or hydroureter. No calculi observed.  Fatty deposition in the wall of the ascending colon, similar to prior. Calcified uterine fibroid, reduced in size. Low position of the cervix and vagina favors pelvic floor laxity.  Appendix is absent.  Terminal ileum unremarkable.  IMPRESSION: 1. Complex exophytic lesion of the left kidney lower pole does not change in density between portal venous and delayed phase images, and accordingly is likely a Bosniak category 2 cyst (complex but benign). 2. Left renal scarring 3. Contracted and thick-walled gallbladder with numerous internal calcifications favoring multiple gallstones, significantly increased in number compared  to prior exam. 4. Lingular subsegmental atelectasis or scarring. 5. Mildly prominent lymph node near the hiatus, potentially reactive due to local inflammation, but technically nonspecific. 6. Fatty deposition in the ascending colon wall is a normal variant appearance although can sometimes be associated with inflammatory bowel disease. 7. Reduced size of calcified uterine fibroid. 8. Pelvic floor laxity, with low position of the cervix.   Electronically Signed   By: Herbie Baltimore M.D.   On: 07/22/2013 20:17   US Abdomen Limited Ruq  07/23/2013   CLINICAL DATA:  Right upper quadrant discomfort with nausea  EXAM: US ABDOMEN LIMITED - RIGHT UPPER QUADRANT  COMPARISON:  CT scan of the abdomen dated July 22, 2013  FINDINGS: Gallbladder  The gallbladder is contracted and contains echogenic sludge and mobile stones. There is mild gallbladder wall thickening to just over 3 mm and  there is a trace of pericholecystic fluid. . There is a positive sonographic Murphy's sign.  Common bile duct  Diameter: 5.1 mm.  Liver:  The echotexture of the liver is increased suggesting fatty infiltration. There is no intrahepatic ductal dilation. The liver measures 17 cm in the midclavicular line. There is a tiny echogenic focus measuring 6 mm in diameter in the right hepatic lobe. This was not clearly evident on the previous CT scan.  IMPRESSION: 1. Findings are consistent with acute cholecystitis and cholelithiasis. 2. There is no common bowel duct dilation or intrahepatic ductal dilation. 3. There fatty infiltrative changes of the liver.   Electronically Signed   By: David  [place]   On: 07/23/2013 13:56    Microbiology: No results found for this or any previous visit (from the past 240 hour(s)).   Labs: Basic Metabolic Panel:  Recent Labs Lab 07/22/13 1736 07/22/13 2201 07/23/13 0455 07/24/13 0502  NA 126*  --  135 129*  K 3.0*  --  3.8 3.6  CL 89*  --  97 98  CO2 24  --  25 22  GLUCOSE 120*  --  90 89  BUN 18  --  13 7  CREATININE 0.70  --  0.61 0.57  CALCIUM 9.1  --  8.6 7.8*  MG  --  1.6  --   --    Liver Function Tests:  Recent Labs Lab 07/22/13 1736 07/23/13 0455 07/24/13 0502  AST 69* 61* 52*  ALT 24 23 19   ALKPHOS 168* 163* 134*  BILITOT 1.9* 1.9* 1.6*  PROT 8.2 8.0 6.5  ALBUMIN 2.6* 2.5* 2.0*    Recent Labs Lab 07/22/13 1736 07/24/13 0502  LIPASE 16 15  AMYLASE  --  17   No results found for this basename: AMMONIA,  in the last 168 hours CBC:  Recent Labs Lab 07/22/13 1736 07/23/13 0455 07/24/13 0502  WBC 8.7 8.8 5.9  NEUTROABS 7.3  --   --   HGB 13.8 14.3 12.1  HCT 38.3 41.1 35.0*  MCV 108.2* 111.1* 110.1*  PLT 139* 149* 113*   Cardiac Enzymes:  Recent Labs Lab 07/22/13 1736  TROPONINI <0.30   BNP: BNP (last 3 results) No results found for this basename: PROBNP,  in the last 8760 hours CBG: No results found for this basename:  GLUCAP,  in the last 168 hours   Signed:  Clanford Johnson  [facility] 07/24/2013, 9:02 AM

## 2013-07-24 NOTE — Consult Note (Signed)
Reason for Consult: Cholelithiasis Referring Physician: [facility]  [name] is an 57 y.o. female.  HPI: Patient is a 57 year old white female with a strong history of alcohol abuse who presented to [facility] with worsening diarrhea and nausea. She was put on DT protocol. Workup did reveal some mild elevation of her liver enzyme test. Lipase was within normal limits. Ultrasound was performed which revealed cholelithiasis and sludge with a thickened gallbladder wall, but minimal pericholecystic fluid. Common bile duct was within normal limits. I was asked to see the patient concerning the ultrasound findings. The patient currently is tolerating a regular diet well. She denies any symptoms of biliary colic prior to her admission. She denies any right upper quadrant abdominal pain while she has been in the hospital. No fever, chills, jaundice have been noted.  Past Medical History  Diagnosis Date  . Hypertension   . GERD (gastroesophageal reflux disease)   . Stroke 2008    left sided weakness  . Alcohol abuse   . Normal cardiac stress test 2008    myoview  . Hepatic steatosis   . Cholelithiasis     Past Surgical History  Procedure Laterality Date  . Esophagus stretched    . Appendectomy      No family history on file.  Social History:  reports that she has been smoking.  She does not have any smokeless tobacco history on file. She reports that she drinks about 12.0 ounces of alcohol per week. She reports that she does not use illicit drugs.  Allergies: No Known Allergies  Medications: I have reviewed the patient's current medications.  Results for orders placed during the hospital encounter of 07/22/13 (from the past 48 hour(s))  OCCULT BLOOD, POC DEVICE     Status: None   Collection Time    07/22/13  5:09 PM      Result Value Range   Fecal Occult Bld NEGATIVE  NEGATIVE  CBC WITH DIFFERENTIAL     Status: Abnormal   Collection Time    07/22/13  5:36 PM       Result Value Range   WBC 8.7  4.0 - 10.5 K/uL   RBC 3.54 (*) 3.87 - 5.11 MIL/uL   Hemoglobin 13.8  12.0 - 15.0 g/dL   HCT 16.1  09.6 - 04.5 %   MCV 108.2 (*) 78.0 - 100.0 fL   MCH 39.0 (*) 26.0 - 34.0 pg   MCHC 36.0  30.0 - 36.0 g/dL   RDW 40.9  81.1 - 91.4 %   Platelets 139 (*) 150 - 400 K/uL   Neutrophils Relative % 84 (*) 43 - 77 %   Neutro Abs 7.3  1.7 - 7.7 K/uL   Lymphocytes Relative 8 (*) 12 - 46 %   Lymphs Abs 0.7  0.7 - 4.0 K/uL   Monocytes Relative 8  3 - 12 %   Monocytes Absolute 0.7  0.1 - 1.0 K/uL   Eosinophils Relative 0  0 - 5 %   Eosinophils Absolute 0.0  0.0 - 0.7 K/uL   Basophils Relative 0  0 - 1 %   Basophils Absolute 0.0  0.0 - 0.1 K/uL  COMPREHENSIVE METABOLIC PANEL     Status: Abnormal   Collection Time    07/22/13  5:36 PM      Result Value Range   Sodium 126 (*) 135 - 145 mEq/L   Potassium 3.0 (*) 3.5 - 5.1 mEq/L   Chloride 89 (*) 96 -  112 mEq/L   CO2 24  19 - 32 mEq/L   Glucose, Bld 120 (*) 70 - 99 mg/dL   BUN 18  6 - 23 mg/dL   Creatinine, Ser 1.61  0.50 - 1.10 mg/dL   Calcium 9.1  8.4 - 09.6 mg/dL   Total Protein 8.2  6.0 - 8.3 g/dL   Albumin 2.6 (*) 3.5 - 5.2 g/dL   AST 69 (*) 0 - 37 U/L   ALT 24  0 - 35 U/L   Alkaline Phosphatase 168 (*) 39 - 117 U/L   Total Bilirubin 1.9 (*) 0.3 - 1.2 mg/dL   GFR calc non Af Amer >90  >90 mL/min   GFR calc Af Amer >90  >90 mL/min   Comment: (NOTE)     The eGFR has been calculated using the CKD EPI equation.     This calculation has not been validated in all clinical situations.     eGFR's persistently <90 mL/min signify possible Chronic Kidney     Disease.  LIPASE, BLOOD     Status: None   Collection Time    07/22/13  5:36 PM      Result Value Range   Lipase 16  11 - 59 U/L  ETHANOL     Status: None   Collection Time    07/22/13  5:36 PM      Result Value Range   Alcohol, Ethyl (B) <11  0 - 11 mg/dL   Comment:            LOWEST DETECTABLE LIMIT FOR     SERUM ALCOHOL IS 11 mg/dL     FOR  MEDICAL PURPOSES ONLY  PROTIME-INR     Status: None   Collection Time    07/22/13  5:36 PM      Result Value Range   Prothrombin Time 14.8  11.6 - 15.2 seconds   INR 1.19  0.00 - 1.49  TROPONIN I     Status: None   Collection Time    07/22/13  5:36 PM      Result Value Range   Troponin I <0.30  <0.30 ng/mL   Comment:            Due to the release kinetics of cTnI,     a negative result within the first hours     of the onset of symptoms does not rule out     myocardial infarction with certainty.     If myocardial infarction is still suspected,     repeat the test at appropriate intervals.  MAGNESIUM     Status: None   Collection Time    07/22/13 10:01 PM      Result Value Range   Magnesium 1.6  1.5 - 2.5 mg/dL  TSH     Status: Abnormal   Collection Time    07/22/13 10:34 PM      Result Value Range   TSH 6.511 (*) 0.350 - 4.500 uIU/mL   Comment: Performed at [facility]  VITAMIN B12     Status: None   Collection Time    07/22/13 10:34 PM      Result Value Range   Vitamin B-12 614  211 - 911 pg/mL   Comment: Performed at [facility]  HEPATITIS PANEL, ACUTE     Status: None   Collection Time    07/23/13  4:55 AM      Result Value Range   Hepatitis B Surface Ag NEGATIVE  NEGATIVE   HCV Ab NEGATIVE  NEGATIVE   Hep A IgM NON REACTIVE  NON REACTIVE   Hep B C IgM NON REACTIVE  NON REACTIVE   Comment: (NOTE)     High levels of Hepatitis B Core IgM antibody are detectable     during the acute stage of Hepatitis B. This antibody is used     to differentiate current from past HBV infection.     Performed at [facility]  COMPREHENSIVE METABOLIC PANEL     Status: Abnormal   Collection Time    07/23/13  4:55 AM      Result Value Range   Sodium 135  135 - 145 mEq/L   Comment: DELTA CHECK NOTED   Potassium 3.8  3.5 - 5.1 mEq/L   Chloride 97  96 - 112 mEq/L   CO2 25  19 - 32 mEq/L   Glucose, Bld 90  70 - 99 mg/dL   BUN 13  6 - 23 mg/dL    Creatinine, Ser 1.61  0.50 - 1.10 mg/dL   Calcium 8.6  8.4 - 09.6 mg/dL   Total Protein 8.0  6.0 - 8.3 g/dL   Albumin 2.5 (*) 3.5 - 5.2 g/dL   AST 61 (*) 0 - 37 U/L   ALT 23  0 - 35 U/L   Alkaline Phosphatase 163 (*) 39 - 117 U/L   Total Bilirubin 1.9 (*) 0.3 - 1.2 mg/dL   GFR calc non Af Amer >90  >90 mL/min   GFR calc Af Amer >90  >90 mL/min   Comment: (NOTE)     The eGFR has been calculated using the CKD EPI equation.     This calculation has not been validated in all clinical situations.     eGFR's persistently <90 mL/min signify possible Chronic Kidney     Disease.  CBC     Status: Abnormal   Collection Time    07/23/13  4:55 AM      Result Value Range   WBC 8.8  4.0 - 10.5 K/uL   RBC 3.70 (*) 3.87 - 5.11 MIL/uL   Hemoglobin 14.3  12.0 - 15.0 g/dL   HCT 04.5  40.9 - 81.1 %   MCV 111.1 (*) 78.0 - 100.0 fL   MCH 38.6 (*) 26.0 - 34.0 pg   MCHC 34.8  30.0 - 36.0 g/dL   RDW 91.4  78.2 - 95.6 %   Platelets 149 (*) 150 - 400 K/uL  URINALYSIS W MICROSCOPIC + REFLEX CULTURE     Status: Abnormal   Collection Time    07/23/13  1:35 PM      Result Value Range   Color, Urine AMBER (*) YELLOW   Comment: BIOCHEMICALS MAY BE AFFECTED BY COLOR   APPearance CLEAR  CLEAR   Specific Gravity, Urine 1.015  1.005 - 1.030   pH 6.0  5.0 - 8.0   Glucose, UA NEGATIVE  NEGATIVE mg/dL   Hgb urine dipstick NEGATIVE  NEGATIVE   Bilirubin Urine NEGATIVE  NEGATIVE   Ketones, ur NEGATIVE  NEGATIVE mg/dL   Protein, ur NEGATIVE  NEGATIVE mg/dL   Urobilinogen, UA 4.0 (*) 0.0 - 1.0 mg/dL   Nitrite NEGATIVE  NEGATIVE   Leukocytes, UA TRACE (*) NEGATIVE   WBC, UA 3-6  <3 WBC/hpf   Bacteria, UA FEW (*) RARE   Squamous Epithelial / LPF FEW (*) RARE   Urine-Other TRICHOMONAS PRESENT    URINE RAPID DRUG SCREEN (HOSP PERFORMED)  Status: None   Collection Time    07/23/13  1:35 PM      Result Value Range   Opiates NONE DETECTED  NONE DETECTED   Cocaine NONE DETECTED  NONE DETECTED   Benzodiazepines  NONE DETECTED  NONE DETECTED   Amphetamines NONE DETECTED  NONE DETECTED   Tetrahydrocannabinol NONE DETECTED  NONE DETECTED   Barbiturates NONE DETECTED  NONE DETECTED   Comment:            DRUG SCREEN FOR MEDICAL PURPOSES     ONLY.  IF CONFIRMATION IS NEEDED     FOR ANY PURPOSE, NOTIFY LAB     WITHIN 5 DAYS.                LOWEST DETECTABLE LIMITS     FOR URINE DRUG SCREEN     Drug Class       Cutoff (ng/mL)     Amphetamine      1000     Barbiturate      200     Benzodiazepine   200     Tricyclics       300     Opiates          300     Cocaine          300     THC              50  CBC     Status: Abnormal   Collection Time    07/24/13  5:02 AM      Result Value Range   WBC 5.9  4.0 - 10.5 K/uL   RBC 3.18 (*) 3.87 - 5.11 MIL/uL   Hemoglobin 12.1  12.0 - 15.0 g/dL   HCT 16.1 (*) 09.6 - 04.5 %   MCV 110.1 (*) 78.0 - 100.0 fL   MCH 38.1 (*) 26.0 - 34.0 pg   MCHC 34.6  30.0 - 36.0 g/dL   RDW 40.9  81.1 - 91.4 %   Platelets 113 (*) 150 - 400 K/uL   Comment: SPECIMEN CHECKED FOR CLOTS     PLATELET COUNT CONFIRMED BY SMEAR  COMPREHENSIVE METABOLIC PANEL     Status: Abnormal   Collection Time    07/24/13  5:02 AM      Result Value Range   Sodium 129 (*) 135 - 145 mEq/L   Potassium 3.6  3.5 - 5.1 mEq/L   Chloride 98  96 - 112 mEq/L   CO2 22  19 - 32 mEq/L   Glucose, Bld 89  70 - 99 mg/dL   BUN 7  6 - 23 mg/dL   Creatinine, Ser 7.82  0.50 - 1.10 mg/dL   Calcium 7.8 (*) 8.4 - 10.5 mg/dL   Total Protein 6.5  6.0 - 8.3 g/dL   Albumin 2.0 (*) 3.5 - 5.2 g/dL   AST 52 (*) 0 - 37 U/L   ALT 19  0 - 35 U/L   Alkaline Phosphatase 134 (*) 39 - 117 U/L   Total Bilirubin 1.6 (*) 0.3 - 1.2 mg/dL   GFR calc non Af Amer >90  >90 mL/min   GFR calc Af Amer >90  >90 mL/min   Comment: (NOTE)     The eGFR has been calculated using the CKD EPI equation.     This calculation has not been validated in all clinical situations.     eGFR's persistently <90 mL/min signify possible Chronic Kidney  Disease.  LIPASE, BLOOD     Status: None   Collection Time    07/24/13  5:02 AM      Result Value Range   Lipase 15  11 - 59 U/L  AMYLASE     Status: None   Collection Time    07/24/13  5:02 AM      Result Value Range   Amylase 17  0 - 105 U/L  LIPID PANEL     Status: Abnormal   Collection Time    07/24/13  5:02 AM      Result Value Range   Cholesterol 104  0 - 200 mg/dL   Triglycerides 161  <096 mg/dL   HDL 4 (*) >04 mg/dL   Total CHOL/HDL Ratio 26.0     VLDL 29  0 - 40 mg/dL   LDL Cholesterol 71  0 - 99 mg/dL   Comment:            Total Cholesterol/HDL:CHD Risk     Coronary Heart Disease Risk Table                         Men   Women      1/2 Average Risk   3.4   3.3      Average Risk       5.0   4.4      2 X Average Risk   9.6   7.1      3 X Average Risk  23.4   11.0                Use the calculated Patient Ratio     above and the CHD Risk Table     to determine the patient's CHD Risk.                ATP III CLASSIFICATION (LDL):      <100     mg/dL   Optimal      540-981  mg/dL   Near or Above                        Optimal      130-159  mg/dL   Borderline      191-478  mg/dL   High      >295     mg/dL   Very High    Dg Chest 1 View  07/22/2013   CLINICAL DATA:  Weakness.  Vomiting.  Diarrhea.  EXAM: CHEST - 1 VIEW  COMPARISON:  09/24/2011  FINDINGS: Very low lung volumes are noted, however both lungs are otherwise clear. No evidence of pneumothorax or pleural effusion. Heart size within normal limits allowing for low lung volumes.  IMPRESSION: Very low lung volumes.  No acute findings.   Electronically Signed   By: Myles Rosenthal M.D.   On: 07/22/2013 20:11   Ct Abdomen Pelvis W Contrast  07/22/2013   CLINICAL DATA:  Nausea and vomiting. Slurred speech. Emesis. Diarrhea.  EXAM: CT ABDOMEN AND PELVIS WITH CONTRAST  TECHNIQUE: Multidetector CT imaging of the abdomen and pelvis was performed using the standard protocol following bolus administration of  intravenous contrast.  CONTRAST:  50mL OMNIPAQUE IOHEXOL 300 MG/ML SOLN, OMNIPAQUE IOHEXOL 300 MG/ML SOLN  COMPARISON:  05/21/2007  FINDINGS: Lingular subsegmental atelectasis or scarring.  Lymph node adjacent to the distal thoracic esophagus, short axis diameter 1.3 cm, image 12 of series 2.  Right retrocrural lymph  node short axis 0.7 cm, image 21 of series 2 (formerly 0.5 cm). Very pancreatic peripancreatic lymph node 0.9 cm, image 28 of series 2.  Contracted and thick-walled gallbladder with numerous internal calcifications favoring gallstones.  Pancreas, spleen, liver, and adrenal glands unremarkable.  Bosniak category 1, 3.1 by 2.7 cm cyst of the left kidney lower pole. 1.4 x 1.1 by 1.1 cm exophytic complex lesion of the left kidney lower pole as portal venous phase density of 68 Hounsfield units and delayed phase density of 67 Hounsfield units. No hydronephrosis or hydroureter. No calculi observed.  Fatty deposition in the wall of the ascending colon, similar to prior. Calcified uterine fibroid, reduced in size. Low position of the cervix and vagina favors pelvic floor laxity.  Appendix is absent.  Terminal ileum unremarkable.  IMPRESSION: 1. Complex exophytic lesion of the left kidney lower pole does not change in density between portal venous and delayed phase images, and accordingly is likely a Bosniak category 2 cyst (complex but benign). 2. Left renal scarring 3. Contracted and thick-walled gallbladder with numerous internal calcifications favoring multiple gallstones, significantly increased in number compared to prior exam. 4. Lingular subsegmental atelectasis or scarring. 5. Mildly prominent lymph node near the hiatus, potentially reactive due to local inflammation, but technically nonspecific. 6. Fatty deposition in the ascending colon wall is a normal variant appearance although can sometimes be associated with inflammatory bowel disease. 7. Reduced size of calcified uterine fibroid. 8. Pelvic  floor laxity, with low position of the cervix.   Electronically Signed   By: Herbie Baltimore M.D.   On: 07/22/2013 20:17   US Abdomen Limited Ruq  07/23/2013   CLINICAL DATA:  Right upper quadrant discomfort with nausea  EXAM: US ABDOMEN LIMITED - RIGHT UPPER QUADRANT  COMPARISON:  CT scan of the abdomen dated July 22, 2013  FINDINGS: Gallbladder  The gallbladder is contracted and contains echogenic sludge and mobile stones. There is mild gallbladder wall thickening to just over 3 mm and there is a trace of pericholecystic fluid. . There is a positive sonographic Murphy's sign.  Common bile duct  Diameter: 5.1 mm.  Liver:  The echotexture of the liver is increased suggesting fatty infiltration. There is no intrahepatic ductal dilation. The liver measures 17 cm in the midclavicular line. There is a tiny echogenic focus measuring 6 mm in diameter in the right hepatic lobe. This was not clearly evident on the previous CT scan.  IMPRESSION: 1. Findings are consistent with acute cholecystitis and cholelithiasis. 2. There is no common bowel duct dilation or intrahepatic ductal dilation. 3. There fatty infiltrative changes of the liver.   Electronically Signed   By: David  [place]   On: 07/23/2013 13:56    ROS: See chart Blood pressure 133/77, pulse 101, temperature 99.3 F (37.4 C), temperature source Oral, resp. rate 18, height 5\' 3"  (1.6 m), weight 70.308 kg (155 lb), SpO2 99.00%. Physical Exam: Pleasant white female in no acute distress. Abdomen is soft, nontender, nondistended. No hepatosplenomegaly, masses, or hernias are identified.  Assessment/Plan: Impression: Cholelithiasis, asymptomatic at this time. Patient has a strong history of alcohol abuse and [facility]. As she is asymptomatic, I do not recommend cholecystectomy at this time. She was told that she needs to stop drinking. Should she develop any signs of biliary colic, she was told to come back to the emergency  room.  Adaleigh Warf A 07/24/2013, 8:55 AM

## 2014-07-05 ENCOUNTER — Emergency Department (HOSPITAL_COMMUNITY)
Admission: EM | Admit: 2014-07-05 | Discharge: 2014-07-05 | Disposition: A | Discharge status: Home / Self Care | Payer: Self-pay | Attending: Emergency Medicine | Admitting: Emergency Medicine

## 2014-07-05 ENCOUNTER — Encounter (HOSPITAL_COMMUNITY): Payer: Self-pay | Admitting: Emergency Medicine

## 2014-07-05 ENCOUNTER — Emergency Department (HOSPITAL_COMMUNITY): Payer: Self-pay

## 2014-07-05 DIAGNOSIS — S4991XA Unspecified injury of right shoulder and upper arm, initial encounter: Secondary | ICD-10-CM | POA: Insufficient documentation

## 2014-07-05 DIAGNOSIS — W1839XA Other fall on same level, initial encounter: Secondary | ICD-10-CM | POA: Insufficient documentation

## 2014-07-05 DIAGNOSIS — Z8673 Personal history of transient ischemic attack (TIA), and cerebral infarction without residual deficits: Secondary | ICD-10-CM | POA: Insufficient documentation

## 2014-07-05 DIAGNOSIS — Z792 Long term (current) use of antibiotics: Secondary | ICD-10-CM | POA: Insufficient documentation

## 2014-07-05 DIAGNOSIS — Y939 Activity, unspecified: Secondary | ICD-10-CM | POA: Insufficient documentation

## 2014-07-05 DIAGNOSIS — Y9289 Other specified places as the place of occurrence of the external cause: Secondary | ICD-10-CM | POA: Insufficient documentation

## 2014-07-05 DIAGNOSIS — I10 Essential (primary) hypertension: Secondary | ICD-10-CM | POA: Insufficient documentation

## 2014-07-05 DIAGNOSIS — Z8719 Personal history of other diseases of the digestive system: Secondary | ICD-10-CM | POA: Insufficient documentation

## 2014-07-05 DIAGNOSIS — Z720 Tobacco use: Secondary | ICD-10-CM | POA: Insufficient documentation

## 2014-07-05 DIAGNOSIS — Z79899 Other long term (current) drug therapy: Secondary | ICD-10-CM | POA: Insufficient documentation

## 2014-07-05 MED ORDER — NAPROXEN 500 MG PO TABS: 500.0000 mg | ORAL_TABLET | Freq: Two times a day (BID) | ORAL | Status: DC

## 2014-07-05 MED ORDER — OXYCODONE-ACETAMINOPHEN 5-325 MG PO TABS
1.0000 | ORAL_TABLET | Freq: Once | ORAL | Status: AC
  Administered 2014-07-05: 1 via ORAL
  Filled 2014-07-05: qty 1

## 2014-07-05 MED ORDER — CYCLOBENZAPRINE HCL 5 MG PO TABS: 5.0000 mg | ORAL_TABLET | Freq: Three times a day (TID) | ORAL | Status: DC | PRN

## 2014-07-05 MED ORDER — HYDROCODONE-ACETAMINOPHEN 5-325 MG PO TABS: 1.0000 | ORAL_TABLET | ORAL | Status: DC | PRN
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
## 2014-07-05 NOTE — ED Notes (Signed)
Pt with left arm pain since August from a fall on hardwood floor

## 2014-07-05 NOTE — Discharge Instructions (Signed)
You will need to follow up with the orthopedic doctor for your shoulder pain. Wear the sling for comfort, apply ice and rest the area. Be sure to take your arm out of the sling several times a day and do range of motion.

## 2014-07-09 NOTE — ED Provider Notes (Signed)
Medical screening examination/treatment/procedure(s) were performed by non-physician practitioner and as supervising physician I was immediately available for consultation/collaboration.   EKG Interpretation None       Meeya Goldin, MD 07/09/14 0820 

## 2014-07-21 ENCOUNTER — Ambulatory Visit (INDEPENDENT_AMBULATORY_CARE_PROVIDER_SITE_OTHER): Payer: Self-pay | Admitting: Orthopedic Surgery

## 2014-07-21 ENCOUNTER — Encounter: Payer: Self-pay | Admitting: Orthopedic Surgery

## 2014-07-21 VITALS — BP 161/108 | Ht 63.0 in | Wt 143.0 lb

## 2014-07-21 DIAGNOSIS — M7541 Impingement syndrome of right shoulder: Secondary | ICD-10-CM

## 2014-07-21 DIAGNOSIS — M7540 Impingement syndrome of unspecified shoulder: Secondary | ICD-10-CM | POA: Insufficient documentation

## 2014-07-21 MED ORDER — NAPROXEN 500 MG PO TABS: 500.0000 mg | ORAL_TABLET | Freq: Two times a day (BID) | ORAL | Status: DC

## 2014-07-21 NOTE — Progress Notes (Addendum)
Patient ID: Elmon ElseDeborah C Borkenhagen, female   DOB: 1956/08/24, 58 y.o.   MRN: [id number]  Chief Complaint  Patient presents with  . Shoulder Pain    Right shoulder pain, DOI 04-22-14. Xrays at [facility].    58 years old female fell and injured her right shoulder and since that time she has noticed it's been more difficult for her to move and raise her arm and she has pain over the right shoulder and deltoid non-radiating below the elbow.  Review of systems  Epic has been reviewed regarding her past medical history including a family history social history surgical history allergies and medications   BP 161/108 mmHg  Ht 5\' 3"  (1.6 m)  Wt 143 lb (64.864 kg)  BMI 25.34 kg/m2 Her frame is small her BMI is normal she is somewhat unkept. She is oriented 3 mood and affect are flat and depressed. She has tenderness around the posterior acromion anterolateral deltoid. Painful range of motion in the right shoulder with decreased external rotation and abduction as well as [place] elevation actively and painful range of motion and 120 passively. The shoulder however was stable. She had weakness of her supraspinatus tendon but normal strength in the internal and external rotators. She had a positive impingement sign but a negative drop test. There were no skin lesions or warmth around the shoulder she had a good radial pulse and normal sensation in the right hand.  Independent interpretation of her x-rays is that she has subtle changes of chronic rotator cuff disease with sclerotic bone under the acromion and at the greater tuberosity  No fracture was seen in the glenohumeral joint was well preserved  Impression Encounter Diagnosis  Name Primary?  . Shoulder impingement syndrome, right Yes     Procedure note the subacromial injection shoulder right  Verbal consent was obtained to inject the  right  Shoulder  Timeout was completed to confirm the injection site is a subacromial space of the  right  shoulder   Medication used Depo-Medrol 40 mg and lidocaine 1% 3 cc  Anesthesia was provided by ethyl chloride  The injection was performed in the right posterior subacromial space. After pinning the skin with alcohol and anesthetized the skin with ethyl chloride the subacromial space was injected using a 20-gauge needle. There were no complications  Sterile dressing was applied.  Unfortunately she is  self-pay so we gave her home exercises to do with [place] exercises in addition to her injection and she should take NSAIDs as well.

## 2014-07-21 NOTE — Patient Instructions (Addendum)
Prescription at the pharmacy Exercises at home  Joint Injection Care After Refer to this sheet in the next few days. These instructions provide you with information on caring for yourself after you have had a joint injection. Your caregiver also may give you more specific instructions. Your treatment has been planned according to current medical practices, but problems sometimes occur. Call your caregiver if you have any problems or questions after your procedure. After any type of joint injection, it is not uncommon to experience:  Soreness, swelling, or bruising around the injection site.  Mild numbness, tingling, or weakness around the injection site caused by the numbing medicine used before or with the injection. It also is possible to experience the following effects associated with the specific agent after injection:  Iodine-based contrast agents:  Allergic reaction (itching, hives, widespread redness, and swelling beyond the injection site).  Corticosteroids (These effects are rare.):  Allergic reaction.  Increased blood sugar levels (If you have diabetes and you notice that your blood sugar levels have increased, notify your caregiver).  Increased blood pressure levels.  Mood swings.  Hyaluronic acid in the use of viscosupplementation.  Temporary heat or redness.  Temporary rash and itching.  Increased fluid accumulation in the injected joint. These effects all should resolve within a day after your procedure.  HOME CARE INSTRUCTIONS  Limit yourself to light activity the day of your procedure. Avoid lifting heavy objects, bending, stooping, or twisting.  Take prescription or over-the-counter pain medication as directed by your caregiver.  You may apply ice to your injection site to reduce pain and swelling the day of your procedure. Ice may be applied 03-04 times:  Put ice in a plastic bag.  Place a towel between your skin and the bag.  Leave the ice on for no  longer than 15-20 minutes each time. SEEK IMMEDIATE MEDICAL CARE IF:   Pain and swelling get worse rather than better or extend beyond the injection site.  Numbness does not go away.  Blood or fluid continues to leak from the injection site.  You have chest pain.  You have swelling of your face or tongue.  You have trouble breathing or you become dizzy.  You develop a fever, chills, or severe tenderness at the injection site that last longer than 1 day. MAKE SURE YOU:  Understand these instructions.  Watch your condition.  Get help right away if you are not doing well or if you get worse. Document Released: 05/11/2011 Document Revised: 11/20/2011 Document Reviewed: 05/11/2011 [facility] Patient Information 2015 [place], [place]. This information is not intended to replace advice given to you by your health care provider. Make sure you discuss any questions you have with your health care provider. Bursitis Bursitis is when the fluid-filled sac (bursa) that covers and protects a joint gets puffy and irritated. The elbow, shoulder, hip, and knee joints are most often affected. HOME CARE  Put ice on the area.  Put ice in a plastic bag.  Place a towel between your skin and the bag.  Leave the ice on for 15-20 minutes, 03-04 times a day.  Put the joint through a full range of motion 4 times a day. Rest the injured joint at other times. When you have less pain, begin slow movements and usual activities.  Only take medicine as told by your doctor.  Follow up with your doctor. Any delay in care could stop the bursitis from healing. This could cause long-term pain. GET HELP RIGHT AWAY IF:  You have more pain with treatment.  You have a temperature by mouth above 102 F (38.9 C), not controlled by medicine.  You have heat and irritation over the fluid-filled sac. MAKE SURE YOU:   Understand these instructions.  Will watch your condition.  Will get help right away if you are  not doing well or get worse. Document Released: 02/15/2010 Document Revised: 11/20/2011 Document Reviewed: 11/17/2013 [facility] Patient Information 2015 [place], [place]. This information is not intended to replace advice given to you by your health care provider. Make sure you discuss any questions you have with your health care provider.

## 2014-08-24 ENCOUNTER — Telehealth: Payer: Self-pay | Admitting: Orthopedic Surgery

## 2014-08-24 NOTE — Telephone Encounter (Signed)
Routing to Dr Harrison 

## 2014-08-24 NOTE — Telephone Encounter (Signed)
When she has insurance I m happy to see her and proceed with mri etc., but she is uninsured and i can not see do anything else  She can be seen at health dept

## 2014-08-24 NOTE — Telephone Encounter (Signed)
Patient is calling for Rt shoulder pain, she states shes been doing exercises, she states naproxen (NAPROSYN) 500 MG tablet is not helping at all, Please advise?

## 2014-08-24 NOTE — Telephone Encounter (Signed)
PATIENT AWARE

## 2014-10-08 ENCOUNTER — Emergency Department (HOSPITAL_COMMUNITY)
Admission: EM | Admit: 2014-10-08 | Discharge: 2014-10-09 | Discharge status: Against Medical Advice | Payer: Self-pay | Attending: Emergency Medicine | Admitting: Emergency Medicine

## 2014-10-08 ENCOUNTER — Encounter (HOSPITAL_COMMUNITY): Payer: Self-pay

## 2014-10-08 DIAGNOSIS — Z8673 Personal history of transient ischemic attack (TIA), and cerebral infarction without residual deficits: Secondary | ICD-10-CM | POA: Insufficient documentation

## 2014-10-08 DIAGNOSIS — Z79899 Other long term (current) drug therapy: Secondary | ICD-10-CM | POA: Insufficient documentation

## 2014-10-08 DIAGNOSIS — I10 Essential (primary) hypertension: Secondary | ICD-10-CM | POA: Insufficient documentation

## 2014-10-08 DIAGNOSIS — Z720 Tobacco use: Secondary | ICD-10-CM | POA: Insufficient documentation

## 2014-10-08 DIAGNOSIS — Z791 Long term (current) use of non-steroidal anti-inflammatories (NSAID): Secondary | ICD-10-CM | POA: Insufficient documentation

## 2014-10-08 DIAGNOSIS — E876 Hypokalemia: Secondary | ICD-10-CM | POA: Insufficient documentation

## 2014-10-08 DIAGNOSIS — K219 Gastro-esophageal reflux disease without esophagitis: Secondary | ICD-10-CM | POA: Insufficient documentation

## 2014-10-08 DIAGNOSIS — L959 Vasculitis limited to the skin, unspecified: Secondary | ICD-10-CM | POA: Insufficient documentation

## 2014-10-08 NOTE — ED Notes (Signed)
Patient complaining of right lower leg pain and swelling. Patient has redness to bilateral lower legs

## 2014-10-09 ENCOUNTER — Encounter (HOSPITAL_COMMUNITY): Payer: Self-pay

## 2014-10-09 LAB — COMPREHENSIVE METABOLIC PANEL
ALT: 17 U/L (ref 0–35)
AST: 64 U/L — AB (ref 0–37)
Albumin: 3.4 g/dL — ABNORMAL LOW (ref 3.5–5.2)
Alkaline Phosphatase: 117 U/L (ref 39–117)
Anion gap: 8 (ref 5–15)
BILIRUBIN TOTAL: 1.1 mg/dL (ref 0.3–1.2)
BUN: 7 mg/dL (ref 6–23)
CO2: 27 mmol/L (ref 19–32)
Calcium: 8.6 mg/dL (ref 8.4–10.5)
Chloride: 101 mmol/L (ref 96–112)
Creatinine, Ser: 0.64 mg/dL (ref 0.50–1.10)
GFR calc Af Amer: >90 mL/min (ref >90)
GFR calc non Af Amer: >90 mL/min (ref >90)
GLUCOSE: 100 mg/dL — AB (ref 70–99)
Potassium: 2.9 mmol/L — ABNORMAL LOW (ref 3.5–5.1)
Sodium: 136 mmol/L (ref 135–145)
TOTAL PROTEIN: 8.2 g/dL (ref 6.0–8.3)

## 2014-10-09 LAB — CBC WITH DIFFERENTIAL/PLATELET
Basophils Absolute: 0.1 10*3/uL (ref 0.0–0.1)
Basophils Relative: 1 % (ref 0–1)
EOS PCT: 3 % (ref 0–5)
Eosinophils Absolute: 0.2 10*3/uL (ref 0.0–0.7)
HCT: 37.5 % (ref 36.0–46.0)
Hemoglobin: 12.9 g/dL (ref 12.0–15.0)
LYMPHS PCT: 54 % — AB (ref 12–46)
Lymphs Abs: 3.1 10*3/uL (ref 0.7–4.0)
MCH: 42 pg — ABNORMAL HIGH (ref 26.0–34.0)
MCHC: 34.4 g/dL (ref 30.0–36.0)
MCV: 122.1 fL — ABNORMAL HIGH (ref 78.0–100.0)
Monocytes Absolute: 0.4 10*3/uL (ref 0.1–1.0)
Monocytes Relative: 6 % (ref 3–12)
NEUTROS ABS: 2.1 10*3/uL (ref 1.7–7.7)
Neutrophils Relative %: 36 % — ABNORMAL LOW (ref 43–77)
PLATELETS: 73 10*3/uL — AB (ref 150–400)
RBC: 3.07 MIL/uL — AB (ref 3.87–5.11)
RDW: 13.3 % (ref 11.5–15.5)
Smear Review: DECREASED
WBC: 5.8 10*3/uL (ref 4.0–10.5)

## 2014-10-09 LAB — D-DIMER, QUANTITATIVE (NOT AT ARMC): D DIMER QUANT: 0.91 ug{FEU}/mL — AB (ref 0.00–0.48)

## 2014-10-09 NOTE — ED Notes (Signed)
Pt not in room, gown on stretcher. Pt not seen leaving department. Possibly left thru ambulance bay doors.

## 2014-10-09 NOTE — ED Provider Notes (Signed)
CSN: [id number]     Arrival date & time 10/08/14  2318 History   First MD Initiated Contact with Patient 10/09/14 0005     Chief Complaint  Patient presents with  . Leg Pain     (Consider location/radiation/quality/duration/timing/severity/associated sxs/prior Treatment) HPI Comments: Pt is a 59 y/o female who presents to the ED with c/o pain and swelling of the lower legs. She reports  A 4 day history of increase redness, swelling and pain. Pain is worse with walking and standing. Nothing makes the pain better. She has pain of the lower legs in the past, but the redness is different per pt. No known injury. Pt has hx of liver disease and ETOH abuse.  Denies any new clothing.No drainage reported.  Patient is a 59 y.o. female presenting with leg pain. The history is provided by the patient.  Leg Pain Associated symptoms: no back pain and no neck pain     Past Medical History  Diagnosis Date  . Hypertension   . GERD (gastroesophageal reflux disease)   . Stroke 2008    left sided weakness  . Alcohol abuse   . Normal cardiac stress test 2008    myoview  . Hepatic steatosis   . Cholelithiasis    Past Surgical History  Procedure Laterality Date  . Esophagus stretched    . Appendectomy     History reviewed. No pertinent family history. History  Substance Use Topics  . Smoking status: Current Every Day Smoker -- 1.50 packs/day    Types: Cigarettes  . Smokeless tobacco: Not on file  . Alcohol Use: 12.0 oz/week    20 Shots of liquor per week     Comment: daily   OB History    No data available     Review of Systems  Constitutional: Negative for activity change.       All ROS Neg except as noted in HPI  HENT: Negative.   Eyes: Negative for photophobia and discharge.  Respiratory: Negative for cough, shortness of breath and wheezing.   Cardiovascular: Negative for chest pain and palpitations.  Gastrointestinal: Negative for abdominal pain and blood in stool.   Genitourinary: Negative for dysuria, frequency and hematuria.  Musculoskeletal: Positive for myalgias and arthralgias. Negative for back pain and neck pain.  Skin: Negative.   Neurological: Positive for weakness. Negative for dizziness, seizures and speech difficulty.  Psychiatric/Behavioral: Negative for hallucinations and confusion.      Allergies  Review of patient's allergies indicates no known allergies.  Home Medications   Prior to Admission medications   Medication Sig Start Date End Date Taking? Authorizing Provider  albuterol (PROVENTIL HFA;VENTOLIN HFA) 108 (90 BASE) MCG/ACT inhaler Inhale 1-2 puffs into the lungs every 6 (six) hours as needed for wheezing. 09/25/11 07/22/13  Ezequiel Essex, MD  cyclobenzaprine (FLEXERIL) 5 MG tablet Take 1 tablet (5 mg total) by mouth 3 (three) times daily as needed for muscle spasms. 07/05/14   Hope Bunnie Pion, NP  folic acid (FOLVITE) 1 MG tablet Take 1 tablet (1 mg total) by mouth daily. 07/24/13   Clanford Marisa Hua, MD  HYDROcodone-acetaminophen (NORCO/VICODIN) 5-325 MG per tablet Take 1 tablet by mouth every 4 (four) hours as needed. 07/05/14   Hope Bunnie Pion, NP  metroNIDAZOLE (FLAGYL) 500 MG tablet Take 1 tablet (500 mg total) by mouth 2 (two) times daily with a meal. DO NOT CONSUME ALCOHOL WHILE TAKING THIS MEDICATION. 07/24/13   Clanford Marisa Hua, MD  Multiple Vitamin (MULTIVITAMIN WITH MINERALS)  TABS tablet Take 1 tablet by mouth daily. 07/24/13   Clanford Cyndie MullL Johnson, MD  naproxen (NAPROSYN) 500 MG tablet Take 1 tablet (500 mg total) by mouth 2 (two) times daily. 07/05/14   Hope Orlene OchM Neese, NP  naproxen (NAPROSYN) 500 MG tablet Take 1 tablet (500 mg total) by mouth 2 (two) times daily with a meal. 07/21/14   Vickki HearingStanley E Harrison, MD  nicotine (NICODERM CQ - DOSED IN MG/24 HOURS) 14 mg/24hr patch Place 1 patch (14 mg total) onto the skin daily. 07/24/13   Clanford Cyndie MullL Johnson, MD  thiamine 100 MG tablet Take 1 tablet (100 mg total) by mouth daily.  07/24/13   Clanford L Laural BenesJohnson, MD   BP 110/73 mmHg  Pulse 95  Temp(Src) 97.9 F (36.6 C) (Oral)  Resp 20  Ht 5\' 4"  (1.626 m)  Wt 143 lb (64.864 kg)  BMI 24.53 kg/m2  SpO2 100% Physical Exam  Constitutional: She is oriented to person, place, and time. She appears well-developed and well-nourished.  Non-toxic appearance.  HENT:  Head: Normocephalic.  Right Ear: Tympanic membrane and external ear normal.  Left Ear: Tympanic membrane and external ear normal.  Strong smell of alcohol present.  Eyes: EOM and lids are normal. Pupils are equal, round, and reactive to light.  Neck: Normal range of motion. Neck supple. Carotid bruit is not present.  Cardiovascular: Normal rate, regular rhythm, normal heart sounds, intact distal pulses and normal pulses.   Pulmonary/Chest: Breath sounds normal. No respiratory distress.  Abdominal: Soft. Bowel sounds are normal. There is no tenderness. There is no guarding.  Musculoskeletal: Normal range of motion.  Increase redness of the dorsum of the right and left lower extremities. DP 2+. No hot joints. Red rash is warm but not hot. No red streaks.   Lymphadenopathy:       Head (right side): No submandibular adenopathy present.       Head (left side): No submandibular adenopathy present.    She has no cervical adenopathy.  Neurological: She is alert and oriented to person, place, and time. She has normal strength. No cranial nerve deficit or sensory deficit.  Skin: Skin is warm and dry.  Psychiatric: She has a normal mood and affect. Her speech is normal.  Nursing note and vitals reviewed.   ED Course  Procedures (including critical care time) Labs Review Labs Reviewed  CBC WITH DIFFERENTIAL/PLATELET - Abnormal; Notable for the following:    RBC 3.07 (*)    MCV 122.1 (*)    MCH 42.0 (*)    Platelets 73 (*)    Neutrophils Relative % 36 (*)    Lymphocytes Relative 54 (*)    All other components within normal limits  COMPREHENSIVE METABOLIC PANEL  - Abnormal; Notable for the following:    Potassium 2.9 (*)    Glucose, Bld 100 (*)    Albumin 3.4 (*)    AST 64 (*)    All other components within normal limits  D-DIMER, QUANTITATIVE - Abnormal; Notable for the following:    D-Dimer, Quant 0.91 (*)    All other components within normal limits    Imaging Review No results found.   EKG Interpretation None      MDM  Vital signs stable. No red streaks.  Question early infection vs vasculitis.  Cbc  Reveals low platelets at 73,000. WBC wnl. Comprehensive Met. Panel reveals low potassium of 2.9. AST low at 64. D-Dimer elevated at 0.91  Dr. Judd Lienelo attempted to see pt, but  she left the room. I went by to recheck, but no patient in room at [place].   Final diagnoses:  None    *I have reviewed nursing notes, vital signs, and all appropriate lab and imaging results for this patient.[address] PA-C 10/09/14 0129  Veryl Speak, MD 10/09/14 [phone]

## 2015-03-21 ENCOUNTER — Emergency Department (HOSPITAL_COMMUNITY): Payer: Medicaid Other

## 2015-03-21 ENCOUNTER — Observation Stay (HOSPITAL_COMMUNITY)
Admission: EM | Admit: 2015-03-21 | Discharge: 2015-03-22 | Disposition: A | Discharge status: Home / Self Care | Payer: Medicaid Other | Attending: Internal Medicine | Admitting: Internal Medicine

## 2015-03-21 ENCOUNTER — Encounter (HOSPITAL_COMMUNITY): Payer: Self-pay | Admitting: Emergency Medicine

## 2015-03-21 DIAGNOSIS — K760 Fatty (change of) liver, not elsewhere classified: Secondary | ICD-10-CM | POA: Diagnosis not present

## 2015-03-21 DIAGNOSIS — K219 Gastro-esophageal reflux disease without esophagitis: Secondary | ICD-10-CM | POA: Insufficient documentation

## 2015-03-21 DIAGNOSIS — R0602 Shortness of breath: Secondary | ICD-10-CM | POA: Diagnosis present

## 2015-03-21 DIAGNOSIS — R2240 Localized swelling, mass and lump, unspecified lower limb: Secondary | ICD-10-CM | POA: Diagnosis not present

## 2015-03-21 DIAGNOSIS — F419 Anxiety disorder, unspecified: Secondary | ICD-10-CM | POA: Diagnosis not present

## 2015-03-21 DIAGNOSIS — I10 Essential (primary) hypertension: Secondary | ICD-10-CM | POA: Insufficient documentation

## 2015-03-21 DIAGNOSIS — R609 Edema, unspecified: Secondary | ICD-10-CM

## 2015-03-21 DIAGNOSIS — Z720 Tobacco use: Secondary | ICD-10-CM | POA: Insufficient documentation

## 2015-03-21 DIAGNOSIS — R945 Abnormal results of liver function studies: Secondary | ICD-10-CM | POA: Insufficient documentation

## 2015-03-21 DIAGNOSIS — K8020 Calculus of gallbladder without cholecystitis without obstruction: Secondary | ICD-10-CM | POA: Insufficient documentation

## 2015-03-21 DIAGNOSIS — Z79899 Other long term (current) drug therapy: Secondary | ICD-10-CM | POA: Diagnosis not present

## 2015-03-21 DIAGNOSIS — E876 Hypokalemia: Secondary | ICD-10-CM | POA: Diagnosis not present

## 2015-03-21 DIAGNOSIS — Z8673 Personal history of transient ischemic attack (TIA), and cerebral infarction without residual deficits: Secondary | ICD-10-CM | POA: Insufficient documentation

## 2015-03-21 DIAGNOSIS — D649 Anemia, unspecified: Secondary | ICD-10-CM

## 2015-03-21 LAB — CBC
HEMATOCRIT: 34.2 % — AB (ref 36.0–46.0)
HEMOGLOBIN: 11.8 g/dL — AB (ref 12.0–15.0)
MCH: 43.1 pg — ABNORMAL HIGH (ref 26.0–34.0)
MCHC: 34.5 g/dL (ref 30.0–36.0)
MCV: 124.8 fL — ABNORMAL HIGH (ref 78.0–100.0)
Platelets: 127 10*3/uL — ABNORMAL LOW (ref 150–400)
RBC: 2.74 MIL/uL — ABNORMAL LOW (ref 3.87–5.11)
RDW: 14.6 % (ref 11.5–15.5)
WBC: 7.2 10*3/uL (ref 4.0–10.5)

## 2015-03-21 LAB — BASIC METABOLIC PANEL
Anion gap: 12 (ref 5–15)
BUN: 5 mg/dL — ABNORMAL LOW (ref 6–20)
CO2: 31 mmol/L (ref 22–32)
Calcium: 8.5 mg/dL — ABNORMAL LOW (ref 8.9–10.3)
Chloride: 94 mmol/L — ABNORMAL LOW (ref 101–111)
Creatinine, Ser: 0.54 mg/dL (ref 0.44–1.00)
Glucose, Bld: 97 mg/dL (ref 65–99)
POTASSIUM: 2.6 mmol/L — AB (ref 3.5–5.1)
Sodium: 137 mmol/L (ref 135–145)

## 2015-03-21 LAB — BRAIN NATRIURETIC PEPTIDE: B NATRIURETIC PEPTIDE 5: 53 pg/mL (ref 0.0–100.0)

## 2015-03-21 LAB — TROPONIN I: Troponin I: 0.03 ng/mL (ref <0.031)

## 2015-03-21 LAB — ETHANOL: Alcohol, Ethyl (B): 174 mg/dL — ABNORMAL HIGH (ref <5)

## 2015-03-21 MED ORDER — POTASSIUM CHLORIDE 10 MEQ/100ML IV SOLN
10.0000 meq | Freq: Once | INTRAVENOUS | Status: AC
  Administered 2015-03-22: 10 meq via INTRAVENOUS
  Filled 2015-03-21: qty 100

## 2015-03-21 MED ORDER — POTASSIUM CHLORIDE CRYS ER 20 MEQ PO TBCR
40.0000 meq | EXTENDED_RELEASE_TABLET | Freq: Once | ORAL | Status: AC
  Administered 2015-03-22: 40 meq via ORAL
  Filled 2015-03-21: qty 2

## 2015-03-21 NOTE — ED Notes (Signed)
Patient reports, "I've been sick for about a month. I can't get any oxygen in me. It's like I'm drained. I'm dehydrated." Patient complains of swelling to right foot, decreased appetite, fatigue.

## 2015-03-21 NOTE — ED Provider Notes (Signed)
CSN: [id number]     Arrival date & time 03/21/15  1959 History  This chart was scribed for Debbie Octave, MD by Placido Sou, ED scribe. This patient was seen in room APA09/APA09 and the patient's care was started at 10:17 PM.    Chief Complaint  Patient presents with  . Shortness of Breath  . Foot Pain   The history is provided by the patient. No language interpreter was used.    HPI Comments: [name] is a 59 y.o. female who presents to the Emergency Department complaining of constant, moderate, swelling and pain to her right foot with onset 3-4 weeks ago and recent worsening of her symptoms. She notes associated intermittent fever which she hasn't confirmed, fatigue, cough, SOB, sore throat, nausea and vomiting. She notes her SOB also began 3-4 weeks ago. She notes falling last night and striking her head but denies any LOC. Pt notes a history of bronchitis and asthma and further notes using an inhaler for these symptoms. Pt notes she still consumes ETOH, experiences withdrawal when she doesn't drink and had her last drink PTA. She denies a history of DM or heart issues but does note that a prior physician told her she might need a stent in the future. Pt denies any abd pain.  Past Medical History  Diagnosis Date  . Hypertension   . GERD (gastroesophageal reflux disease)   . Stroke 2008    left sided weakness  . Alcohol abuse   . Normal cardiac stress test 2008    myoview  . Hepatic steatosis   . Cholelithiasis    Past Surgical History  Procedure Laterality Date  . Esophagus stretched    . Appendectomy     Family History  Problem Relation Age of Onset  . Leukemia Mother    History  Substance Use Topics  . Smoking status: Current Every Day Smoker -- 1.50 packs/day for 40 years    Types: Cigarettes  . Smokeless tobacco: Not on file  . Alcohol Use: 12.0 oz/week    20 Shots of liquor per week     Comment: daily   OB History    No data available     Review of  Systems A complete 10 system review of systems was obtained and all systems are negative except as noted in the HPI and PMH.   Allergies  Loratadine  Home Medications   Prior to Admission medications   Medication Sig Start Date End Date Taking? Authorizing Provider  albuterol (PROVENTIL HFA;VENTOLIN HFA) 108 (90 BASE) MCG/ACT inhaler Inhale 1-2 puffs into the lungs every 6 (six) hours as needed for wheezing. 09/25/11 03/21/15 Yes Debbie Octave, MD   BP 104/88 mmHg  Pulse 98  Temp(Src) 98.8 F (37.1 C) (Oral)  Resp 16  Ht 5\' 4"  (1.626 m)  Wt 140 lb (63.504 kg)  BMI 24.02 kg/m2  SpO2 96% Physical Exam  Constitutional: She is oriented to person, place, and time. She appears well-developed and well-nourished. No distress.  Anxious;   HENT:  Head: Normocephalic and atraumatic.  Mouth/Throat: Oropharynx is clear and moist. No oropharyngeal exudate.  Eyes: Conjunctivae and EOM are normal. Pupils are equal, round, and reactive to light.  Neck: Normal range of motion. Neck supple.  No meningismus.  Cardiovascular: Normal rate, regular rhythm, normal heart sounds and intact distal pulses.   No murmur heard. Intact dp/pt pulses  Pulmonary/Chest: Effort normal and breath sounds normal. No respiratory distress.  Decreased breath sounds at bases;  Abdominal: Soft. There is no tenderness. There is no rebound and no guarding.  Musculoskeletal: Normal range of motion. She exhibits edema and tenderness.       Right ankle: She exhibits normal range of motion.  Erythema and edema to right dorsal foot; no calf tenderness; full ROM of right foot  Neurological: She is alert and oriented to person, place, and time. No cranial nerve deficit. She exhibits normal muscle tone. Coordination normal.  No ataxia on finger to nose bilaterally. No pronator drift. 5/5 strength throughout. CN 2-12 intact. Equal grip strength. Sensation intact. Gait is not tested  Skin: Skin is warm.  Psychiatric: She has a  normal mood and affect. Her behavior is normal.  Nursing note and vitals reviewed.   ED Course  Procedures  DIAGNOSTIC STUDIES: Oxygen Saturation is 100% on RA, normal by my interpretation.    COORDINATION OF CARE: 10:26 PM Discussed treatment plan with pt at bedside and pt agreed to plan.  Labs Review Labs Reviewed  CBC - Abnormal; Notable for the following:    RBC 2.74 (*)    Hemoglobin 11.8 (*)    HCT 34.2 (*)    MCV 124.8 (*)    MCH 43.1 (*)    Platelets 127 (*)    All other components within normal limits  BASIC METABOLIC PANEL - Abnormal; Notable for the following:    Potassium 2.6 (*)    Chloride 94 (*)    BUN 5 (*)    Calcium 8.5 (*)    All other components within normal limits  ETHANOL - Abnormal; Notable for the following:    Alcohol, Ethyl (B) 174 (*)    All other components within normal limits  URIC ACID - Abnormal; Notable for the following:    Uric Acid, Serum 6.8 (*)    All other components within normal limits  BRAIN NATRIURETIC PEPTIDE  TROPONIN I  MAGNESIUM  SEDIMENTATION RATE    Imaging Review Dg Chest 2 View  03/21/2015   CLINICAL DATA:  Shortness of breath. Chest pain. Nonproductive cough. Right foot swelling and weakness.  EXAM: CHEST  2 VIEW  COMPARISON:  07/22/2013  FINDINGS: The patient is rotated to the left on today's radiograph, reducing diagnostic sensitivity and specificity.  The lungs appear clear. Cardiac and mediastinal contours normal. No pleural effusion identified.  IMPRESSION: No significant abnormality identified.   Electronically Signed   By: Gaylyn Rong M.D.   On: 03/21/2015 21:12   Dg Foot Complete Right  03/21/2015   CLINICAL DATA:  Swelling to the right foot. Started about 3 weeks ago. No injury.  EXAM: RIGHT FOOT COMPLETE - 3+ VIEW  COMPARISON:  None.  FINDINGS: Diffuse bone demineralization. Soft tissue density in the right first toe medial to the distal proximal phalanx. This is nonspecific in could represent  heterotopic ossification or foreign body. No evidence of acute fracture or dislocation. Diffuse soft tissue swelling over the dorsum of the right foot. No soft tissue gas collections.  IMPRESSION: Possible radiopaque foreign body versus heterotopic ossification in the right first toe. Dorsal soft tissue swelling. No acute bony abnormalities.   Electronically Signed   By: Burman Nieves M.D.   On: 03/21/2015 23:40     EKG Interpretation   Date/Time:  Sunday March 21 2015 20:44:21 EDT Ventricular Rate:  117 PR Interval:  166 QRS Duration: 84 QT Interval:  344 QTC Calculation: 479 R Axis:   2 Text Interpretation:  Sinus tachycardia Otherwise normal ECG Rate faster  Confirmed by Manus GunningANCOUR  MD, Jeannett SeniorSTEPHEN [phone] on 03/21/2015 10:29:29 PM      MDM   Final diagnoses:  Swelling  Hypokalemia   R foot pain and swelling x 3 weeks, no trauma.  Also intermittent SOB with cough. No Chest pain. Tachycardic. Lungs clear.  Edema to R foot, exuisite tenderness without erythema.  Intact pulses. Xray with STS. No FB seen at questioned location.  Tachycardic has improved. CXR clear.  Hypokalemia of 2.6.  Will replete.  Check mag. Admit for correction of hypokalemia.  Will obtaint CT foot to evaluate further. D/w Dr. Selena BattenKim.    Debbie OctaveStephen Zaley Talley, MD 03/22/15 [phone]

## 2015-03-21 NOTE — ED Notes (Signed)
CRITICAL VALUE ALERT  Critical value received:  K+ 2.6  Date of notification:  03/21/15  Time of notification:  2330  Critical value read back:Yes.    Nurse who received alert:  Georg RuddleJustin RN  MD notified (1st page):  Lynelle DoctorKnapp  Time of first page:  2330  MD notified (2nd page):  Time of second page:  Responding MD:  Lynelle DoctorKnapp  Time MD responded:  2330

## 2015-03-22 ENCOUNTER — Emergency Department (HOSPITAL_COMMUNITY): Payer: Medicaid Other

## 2015-03-22 ENCOUNTER — Observation Stay (HOSPITAL_COMMUNITY): Payer: Medicaid Other

## 2015-03-22 ENCOUNTER — Encounter (HOSPITAL_COMMUNITY): Payer: Self-pay | Admitting: Internal Medicine

## 2015-03-22 DIAGNOSIS — K7689 Other specified diseases of liver: Secondary | ICD-10-CM

## 2015-03-22 DIAGNOSIS — E876 Hypokalemia: Secondary | ICD-10-CM

## 2015-03-22 DIAGNOSIS — D649 Anemia, unspecified: Secondary | ICD-10-CM

## 2015-03-22 DIAGNOSIS — R945 Abnormal results of liver function studies: Secondary | ICD-10-CM | POA: Insufficient documentation

## 2015-03-22 LAB — SEDIMENTATION RATE: Sed Rate: 120 mm/hr — ABNORMAL HIGH (ref 0–22)

## 2015-03-22 LAB — COMPREHENSIVE METABOLIC PANEL
ALK PHOS: 125 U/L (ref 38–126)
ALT: 21 U/L (ref 14–54)
ANION GAP: 9 (ref 5–15)
AST: 127 U/L — ABNORMAL HIGH (ref 15–41)
Albumin: 2.5 g/dL — ABNORMAL LOW (ref 3.5–5.0)
BILIRUBIN TOTAL: 1.9 mg/dL — AB (ref 0.3–1.2)
CALCIUM: 8 mg/dL — AB (ref 8.9–10.3)
CO2: 29 mmol/L (ref 22–32)
Chloride: 99 mmol/L — ABNORMAL LOW (ref 101–111)
Creatinine, Ser: 0.49 mg/dL (ref 0.44–1.00)
GFR calc non Af Amer: >60 mL/min (ref >60)
Glucose, Bld: 93 mg/dL (ref 65–99)
POTASSIUM: 3.8 mmol/L (ref 3.5–5.1)
Sodium: 137 mmol/L (ref 135–145)
Total Protein: 7.2 g/dL (ref 6.5–8.1)

## 2015-03-22 LAB — CBC WITH DIFFERENTIAL/PLATELET
BASOS PCT: 1 % (ref 0–1)
Basophils Absolute: 0 10*3/uL (ref 0.0–0.1)
EOS PCT: 2 % (ref 0–5)
Eosinophils Absolute: 0.1 10*3/uL (ref 0.0–0.7)
HCT: 30.8 % — ABNORMAL LOW (ref 36.0–46.0)
HEMOGLOBIN: 10.6 g/dL — AB (ref 12.0–15.0)
LYMPHS PCT: 40 % (ref 12–46)
Lymphs Abs: 1.7 10*3/uL (ref 0.7–4.0)
MCH: 43.1 pg — ABNORMAL HIGH (ref 26.0–34.0)
MCHC: 34.4 g/dL (ref 30.0–36.0)
MCV: 125.2 fL — AB (ref 78.0–100.0)
MONO ABS: 0.3 10*3/uL (ref 0.1–1.0)
Monocytes Relative: 8 % (ref 3–12)
Neutro Abs: 2.1 10*3/uL (ref 1.7–7.7)
Neutrophils Relative %: 50 % (ref 43–77)
Platelets: 82 10*3/uL — ABNORMAL LOW (ref 150–400)
RBC: 2.46 MIL/uL — AB (ref 3.87–5.11)
RDW: 14.5 % (ref 11.5–15.5)
WBC: 4.3 10*3/uL (ref 4.0–10.5)

## 2015-03-22 LAB — TROPONIN I
Troponin I: 0.03 ng/mL (ref <0.031)
Troponin I: <0.03 ng/mL (ref <0.031)
Troponin I: <0.03 ng/mL (ref <0.031)

## 2015-03-22 LAB — MAGNESIUM
MAGNESIUM: 1.7 mg/dL (ref 1.7–2.4)
MAGNESIUM: 1.4 mg/dL — AB (ref 1.7–2.4)

## 2015-03-22 LAB — URIC ACID: URIC ACID, SERUM: 6.8 mg/dL — AB (ref 2.3–6.6)

## 2015-03-22 MED ORDER — POTASSIUM CHLORIDE CRYS ER 20 MEQ PO TBCR
40.0000 meq | EXTENDED_RELEASE_TABLET | Freq: Two times a day (BID) | ORAL | Status: AC
  Administered 2015-03-22 (×2): 40 meq via ORAL
  Filled 2015-03-22 (×2): qty 2

## 2015-03-22 MED ORDER — POTASSIUM CHLORIDE 10 MEQ/100ML IV SOLN: 10.0000 meq | INTRAVENOUS | Status: AC

## 2015-03-22 MED ORDER — COLCHICINE 0.6 MG PO TABS
0.6000 mg | ORAL_TABLET | Freq: Two times a day (BID) | ORAL | Status: DC
  Administered 2015-03-22: 0.6 mg via ORAL
  Filled 2015-03-22: qty 1

## 2015-03-22 MED ORDER — SODIUM CHLORIDE 0.9 % IJ SOLN: 3.0000 mL | INTRAMUSCULAR | Status: DC | PRN

## 2015-03-22 MED ORDER — SODIUM CHLORIDE 0.9 % IV SOLN
INTRAVENOUS | Status: AC
  Administered 2015-03-22: via INTRAVENOUS

## 2015-03-22 MED ORDER — SODIUM CHLORIDE 0.9 % IJ SOLN: 3.0000 mL | Freq: Two times a day (BID) | INTRAMUSCULAR | Status: DC

## 2015-03-22 MED ORDER — POTASSIUM CHLORIDE 10 MEQ/50ML IV SOLN
10.0000 meq | INTRAVENOUS | Status: DC
  Filled 2015-03-22 (×2): qty 50

## 2015-03-22 MED ORDER — MAGNESIUM SULFATE 2 GM/50ML IV SOLN
2.0000 g | Freq: Once | INTRAVENOUS | Status: AC
  Administered 2015-03-22: 2 g via INTRAVENOUS
  Filled 2015-03-22: qty 50

## 2015-03-22 MED ORDER — ALBUTEROL SULFATE (2.5 MG/3ML) 0.083% IN NEBU: 3.0000 mL | INHALATION_SOLUTION | Freq: Four times a day (QID) | RESPIRATORY_TRACT | Status: DC | PRN

## 2015-03-22 MED ORDER — SODIUM CHLORIDE 0.9 % IV SOLN: 250.0000 mL | INTRAVENOUS | Status: DC | PRN

## 2015-03-22 MED ORDER — MAGNESIUM SULFATE IN D5W 10-5 MG/ML-% IV SOLN
1.0000 g | Freq: Once | INTRAVENOUS | Status: DC
  Filled 2015-03-22: qty 100

## 2015-03-22 MED ORDER — POTASSIUM CHLORIDE 10 MEQ/100ML IV SOLN
10.0000 meq | INTRAVENOUS | Status: AC
  Administered 2015-03-22 (×2): 10 meq via INTRAVENOUS
  Filled 2015-03-22: qty 100

## 2015-03-22 MED ORDER — NICOTINE 21 MG/24HR TD PT24
21.0000 mg | MEDICATED_PATCH | Freq: Every day | TRANSDERMAL | Status: DC
  Administered 2015-03-22: 21 mg via TRANSDERMAL
  Filled 2015-03-22: qty 1

## 2015-03-22 NOTE — Progress Notes (Signed)
Upon arriving in room patient looking upset,her son was sitting across from her, I ask what is wrong,and she states" I need to go home, I have some things that are going on", my response was that the doctor would be the one to speak with her about discharge,and patient replies" bring what ever paper I need to sign now,"  Informed patient that she did not have a discharge,and that would be leaving against medical advice,states" I must go". Dr Randol KernElgergawy notified..Marland Kitchen

## 2015-03-22 NOTE — Care Management Note (Signed)
Case Management Note  Patient Details  Name: Elmon ElseDeborah C Kite MRN: [id number] Date of Birth: 11-08-1955  Subjective/Objective:                  Pt admitted from home with hypokalemia. Pt lives with family and will return home at discharge. Pt is independent with ADL's.  Action/Plan: Pt is adamant that she is leaving AMA. Pts nurse made aware. No further Cm needs noted.  Expected Discharge Date:  03/24/15               Expected Discharge Plan:  Against Medical Advice  In-House Referral:  NA  Discharge planning Services  CM Consult  Post Acute Care Choice:  NA Choice offered to:  NA  DME Arranged:    DME Agency:     HH Arranged:    HH Agency:     Status of Service:  Completed, signed off  Medicare Important Message Given:    Date Medicare IM Given:    Medicare IM give by:    Date Additional Medicare IM Given:    Additional Medicare Important Message give by:     If discussed at Long Length of Stay Meetings, dates discussed:    Additional Comments:  Cheryl FlashBlackwell, Modest Draeger Crowder, RN 03/22/2015, 2:42 PM

## 2015-03-22 NOTE — Discharge Summary (Signed)
Brief discharge summary; - As review progress note and H&P dictated earlier today for more details. - patient  reports she wants to leave AMA, discussed at length with the patient, tried to convince her to stay at least until tomorrow morning, wait for further testing, she is adamant about leaving now, she understands the risk of leaving AMA, including DVT, and even death, she still adamant about leaving, and is awake alert oriented 3, coherent. Huey Bienenstockawood Seldon Barrell MD

## 2015-03-22 NOTE — H&P (Signed)
[name] is an 59 y.o. female.    [facility]cp:  [facility] [place]  Chief Complaint: feeling weak HPI: 58 yo female with htn, CVA, c/o right foot swelling along with generalized weakness. Pt presented to ED for evaluation, and was noted to have right pedal edema. And hypokalemia with potassium of 2.6  Pt will be admitted for hypokalemia. Pt was noted to have possible foreign body in foot and CT scan is pending.   Past Medical History  Diagnosis Date  . Hypertension   . GERD (gastroesophageal reflux disease)   . Stroke 2008    left sided weakness  . Alcohol abuse   . Normal cardiac stress test 2008    myoview  . Hepatic steatosis   . Cholelithiasis     Past Surgical History  Procedure Laterality Date  . Esophagus stretched    . Appendectomy      Family History  Problem Relation Age of Onset  . Leukemia Mother    Social History:  reports that she has been smoking Cigarettes.  She has a 60 pack-year smoking history. She does not have any smokeless tobacco history on file. She reports that she drinks about 12.0 oz of alcohol per week. She reports that she does not use illicit drugs.  Allergies:  Allergies  Allergen Reactions  . Loratadine Rash   Medications reviewed   Results for orders placed or performed during the hospital encounter of 03/21/15 (from the past 48 hour(s))  CBC     Status: Abnormal   Collection Time: 03/21/15 10:09 PM  Result Value Ref Range   WBC 7.2 4.0 - 10.5 K/uL   RBC 2.74 (L) 3.87 - 5.11 MIL/uL   Hemoglobin 11.8 (L) 12.0 - 15.0 g/dL   HCT 81.7 (L) 99.7 - 97.0 %   MCV 124.8 (H) 78.0 - 100.0 fL   MCH 43.1 (H) 26.0 - 34.0 pg   MCHC 34.5 30.0 - 36.0 g/dL   RDW 62.0 67.9 - 40.1 %   Platelets 127 (L) 150 - 400 K/uL  BNP (order ONLY if patient complains of dyspnea/SOB AND you have documented it for THIS visit)     Status: None   Collection Time: 03/21/15 10:09 PM  Result Value Ref Range   B Natriuretic Peptide 53.0 0.0 - 100.0 pg/mL  Basic  metabolic panel     Status: Abnormal   Collection Time: 03/21/15 10:09 PM  Result Value Ref Range   Sodium 137 135 - 145 mmol/L   Potassium 2.6 (LL) 3.5 - 5.1 mmol/L    Comment: REPEATED TO VERIFY CRITICAL RESULT CALLED TO, READ BACK BY AND VERIFIED WITH: MIZE,J AT 2331 ON 03/21/15 BY MOSLEY,J    Chloride 94 (L) 101 - 111 mmol/L   CO2 31 22 - 32 mmol/L   Glucose, Bld 97 65 - 99 mg/dL   BUN 5 (L) 6 - 20 mg/dL   Creatinine, Ser 8.99 0.44 - 1.00 mg/dL   Calcium 8.5 (L) 8.9 - 10.3 mg/dL   GFR calc non Af Amer >60 >60 mL/min   GFR calc Af Amer >60 >60 mL/min    Comment: (NOTE) The eGFR has been calculated using the CKD EPI equation. This calculation has not been validated in all clinical situations. eGFR's persistently <60 mL/min signify possible Chronic Kidney Disease.    Anion gap 12 5 - 15  Troponin I     Status: None   Collection Time: 03/21/15 10:09 PM  Result Value Ref Range  Troponin I 0.03 <0.031 ng/mL    Comment:        NO INDICATION OF MYOCARDIAL INJURY.   Ethanol     Status: Abnormal   Collection Time: 03/21/15 10:27 PM  Result Value Ref Range   Alcohol, Ethyl (B) 174 (H) <5 mg/dL    Comment:        LOWEST DETECTABLE LIMIT FOR SERUM ALCOHOL IS 5 mg/dL FOR MEDICAL PURPOSES ONLY    Dg Chest 2 View  03/21/2015   CLINICAL DATA:  Shortness of breath. Chest pain. Nonproductive cough. Right foot swelling and weakness.  EXAM: CHEST  2 VIEW  COMPARISON:  07/22/2013  FINDINGS: The patient is rotated to the left on today's radiograph, reducing diagnostic sensitivity and specificity.  The lungs appear clear. Cardiac and mediastinal contours normal. No pleural effusion identified.  IMPRESSION: No significant abnormality identified.   Electronically Signed   By: Van Clines M.D.   On: 03/21/2015 21:12   Dg Foot Complete Right  03/21/2015   CLINICAL DATA:  Swelling to the right foot. Started about 3 weeks ago. No injury.  EXAM: RIGHT FOOT COMPLETE - 3+ VIEW  COMPARISON:   None.  FINDINGS: Diffuse bone demineralization. Soft tissue density in the right first toe medial to the distal proximal phalanx. This is nonspecific in could represent heterotopic ossification or foreign body. No evidence of acute fracture or dislocation. Diffuse soft tissue swelling over the dorsum of the right foot. No soft tissue gas collections.  IMPRESSION: Possible radiopaque foreign body versus heterotopic ossification in the right first toe. Dorsal soft tissue swelling. No acute bony abnormalities.   Electronically Signed   By: Lucienne Capers M.D.   On: 03/21/2015 23:40    Review of Systems  Constitutional: Negative.   HENT: Negative.   Eyes: Negative.   Respiratory: Negative.   Cardiovascular: Negative.   Gastrointestinal: Negative.   Genitourinary: Negative.   Musculoskeletal: Negative.   Skin: Negative.   Neurological: Negative.   Endo/Heme/Allergies: Negative.   Psychiatric/Behavioral: Negative.     Blood pressure 104/88, pulse 98, temperature 98.8 F (37.1 C), temperature source Oral, resp. rate 16, height $RemoveBe'5\' 4"'YmkTnTpXT$  (1.626 m), weight 63.504 kg (140 lb), SpO2 96 %. Physical Exam  Constitutional: She is oriented to person, place, and time. She appears well-developed and well-nourished.  HENT:  Head: Normocephalic and atraumatic.  Eyes: Conjunctivae and EOM are normal. Pupils are equal, round, and reactive to light. No scleral icterus.  Neck: Normal range of motion. Neck supple. No JVD present. No tracheal deviation present. No thyromegaly present.  Cardiovascular: Normal rate and regular rhythm.  Exam reveals no gallop and no friction rub.   No murmur heard. Respiratory: Effort normal and breath sounds normal. No respiratory distress. She has no wheezes. She has no rales.  GI: Soft. Bowel sounds are normal. She exhibits no distension. There is no tenderness. There is no rebound and no guarding.  Musculoskeletal: Normal range of motion. She exhibits no edema or tenderness.   Lymphadenopathy:    She has no cervical adenopathy.  Neurological: She is alert and oriented to person, place, and time. She has normal reflexes. She displays normal reflexes. No cranial nerve deficit. She exhibits normal muscle tone. Coordination normal.  Skin: Skin is warm and dry. No rash noted. No erythema. No pallor.  Psychiatric: She has a normal mood and affect. Her behavior is normal. Judgment and thought content normal.     Assessment/Plan Right foot pain Trial of colchicine 0.$RemoveBeforeD'6mg'QOKHNezwyxVCyQ$  po bid  x 1 day then qday CT scan pending  Hypokalemia Replete potassium,  Check magnesium Replete if needed Check cmp in am  Anemia Check cbc   Tachycardia Trop i q6h x3 Check tsh Check cardiac echo  ETOH dep ciwa protocol  DVT prophylaxis:  Cy Blamer 03/22/2015, 12:20 AM

## 2015-03-22 NOTE — Progress Notes (Signed)
Patient Demographics  [name], is a 59 y.o. female, DOB - 03/08/1956, ZOX:[id number]  Admit date - 03/21/2015   Admitting Physician Pearson Grippe, MD  Outpatient Primary MD for the patient is Vertis Kelch, NP  LOS -    Chief Complaint  Patient presents with  . Shortness of Breath  . Foot Pain       Admission HPI/Brief narrative: 59 yo female with htn, CVA, c/o right foot swelling and  weakness. Workup significant for hypokalemia . Subjective:   [name] today has, No headache, No chest pain, No abdominal pain - No Nausea, No Cough - SOB. reports she is already feeling better .   Assessment & Plan    Active Problems:   Hypokalemia   Anemia   Abnormal liver function   right foot pain  - Has a right lower extremity edema ,  will check venous Doppler . - CT foot did not show any foreign objects , only significant for small area of calcification medial to proximal phalanx , presents heterotopic calcification versus small osseous fragment . - PT consult   elevated AST and total bili  - Was likely related to liver disease from alcohol consumption   Thrombocytopenia - Likely related to liver disease from call consumption.  Left kidney lower pole lesion on imaging -  Will need CT scan pre-and post contrast for further evaluation , and be done as an outpatient .  Hypokalemia/hypomagnesemia - Repleted, recheck in a.m.Marland Kitchen   alcohol abuse - continue with CIWA protocol  Macrocytic anemia - due to alcohol consumption.  Code Status: full  Family Communication: none at [place]  Disposition Plan: home when stable   Procedures  none   Consults   none   Medications  Scheduled Meds: . sodium chloride   Intravenous STAT  . colchicine  0.6 mg Oral BID  . magnesium sulfate 1 - 4 g bolus IVPB  2 g Intravenous Once  . nicotine  21 mg Transdermal Daily  . potassium chloride  40  mEq Oral BID  . sodium chloride  3 mL Intravenous Q12H  . sodium chloride  3 mL Intravenous Q12H   Continuous Infusions:  PRN Meds:.sodium chloride, albuterol, sodium chloride  DVT Prophylaxis   SCDs   Lab Results  Component Value Date   PLT 82* 03/22/2015    Antibiotics    Anti-infectives    None          Objective:   Filed Vitals:   03/21/15 2035 03/21/15 2241 03/22/15 0300  BP: 102/80 104/88 139/81  Pulse: 124 98 92  Temp: 98.8 F (37.1 C)    TempSrc: Oral    Resp:  16   Height:  (1.626 m)    Weight: 63.504 kg (140 lb)    SpO2: 100% 96%     Wt Readings from Last 3 Encounters:  03/21/15 63.504 kg (140 lb)  10/08/14 64.864 kg (143 lb)  07/21/14 64.864 kg (143 lb)     Intake/Output Summary (Last 24 hours) at 03/22/15 1015 Last data filed at 03/22/15 0800  Gross per 24 hour  Intake    415 ml  Output      0 ml  Net    415 ml  Physical Exam  Awake Alert, Oriented X 3, No new F.N deficits, Normal affect .AT,PERRAL Supple Neck,No JVD, No cervical lymphadenopathy appriciated.  Symmetrical Chest wall movement, Good air movement bilaterally, CTAB RRR,No Gallops,Rubs or new Murmurs, No Parasternal Heave +ve B.Sounds, Abd Soft, No tenderness, No organomegaly appriciated, No rebound - guarding or rigidity. No Cyanosis, Clubbing , +2 left lower ext edema   Data Review   Micro Results No results found for this or any previous visit (from the past 240 hour(s)).  Radiology Reports Dg Chest 2 View  03/21/2015   CLINICAL DATA:  Shortness of breath. Chest pain. Nonproductive cough. Right foot swelling and weakness.  EXAM: CHEST  2 VIEW  COMPARISON:  07/22/2013  FINDINGS: The patient is rotated to the left on today's radiograph, reducing diagnostic sensitivity and specificity.  The lungs appear clear. Cardiac and mediastinal contours normal. No pleural effusion identified.  IMPRESSION: No significant abnormality identified.   Electronically Signed   By:  Gaylyn Rong M.D.   On: 03/21/2015 21:12   US Abdomen Complete  03/22/2015   CLINICAL DATA:  Abnormal liver function tests.  EXAM: ULTRASOUND ABDOMEN COMPLETE  COMPARISON:  CT abdomen and pelvis July 22, 2013; ultrasound right upper quadrant July 23, 2013  FINDINGS: Gallbladder: Within the gallbladder, there are multiple echogenic foci which move and shadow weakly consistent with gallstones. Largest individual gallstone measures approximately 4 mm in length. Sludge is also noted in the gallbladder. There is no gallbladder wall thickening or pericholecystic fluid. No sonographic Murphy sign noted.  Common bile duct: Diameter: 5 mm. There is no intrahepatic, common hepatic, or common bile duct dilatation.  Liver: No focal lesion identified. Liver echogenicity is diffusely increased.  IVC: No abnormality visualized.  Pancreas: Visualized portion unremarkable. Portions of pancreas are obscured by gas.  Spleen: Size and appearance within normal limits.  Right Kidney: Length: 11.5 cm. Echogenicity within normal limits. No mass or hydronephrosis visualized.  Left Kidney: Length: 10.5 cm. Echogenicity within normal limits. No hydronephrosis visualized. There is a cystic mass arising from the lower pole left kidney measuring 2.9 x 2.9 x 3.9 cm, also present on prior CT. A small solid-appearing mass arising from the lower pole left kidney seen on prior CT is not appreciable on this ultrasound examination.  Abdominal aorta: No aneurysm visualized.  Other findings: No demonstrable ascites.  IMPRESSION: Cholelithiasis and sludge in gallbladder.  Liver echogenicity is diffusely increased. This finding most likely is due to underlying hepatic steatosis, possibly with underlying parenchymal disease. While no focal liver lesions are identified, it must be cautioned that the sensitivity of ultrasound for focal liver lesions is diminished in this circumstance.  Stable appearing cystic lesion arising from lower pole  left kidney. Note that a small solid appearing lesion arising from lower pole left kidney seen on prior CT is not appreciable on this study. Given this circumstance, CT or MR pre and post intravenous contrast is advised to confirm stability of this apparent lesion seen in the lower pole left kidney on prior CT.  Pancreas partially obscured by gas. Visualized portions of pancreas appear normal.   Electronically Signed   By: Bretta Bang III M.D.   On: 03/22/2015 09:30   Ct Foot Right Wo Contrast  03/22/2015   CLINICAL DATA:  Right foot swelling and generalized weakness. Pedal edema.  EXAM: CT OF THE RIGHT FOOT WITHOUT CONTRAST  TECHNIQUE: Multidetector CT imaging of the right foot was performed according to the standard protocol. Multiplanar CT image  reconstructions were also generated.  COMPARISON:  Right foot radiographs 03/21/2015  FINDINGS: Tiny calcification demonstrated medial to the distal aspect proximal phalanx of the first toe. This likely represents heterotopic calcification versus small osseous fragment. No donor site is identified. Soft tissue swelling along the dorsum of the foot. No soft tissue gas collections. No destructive bone lesions. No acute fracture or dislocation is appreciated.  IMPRESSION: Tiny calcification medial to the proximal phalanx of the first toe likely represents heterotopic calcification versus small osseous fragment. Soft tissue edema over the dorsum of the foot. No abscess identified.   Electronically Signed   By: Burman NievesWilliam  Stevens M.D.   On: 03/22/2015 02:08   Dg Foot Complete Right  03/21/2015   CLINICAL DATA:  Swelling to the right foot. Started about 3 weeks ago. No injury.  EXAM: RIGHT FOOT COMPLETE - 3+ VIEW  COMPARISON:  None.  FINDINGS: Diffuse bone demineralization. Soft tissue density in the right first toe medial to the distal proximal phalanx. This is nonspecific in could represent heterotopic ossification or foreign body. No evidence of acute fracture or  dislocation. Diffuse soft tissue swelling over the dorsum of the right foot. No soft tissue gas collections.  IMPRESSION: Possible radiopaque foreign body versus heterotopic ossification in the right first toe. Dorsal soft tissue swelling. No acute bony abnormalities.   Electronically Signed   By: Burman NievesWilliam  Stevens M.D.   On: 03/21/2015 23:40     CBC  Recent Labs Lab 03/21/15 2209 03/22/15 0714  WBC 7.2 4.3  HGB 11.8* 10.6*  HCT 34.2* 30.8*  PLT 127* 82*  MCV 124.8* 125.2*  MCH 43.1* 43.1*  MCHC 34.5 34.4  RDW 14.6 14.5  LYMPHSABS  --  1.7  MONOABS  --  0.3  EOSABS  --  0.1  BASOSABS  --  0.0    Chemistries   Recent Labs Lab 03/21/15 2209 03/22/15 0714  NA 137 137  K 2.6* 3.8  CL 94* 99*  CO2 31 29  GLUCOSE 97 93  BUN 5* <5*  CREATININE 0.54 0.49  CALCIUM 8.5* 8.0*  MG 1.7 1.4*  AST  --  127*  ALT  --  21  ALKPHOS  --  125  BILITOT  --  1.9*   ------------------------------------------------------------------------------------------------------------------ estimated creatinine clearance is 67 mL/min (by C-G formula based on Cr of 0.49). ------------------------------------------------------------------------------------------------------------------ No results for input(s): HGBA1C in the last 72 hours. ------------------------------------------------------------------------------------------------------------------ No results for input(s): CHOL, HDL, LDLCALC, TRIG, CHOLHDL, LDLDIRECT in the last 72 hours. ------------------------------------------------------------------------------------------------------------------ No results for input(s): TSH, T4TOTAL, T3FREE, THYROIDAB in the last 72 hours.  Invalid input(s): FREET3 ------------------------------------------------------------------------------------------------------------------ No results for input(s): VITAMINB12, FOLATE, FERRITIN, TIBC, IRON, RETICCTPCT in the last 72 hours.  Coagulation profile No  results for input(s): INR, PROTIME in the last 168 hours.  No results for input(s): DDIMER in the last 72 hours.  Cardiac Enzymes  Recent Labs Lab 03/21/15 2209 03/22/15 0314  TROPONINI 0.03 0.03   ------------------------------------------------------------------------------------------------------------------ Invalid input(s): POCBNP     Time Spent in minutes   30 minutes   Uriel Horkey M.D on 03/22/2015 at 10:15 AM  Between 7am to 7pm - Pager - [phone]  After 7pm go to [email or web address] - password [email or web address]  [facility]   Office  [phone]

## 2015-03-23 LAB — HEPATITIS PANEL, ACUTE
HCV Ab: 0.1 s/co ratio (ref 0.0–0.9)
HEP A IGM: NEGATIVE
HEP B C IGM: NEGATIVE
Hepatitis B Surface Ag: NEGATIVE

## 2015-05-25 ENCOUNTER — Emergency Department (HOSPITAL_COMMUNITY): Payer: Medicaid Other

## 2015-05-25 ENCOUNTER — Encounter (HOSPITAL_COMMUNITY): Payer: Self-pay | Admitting: Cardiology

## 2015-05-25 ENCOUNTER — Inpatient Hospital Stay (HOSPITAL_COMMUNITY)
Admission: EM | Admit: 2015-05-25 | Discharge: 2015-06-02 | DRG: 871 | Disposition: A | Discharge status: Home Health | Payer: Medicaid Other | Attending: Internal Medicine | Admitting: Internal Medicine

## 2015-05-25 DIAGNOSIS — E871 Hypo-osmolality and hyponatremia: Secondary | ICD-10-CM | POA: Diagnosis present

## 2015-05-25 DIAGNOSIS — I10 Essential (primary) hypertension: Secondary | ICD-10-CM | POA: Diagnosis present

## 2015-05-25 DIAGNOSIS — Z806 Family history of leukemia: Secondary | ICD-10-CM | POA: Diagnosis not present

## 2015-05-25 DIAGNOSIS — K7030 Alcoholic cirrhosis of liver without ascites: Secondary | ICD-10-CM | POA: Diagnosis present

## 2015-05-25 DIAGNOSIS — D7589 Other specified diseases of blood and blood-forming organs: Secondary | ICD-10-CM | POA: Diagnosis present

## 2015-05-25 DIAGNOSIS — S32010A Wedge compression fracture of first lumbar vertebra, initial encounter for closed fracture: Secondary | ICD-10-CM | POA: Diagnosis present

## 2015-05-25 DIAGNOSIS — F10239 Alcohol dependence with withdrawal, unspecified: Secondary | ICD-10-CM | POA: Diagnosis present

## 2015-05-25 DIAGNOSIS — Z9181 History of falling: Secondary | ICD-10-CM

## 2015-05-25 DIAGNOSIS — D638 Anemia in other chronic diseases classified elsewhere: Secondary | ICD-10-CM | POA: Diagnosis present

## 2015-05-25 DIAGNOSIS — J690 Pneumonitis due to inhalation of food and vomit: Secondary | ICD-10-CM | POA: Diagnosis present

## 2015-05-25 DIAGNOSIS — M7989 Other specified soft tissue disorders: Secondary | ICD-10-CM

## 2015-05-25 DIAGNOSIS — W19XXXA Unspecified fall, initial encounter: Secondary | ICD-10-CM | POA: Diagnosis present

## 2015-05-25 DIAGNOSIS — R4182 Altered mental status, unspecified: Secondary | ICD-10-CM | POA: Diagnosis present

## 2015-05-25 DIAGNOSIS — A047 Enterocolitis due to Clostridium difficile: Secondary | ICD-10-CM | POA: Diagnosis present

## 2015-05-25 DIAGNOSIS — K760 Fatty (change of) liver, not elsewhere classified: Secondary | ICD-10-CM | POA: Diagnosis present

## 2015-05-25 DIAGNOSIS — K7290 Hepatic failure, unspecified without coma: Secondary | ICD-10-CM | POA: Diagnosis present

## 2015-05-25 DIAGNOSIS — R0989 Other specified symptoms and signs involving the circulatory and respiratory systems: Secondary | ICD-10-CM

## 2015-05-25 DIAGNOSIS — D696 Thrombocytopenia, unspecified: Secondary | ICD-10-CM | POA: Diagnosis present

## 2015-05-25 DIAGNOSIS — D539 Nutritional anemia, unspecified: Secondary | ICD-10-CM | POA: Diagnosis present

## 2015-05-25 DIAGNOSIS — D689 Coagulation defect, unspecified: Secondary | ICD-10-CM | POA: Diagnosis present

## 2015-05-25 DIAGNOSIS — I248 Other forms of acute ischemic heart disease: Secondary | ICD-10-CM | POA: Diagnosis present

## 2015-05-25 DIAGNOSIS — F17210 Nicotine dependence, cigarettes, uncomplicated: Secondary | ICD-10-CM | POA: Diagnosis present

## 2015-05-25 DIAGNOSIS — R7881 Bacteremia: Secondary | ICD-10-CM | POA: Diagnosis present

## 2015-05-25 DIAGNOSIS — A4150 Gram-negative sepsis, unspecified: Secondary | ICD-10-CM | POA: Diagnosis present

## 2015-05-25 DIAGNOSIS — R627 Adult failure to thrive: Secondary | ICD-10-CM | POA: Diagnosis present

## 2015-05-25 DIAGNOSIS — B9620 Unspecified Escherichia coli [E. coli] as the cause of diseases classified elsewhere: Secondary | ICD-10-CM | POA: Diagnosis present

## 2015-05-25 DIAGNOSIS — R0602 Shortness of breath: Secondary | ICD-10-CM

## 2015-05-25 DIAGNOSIS — K921 Melena: Secondary | ICD-10-CM | POA: Diagnosis present

## 2015-05-25 DIAGNOSIS — E876 Hypokalemia: Secondary | ICD-10-CM | POA: Diagnosis present

## 2015-05-25 DIAGNOSIS — N179 Acute kidney failure, unspecified: Secondary | ICD-10-CM | POA: Diagnosis present

## 2015-05-25 DIAGNOSIS — E46 Unspecified protein-calorie malnutrition: Secondary | ICD-10-CM | POA: Diagnosis present

## 2015-05-25 DIAGNOSIS — D72829 Elevated white blood cell count, unspecified: Secondary | ICD-10-CM | POA: Diagnosis present

## 2015-05-25 DIAGNOSIS — R000 Tachycardia, unspecified: Secondary | ICD-10-CM | POA: Diagnosis present

## 2015-05-25 DIAGNOSIS — E860 Dehydration: Secondary | ICD-10-CM | POA: Diagnosis present

## 2015-05-25 DIAGNOSIS — Y92019 Unspecified place in single-family (private) house as the place of occurrence of the external cause: Secondary | ICD-10-CM | POA: Diagnosis not present

## 2015-05-25 DIAGNOSIS — T380X5A Adverse effect of glucocorticoids and synthetic analogues, initial encounter: Secondary | ICD-10-CM | POA: Diagnosis present

## 2015-05-25 DIAGNOSIS — G9340 Encephalopathy, unspecified: Secondary | ICD-10-CM | POA: Diagnosis present

## 2015-05-25 DIAGNOSIS — N390 Urinary tract infection, site not specified: Secondary | ICD-10-CM | POA: Diagnosis present

## 2015-05-25 DIAGNOSIS — S32019A Unspecified fracture of first lumbar vertebra, initial encounter for closed fracture: Secondary | ICD-10-CM | POA: Diagnosis present

## 2015-05-25 DIAGNOSIS — F1010 Alcohol abuse, uncomplicated: Secondary | ICD-10-CM | POA: Diagnosis present

## 2015-05-25 DIAGNOSIS — R7989 Other specified abnormal findings of blood chemistry: Secondary | ICD-10-CM

## 2015-05-25 DIAGNOSIS — R778 Other specified abnormalities of plasma proteins: Secondary | ICD-10-CM | POA: Diagnosis present

## 2015-05-25 DIAGNOSIS — A4151 Sepsis due to Escherichia coli [E. coli]: Principal | ICD-10-CM | POA: Diagnosis present

## 2015-05-25 DIAGNOSIS — A419 Sepsis, unspecified organism: Secondary | ICD-10-CM | POA: Diagnosis present

## 2015-05-25 HISTORY — DX: Reserved for concepts with insufficient information to code with codable children: IMO0002

## 2015-05-25 LAB — BASIC METABOLIC PANEL
ANION GAP: 15 (ref 5–15)
BUN: 24 mg/dL — ABNORMAL HIGH (ref 6–20)
CALCIUM: 7.4 mg/dL — AB (ref 8.9–10.3)
CO2: 23 mmol/L (ref 22–32)
CREATININE: 1.88 mg/dL — AB (ref 0.44–1.00)
Chloride: 91 mmol/L — ABNORMAL LOW (ref 101–111)
GFR, EST AFRICAN AMERICAN: 33 mL/min — AB (ref >60)
GFR, EST NON AFRICAN AMERICAN: 29 mL/min — AB (ref >60)
Glucose, Bld: 78 mg/dL (ref 65–99)
Potassium: 3 mmol/L — ABNORMAL LOW (ref 3.5–5.1)
Sodium: 129 mmol/L — ABNORMAL LOW (ref 135–145)

## 2015-05-25 LAB — CBC WITH DIFFERENTIAL/PLATELET
BASOS PCT: 0 % (ref 0–1)
Basophils Absolute: 0 10*3/uL (ref 0.0–0.1)
EOS ABS: 0 10*3/uL (ref 0.0–0.7)
EOS PCT: 0 % (ref 0–5)
HCT: 27.8 % — ABNORMAL LOW (ref 36.0–46.0)
Hemoglobin: 9.3 g/dL — ABNORMAL LOW (ref 12.0–15.0)
LYMPHS ABS: 0.7 10*3/uL (ref 0.7–4.0)
Lymphocytes Relative: 10 % — ABNORMAL LOW (ref 12–46)
MCH: 44.7 pg — AB (ref 26.0–34.0)
MCHC: 33.5 g/dL (ref 30.0–36.0)
MCV: 133.7 fL — AB (ref 78.0–100.0)
MONOS PCT: 5 % (ref 3–12)
Monocytes Absolute: 0.3 10*3/uL (ref 0.1–1.0)
NEUTROS PCT: 85 % — AB (ref 43–77)
Neutro Abs: 6.2 10*3/uL (ref 1.7–7.7)
PLATELETS: 44 10*3/uL — AB (ref 150–400)
RBC: 2.08 MIL/uL — AB (ref 3.87–5.11)
RDW: 17.5 % — AB (ref 11.5–15.5)
WBC: 7.2 10*3/uL (ref 4.0–10.5)

## 2015-05-25 LAB — LACTIC ACID, PLASMA: LACTIC ACID, VENOUS: 5 mmol/L — AB (ref 0.5–2.0)

## 2015-05-25 LAB — URINALYSIS, ROUTINE W REFLEX MICROSCOPIC
GLUCOSE, UA: NEGATIVE mg/dL
Nitrite: NEGATIVE
PH: 5.5 (ref 5.0–8.0)
Protein, ur: 100 mg/dL — AB
UROBILINOGEN UA: 2 mg/dL — AB (ref 0.0–1.0)

## 2015-05-25 LAB — URINE MICROSCOPIC-ADD ON

## 2015-05-25 LAB — PROCALCITONIN: Procalcitonin: 10.77 ng/mL

## 2015-05-25 LAB — PROTIME-INR
INR: 2.11 — AB (ref 0.00–1.49)
PROTHROMBIN TIME: 23.5 s — AB (ref 11.6–15.2)

## 2015-05-25 LAB — ETHANOL: Alcohol, Ethyl (B): <5 mg/dL (ref <5)

## 2015-05-25 LAB — FOLATE: Folate: 13.5 ng/mL (ref >5.9)

## 2015-05-25 LAB — IRON AND TIBC
Iron: 102 ug/dL (ref 28–170)
Saturation Ratios: 91 % — ABNORMAL HIGH (ref 10.4–31.8)
TIBC: 112 ug/dL — ABNORMAL LOW (ref 250–450)
UIBC: 10 ug/dL

## 2015-05-25 LAB — TROPONIN I
TROPONIN I: 0.17 ng/mL — AB (ref <0.031)
TROPONIN I: 0.16 ng/mL — AB (ref <0.031)
Troponin I: 0.12 ng/mL — ABNORMAL HIGH (ref <0.031)

## 2015-05-25 LAB — VITAMIN B12: Vitamin B-12: 418 pg/mL (ref 180–914)

## 2015-05-25 LAB — HEPATIC FUNCTION PANEL
ALT: 24 U/L (ref 14–54)
AST: 168 U/L — ABNORMAL HIGH (ref 15–41)
Albumin: 2.2 g/dL — ABNORMAL LOW (ref 3.5–5.0)
Alkaline Phosphatase: 72 U/L (ref 38–126)
BILIRUBIN DIRECT: 3.1 mg/dL — AB (ref 0.1–0.5)
BILIRUBIN INDIRECT: 2.5 mg/dL — AB (ref 0.3–0.9)
BILIRUBIN TOTAL: 5.6 mg/dL — AB (ref 0.3–1.2)
Total Protein: 6.9 g/dL (ref 6.5–8.1)

## 2015-05-25 LAB — TYPE AND SCREEN
ABO/RH(D): AB NEG
ANTIBODY SCREEN: NEGATIVE

## 2015-05-25 LAB — RETICULOCYTES
RBC.: 2.07 MIL/uL — AB (ref 3.87–5.11)
RETIC COUNT ABSOLUTE: 76.6 10*3/uL (ref 19.0–186.0)
Retic Ct Pct: 3.7 % — ABNORMAL HIGH (ref 0.4–3.1)

## 2015-05-25 LAB — LIPASE, BLOOD: LIPASE: 75 U/L — AB (ref 22–51)

## 2015-05-25 LAB — I-STAT CG4 LACTIC ACID, ED
LACTIC ACID, VENOUS: 3.9 mmol/L — AB (ref 0.5–2.0)
Lactic Acid, Venous: 5.57 mmol/L (ref 0.5–2.0)

## 2015-05-25 LAB — POC OCCULT BLOOD, ED: Fecal Occult Bld: NEGATIVE

## 2015-05-25 LAB — AMMONIA: Ammonia: 38 umol/L — ABNORMAL HIGH (ref 9–35)

## 2015-05-25 LAB — POCT I-STAT TROPONIN I: Troponin i, poc: 0.21 ng/mL (ref 0.00–0.08)

## 2015-05-25 LAB — FERRITIN: FERRITIN: 1363 ng/mL — AB (ref 11–307)

## 2015-05-25 LAB — MAGNESIUM: Magnesium: 1.6 mg/dL — ABNORMAL LOW (ref 1.7–2.4)

## 2015-05-25 MED ORDER — SODIUM CHLORIDE 0.9 % IV BOLUS (SEPSIS)
1000.0000 mL | INTRAVENOUS | Status: AC
  Administered 2015-05-25 (×2): 1000 mL via INTRAVENOUS

## 2015-05-25 MED ORDER — SODIUM CHLORIDE 0.9 % IV SOLN
1000.0000 mL | Freq: Once | INTRAVENOUS | Status: AC
  Administered 2015-05-25: 1000 mL via INTRAVENOUS

## 2015-05-25 MED ORDER — FOLIC ACID 5 MG/ML IJ SOLN
INTRAMUSCULAR | Status: AC
  Filled 2015-05-25: qty 0.2

## 2015-05-25 MED ORDER — THIAMINE HCL 100 MG/ML IJ SOLN
INTRAMUSCULAR | Status: AC
  Filled 2015-05-25: qty 2

## 2015-05-25 MED ORDER — PIPERACILLIN-TAZOBACTAM 3.375 G IVPB 30 MIN
3.3750 g | Freq: Once | INTRAVENOUS | Status: AC
  Administered 2015-05-25: 3.375 g via INTRAVENOUS
  Filled 2015-05-25: qty 50

## 2015-05-25 MED ORDER — POTASSIUM CHLORIDE 10 MEQ/100ML IV SOLN
10.0000 meq | INTRAVENOUS | Status: AC
  Administered 2015-05-25 – 2015-05-26 (×6): 10 meq via INTRAVENOUS
  Filled 2015-05-25 (×6): qty 100

## 2015-05-25 MED ORDER — SODIUM CHLORIDE 0.9 % IJ SOLN
3.0000 mL | Freq: Two times a day (BID) | INTRAMUSCULAR | Status: DC
  Administered 2015-05-25 – 2015-06-02 (×9): 3 mL via INTRAVENOUS

## 2015-05-25 MED ORDER — ONDANSETRON HCL 4 MG/2ML IJ SOLN: 4.0000 mg | Freq: Four times a day (QID) | INTRAMUSCULAR | Status: DC | PRN

## 2015-05-25 MED ORDER — VANCOMYCIN HCL IN DEXTROSE 1-5 GM/200ML-% IV SOLN
1000.0000 mg | INTRAVENOUS | Status: DC
  Administered 2015-05-26 – 2015-05-27 (×2): 1000 mg via INTRAVENOUS
  Filled 2015-05-25 (×2): qty 200

## 2015-05-25 MED ORDER — SODIUM CHLORIDE 0.9 % IV SOLN
INTRAVENOUS | Status: DC
  Administered 2015-05-26 (×2): 1000 mL via INTRAVENOUS

## 2015-05-25 MED ORDER — CETYLPYRIDINIUM CHLORIDE 0.05 % MT LIQD
7.0000 mL | Freq: Two times a day (BID) | OROMUCOSAL | Status: DC
  Administered 2015-05-25 – 2015-06-02 (×11): 7 mL via OROMUCOSAL

## 2015-05-25 MED ORDER — PNEUMOCOCCAL VAC POLYVALENT 25 MCG/0.5ML IJ INJ
0.5000 mL | INJECTION | INTRAMUSCULAR | Status: DC
  Filled 2015-05-25: qty 0.5

## 2015-05-25 MED ORDER — ONDANSETRON HCL 4 MG PO TABS: 4.0000 mg | ORAL_TABLET | Freq: Four times a day (QID) | ORAL | Status: DC | PRN

## 2015-05-25 MED ORDER — SODIUM CHLORIDE 0.9 % IV SOLN: INTRAVENOUS | Status: DC

## 2015-05-25 MED ORDER — THIAMINE HCL 100 MG/ML IJ SOLN
Freq: Once | INTRAVENOUS | Status: AC
  Administered 2015-05-25: 22:00:00 via INTRAVENOUS
  Filled 2015-05-25: qty 1000

## 2015-05-25 MED ORDER — PIPERACILLIN-TAZOBACTAM 3.375 G IVPB
INTRAVENOUS | Status: AC
  Filled 2015-05-25: qty 50

## 2015-05-25 MED ORDER — VANCOMYCIN HCL IN DEXTROSE 1-5 GM/200ML-% IV SOLN
1000.0000 mg | Freq: Once | INTRAVENOUS | Status: AC
  Administered 2015-05-25: 1000 mg via INTRAVENOUS
  Filled 2015-05-25: qty 200

## 2015-05-25 MED ORDER — M.V.I. ADULT IV INJ
INJECTION | INTRAVENOUS | Status: AC
  Filled 2015-05-25: qty 10

## 2015-05-25 MED ORDER — OXYCODONE HCL 5 MG PO TABS
5.0000 mg | ORAL_TABLET | ORAL | Status: DC | PRN
  Administered 2015-05-26 – 2015-05-27 (×4): 5 mg via ORAL
  Filled 2015-05-25 (×4): qty 1

## 2015-05-25 MED ORDER — PIPERACILLIN-TAZOBACTAM 3.375 G IVPB
3.3750 g | Freq: Three times a day (TID) | INTRAVENOUS | Status: DC
  Administered 2015-05-26 – 2015-05-29 (×11): 3.375 g via INTRAVENOUS
  Filled 2015-05-25 (×17): qty 50

## 2015-05-25 MED ORDER — INFLUENZA VAC SPLIT QUAD 0.5 ML IM SUSY
0.5000 mL | PREFILLED_SYRINGE | INTRAMUSCULAR | Status: DC
  Filled 2015-05-25: qty 0.5

## 2015-05-25 MED ORDER — DIAZEPAM 5 MG/ML IJ SOLN
5.0000 mg | INTRAMUSCULAR | Status: DC | PRN
  Administered 2015-05-25 – 2015-05-28 (×3): 5 mg via INTRAVENOUS
  Administered 2015-05-29 – 2015-05-30 (×2): 10 mg via INTRAVENOUS
  Administered 2015-05-30: 5 mg via INTRAVENOUS
  Administered 2015-05-31: 10 mg via INTRAVENOUS
  Filled 2015-05-25 (×8): qty 2

## 2015-05-25 NOTE — Progress Notes (Signed)
ANTIBIOTIC CONSULT NOTE - INITIAL  Pharmacy Consult for vancomycin and zosyn Indication: rule out sepsis  Allergies  Allergen Reactions  . Loratadine Rash    Patient Measurements: Height:  (170.2 cm) Weight: 140 lb (63.504 kg) IBW/kg (Calculated) : 61.6   Vital Signs: Temp: 100.9 F (38.3 C) (09/13 1446) Temp Source: Rectal (09/13 1446) BP: 100/65 mmHg (09/13 1446) Pulse Rate: 124 (09/13 1530) Intake/Output from previous day:   Intake/Output from this shift:    Labs:  Recent Labs  05/25/15 1519  CREATININE 1.88*   Estimated Creatinine Clearance: 32.1 mL/min (by C-G formula based on Cr of 1.88). No results for input(s): VANCOTROUGH, VANCOPEAK, VANCORANDOM, GENTTROUGH, GENTPEAK, GENTRANDOM, TOBRATROUGH, TOBRAPEAK, TOBRARND, AMIKACINPEAK, AMIKACINTROU, AMIKACIN in the last 72 hours.   Microbiology: No results found for this or any previous visit (from the past 720 hour(s)).  Medical History: Past Medical History  Diagnosis Date  . Hypertension   . GERD (gastroesophageal reflux disease)   . Stroke 2008    left sided weakness  . Alcohol abuse   . Normal cardiac stress test 2008    myoview  . Hepatic steatosis   . Cholelithiasis     Medications:  See medication history Assessment: 59 yo lady to start empiric antibiotics for sepsis.  Her SrCr is elevated from baseline.  Vancomycin 1gm and zosyn 3.375 gm IV ordered in the ED  Goal of Therapy:  Vancomycin trough level 15-20 mcg/ml  Plan:  Cont zosyn 3.375 gm IV q8 hours Cont vancomycin 1gm IV q24 hours F/u renal function, cultures and clinical course  Thanks for allowing pharmacy to be a part of this patient's care.  Talbert Cage, PharmD Clinical Pharmacist 05/25/2015,4:08 PM

## 2015-05-25 NOTE — ED Notes (Signed)
CRITICAL VALUE ALERT  Critical value received:  Lactic Acid 5.0   Date of notification:  05/25/15  Time of notification:  1945  Critical value read back: Celso Sickle  Nurse who received alert:  Clarene Reamer  MD notified (1st page):  Pollina  Time of first page:  1945

## 2015-05-25 NOTE — ED Notes (Signed)
Family has not checked on family for several days.  Daughter found today in bed with altered LOC .  CBG 81.  B/p wnl.  Heart rate 126.  Pt jaundiced.  Upper abdomen tender. Pt has only drank vodka fot the last 5 days. Also on an antibiodic.  History of non compliance.  Last admission signed out AMA.

## 2015-05-25 NOTE — ED Notes (Signed)
Pt more alert at this time.  Talking and oriented.  States she feels better.

## 2015-05-25 NOTE — ED Provider Notes (Addendum)
CSN: [id number]     Arrival date & time 05/25/15  1441 History   First MD Initiated Contact with Patient 05/25/15 1446     Chief Complaint  Patient presents with  . Altered Mental Status     (Consider location/radiation/quality/duration/timing/severity/associated sxs/prior Treatment) HPI Comments: Patient brought to the ER for evaluation of mental status changes. Patient is brought to the ER by ambulance. Patient's daughter found her today in bed very confused. Patient is extremely weak, had been complaining of abdominal pain. Daughter reports the patient is an alcoholic, has been drinking vodka for the last 5 days. Patient also was recently on antibiotic for skin infection of right leg. Daughter reports that she do not take antibiotic as prescribed, however. Patient slowly responded arrival, but is oriented.  Patient is a 59 y.o. female presenting with altered mental status.  Altered Mental Status Associated symptoms: abdominal pain and weakness     Past Medical History  Diagnosis Date  . Hypertension   . GERD (gastroesophageal reflux disease)   . Stroke 2008    left sided weakness  . Alcohol abuse   . Normal cardiac stress test 2008    myoview  . Hepatic steatosis   . Cholelithiasis    Past Surgical History  Procedure Laterality Date  . Esophagus stretched    . Appendectomy     Family History  Problem Relation Age of Onset  . Leukemia Mother    Social History  Substance Use Topics  . Smoking status: Current Every Day Smoker -- 1.50 packs/day for 40 years    Types: Cigarettes  . Smokeless tobacco: None  . Alcohol Use: 12.0 oz/week    20 Shots of liquor per week     Comment: daily   OB History    No data available     Review of Systems  Constitutional: Positive for fatigue.  Gastrointestinal: Positive for abdominal pain.  Neurological: Positive for weakness.  All other systems reviewed and are negative.     Allergies  Loratadine  Home Medications    Prior to Admission medications   Medication Sig Start Date End Date Taking? Authorizing Provider  albuterol (PROVENTIL HFA;VENTOLIN HFA) 108 (90 BASE) MCG/ACT inhaler Inhale 1-2 puffs into the lungs every 6 (six) hours as needed for wheezing. 09/25/11 03/21/15  [name], MD   BP 119/65 mmHg  Pulse 120  Temp(Src) 100.9 F (38.3 C) (Rectal)  Resp 25  Ht  (1.702 m)  Wt 140 lb (63.504 kg)  BMI 21.92 kg/m2  SpO2 98% Physical Exam  Constitutional: She is oriented to person, place, and time. She appears well-developed and well-nourished. No distress.  HENT:  Head: Normocephalic and atraumatic.  Right Ear: Hearing normal.  Left Ear: Hearing normal.  Nose: Nose normal.  Mouth/Throat: Oropharynx is clear and moist and mucous membranes are normal.  Eyes: Conjunctivae and EOM are normal. Pupils are equal, round, and reactive to light.  Neck: Normal range of motion. Neck supple.  Cardiovascular: Regular rhythm, S1 normal and S2 normal.  Exam reveals no gallop and no friction rub.   No murmur heard. Pulmonary/Chest: Effort normal and breath sounds normal. No respiratory distress. She exhibits no tenderness.  Abdominal: Soft. Normal appearance and bowel sounds are normal. There is no hepatosplenomegaly. There is generalized tenderness. There is no rebound, no guarding, no tenderness at McBurney's point and negative Murphy's sign. No hernia.  Musculoskeletal: Normal range of motion.  Neurological: She is alert and oriented to person, place, and time.  She has normal strength. No cranial nerve deficit or sensory deficit. Coordination normal. GCS eye subscore is 4. GCS verbal subscore is 5. GCS motor subscore is 6.  Skin: Skin is warm, dry and intact. No rash noted. No cyanosis.  Psychiatric: She has a normal mood and affect. Her speech is normal and behavior is normal. Thought content normal.  Nursing note and vitals reviewed.   ED Course  Procedures (including critical care  time) Labs Review Labs Reviewed  URINALYSIS, ROUTINE W REFLEX MICROSCOPIC (NOT AT [facility]) - Abnormal; Notable for the following:    Color, Urine AMBER (*)    APPearance HAZY (*)    Specific Gravity, Urine >1.030 (*)    Hgb urine dipstick LARGE (*)    Bilirubin Urine MODERATE (*)    Ketones, ur TRACE (*)    Protein, ur 100 (*)    Urobilinogen, UA 2.0 (*)    Leukocytes, UA TRACE (*)    All other components within normal limits  LIPASE, BLOOD - Abnormal; Notable for the following:    Lipase 75 (*)    All other components within normal limits  TROPONIN I - Abnormal; Notable for the following:    Troponin I 0.17 (*)    All other components within normal limits  HEPATIC FUNCTION PANEL - Abnormal; Notable for the following:    Albumin 2.2 (*)    AST 168 (*)    Total Bilirubin 5.6 (*)    Bilirubin, Direct 3.1 (*)    Indirect Bilirubin 2.5 (*)    All other components within normal limits  BASIC METABOLIC PANEL - Abnormal; Notable for the following:    Sodium 129 (*)    Potassium 3.0 (*)    Chloride 91 (*)    BUN 24 (*)    Creatinine, Ser 1.88 (*)    Calcium 7.4 (*)    GFR calc non Af Amer 29 (*)    GFR calc Af Amer 33 (*)    All other components within normal limits  AMMONIA - Abnormal; Notable for the following:    Ammonia 38 (*)    All other components within normal limits  CBC WITH DIFFERENTIAL/PLATELET - Abnormal; Notable for the following:    RBC 2.08 (*)    Hemoglobin 9.3 (*)    HCT 27.8 (*)    MCV 133.7 (*)    MCH 44.7 (*)    RDW 17.5 (*)    Platelets 44 (*)    Neutrophils Relative % 85 (*)    Lymphocytes Relative 10 (*)    All other components within normal limits  PROTIME-INR - Abnormal; Notable for the following:    Prothrombin Time 23.5 (*)    INR 2.11 (*)    All other components within normal limits  URINE MICROSCOPIC-ADD ON - Abnormal; Notable for the following:    Squamous Epithelial / LPF FEW (*)    Bacteria, UA MANY (*)    All other components within  normal limits  I-STAT CG4 LACTIC ACID, ED - Abnormal; Notable for the following:    Lactic Acid, Venous 5.57 (*)    All other components within normal limits  CULTURE, BLOOD (ROUTINE X 2)  CULTURE, BLOOD (ROUTINE X 2)  URINE CULTURE  CBC WITH DIFFERENTIAL/PLATELET  ETHANOL  POC OCCULT BLOOD, ED  TYPE AND SCREEN    Imaging Review Ct Head Wo Contrast  05/25/2015   CLINICAL DATA:  Altered mental status.  Shortness of breath.  EXAM: CT HEAD WITHOUT CONTRAST  TECHNIQUE: Contiguous axial images were obtained from the base of the skull through the vertex without intravenous contrast.  COMPARISON:  03/24/2010  FINDINGS: Old infarct with encephalomalacia in the left frontal lobe. Stable hypodensities throughout the deep white matter compatible with chronic small vessel disease. No acute infarct. No hemorrhage or hydrocephalus. No mass lesion or midline shift.  Visualized paranasal sinuses and mastoids clear. Orbital soft tissues unremarkable.  IMPRESSION: Old left frontal infarct.  Chronic small vessel disease.  No acute intracranial abnormality.   Electronically Signed   By: Charlett Nose M.D.   On: 05/25/2015 16:34   Dg Chest Port 1 View  05/25/2015   CLINICAL DATA:  Short of breath. Severe abdominal pain and shortness of breath.  EXAM: PORTABLE CHEST - 1 VIEW  COMPARISON:  03/21/2015.  FINDINGS: Low volume chest. This accentuates the size of the cardiopericardial silhouette which is probably within normal limits. Bilateral basilar opacity probably represents atelectasis based on the low lung volumes. No gross focal consolidation. Monitoring leads project over the chest.  IMPRESSION: Low volume chest.   Electronically Signed   By: Andreas Newport M.D.   On: 05/25/2015 15:27   I have personally reviewed and evaluated these images and lab results as part of my medical decision-making.   EKG Interpretation   Date/Time:  Tuesday May 25 2015 14:50:08 EDT Ventricular Rate:  132 PR Interval:   126 QRS Duration: 96 QT Interval:  436 QTC Calculation: 646 R Axis:   19 Text Interpretation:  Sinus tachycardia Minimal ST depression, lateral  leads Prolonged QT interval Confirmed by Blinda Leatherwood  MD, Sherry Blackard [phone])  on 05/25/2015 3:10:49 PM      MDM   Final diagnoses:  None   sepsis  Patient presents to the ER for evaluation from home. Patient is brought to the ER by ambulance. Daughter reports that she had not seen the patient for several days, patient has a significant other that cares for her. With her daughter checked on her today she found her altered, confused and extremely weak. Daughter does report that she was recently told that she had abnormal liver function tests, but never did the follow-up that was recommended. She continues to drink and has been heavily drinking in the last few days.  Since slightly altered at arrival. She appears extremely dry and dehydrated. She is oriented, however. She does not have any specific complaints at arrival. She did have diffuse abdominal tenderness but no significant distention upon arrival. As she was administered IV fluids and antibiotic's, per sepsis protocol, she improved. Patient is now more alert. She continues to have no specific complaints. Repeat examination of the abdomen is minimally tender. I do not, however, have an obvious source for sepsis. The leg was reportedly infected appears to have improved significantly, no signs of soft tissue or skin infection currently. Urinalysis does suggest infection, but is not overtly infected. Patient does have an increased respiratory rate without significant hypoxia. She was, however, hypoxic for EMS transport. Room air oxygen saturations were initially 88-90%. Chest x-ray does not show obvious pneumonia. The patient is significantly dehydrated, however, she might not be manifesting radiologic changes from pneumonia.  I do not think that the patient requires imaging of her abdomen at this time.  Repeat examination is benign. Renal function is elevated, likely secondary to profound dehydration. Cannot receive IV contrast at this time. Ultrasound does not currently present. Would hydrate overnight, recheck creatinine and perform CT scan in the morning if there is any  pain or tenderness on examination.  Discussed with Dr. Konrad Dolores, on call for hospitalist program.  CRITICAL CARE Performed by: Gilda Crease   Total critical care time:  Critical care time was exclusive of separately billable procedures and treating other patients.  Critical care was necessary to treat or prevent imminent or life-threatening deterioration.  Critical care was time spent personally by me on the following activities: development of treatment plan with patient and/or surrogate as well as nursing, discussions with consultants, evaluation of patient's response to treatment, examination of patient, obtaining history from patient or surrogate, ordering and performing treatments and interventions, ordering and review of laboratory studies, ordering and review of radiographic studies, pulse oximetry and re-evaluation of patient's condition.    Gilda Crease, MD 05/25/15 2125  Gilda Crease, MD 05/25/15 2127

## 2015-05-25 NOTE — H&P (Addendum)
[facility] History and Physical  [name] WJX:[id number] DOB: [birth year] DOA: 05/25/2015  Referring physician: Dr Blinda Leatherwood - [facility] PCP: Vertis Kelch, NP   Chief Complaint: AMS  HPI: [name] is a 59 y.o. female  Level 5 Caveat: Pt presenting in altered mental state. History provided by ED physician, PT, and pts daughter  Per report pt has been very ill over the last 4-5 days. She has eaten little to nothing over this period of time. She's had multiple daily bouts of nonbloody emesis and diarrhea, which is getting worse.. This has been associated with complaint of stomach pain and per patient's daughter she says that the patient's abdomen is tender to palpation. Unsure of patient's last alcoholic drink, though at baseline she drinks very heavily. Of note over the last 3 weeks or so patient has been seen in [place] for lower extremity leg wounds for which she was receiving antibiotics. Patient's daughter states that she's been taking the antibiotics sporadically and was unsure of what her last dose was. She is also been fairly weak in general and has fallen multiple times over this period of time per family.  Patient is unaware of why she is in the hospital other than that she states she feels weak and dizzy and has fallen a time or 2. She is alert and oriented to person and place only  States her last alcohol beverage was on 05/21/2015.    Review of Systems:  Unable to give further review of systems outside of what is mentioned in the history of present illness as patient currently states that she is symptom free other than feeling a little weak.  Past Medical History  Diagnosis Date  . Hypertension   . GERD (gastroesophageal reflux disease)   . Stroke 2008    left sided weakness  . Alcohol abuse   . Normal cardiac stress test 2008    myoview  . Hepatic steatosis   . Cholelithiasis    Past Surgical History  Procedure Laterality Date  . Esophagus stretched    .  Appendectomy     Social History:  reports that she has been smoking Cigarettes.  She has a 60 pack-year smoking history. She does not have any smokeless tobacco history on file. She reports that she drinks about 12.0 oz of alcohol per week. She reports that she does not use illicit drugs.  Allergies  Allergen Reactions  . Loratadine Rash    Family History  Problem Relation Age of Onset  . Leukemia Mother      Prior to Admission medications   Medication Sig Start Date End Date Taking? Authorizing Provider  albuterol (PROVENTIL HFA;VENTOLIN HFA) 108 (90 BASE) MCG/ACT inhaler Inhale 1-2 puffs into the lungs every 6 (six) hours as needed for wheezing. 09/25/11 03/21/15  [name], MD   Physical Exam: Filed Vitals:   05/25/15 1730 05/25/15 1800 05/25/15 1815 05/25/15 1830  BP: 101/64 118/66 118/70 121/70  Pulse: 122 117 119 119  Temp:      TempSrc:      Resp: 17 25 32 33  Height:      Weight:      SpO2: 99% 99% 99% 99%    Wt Readings from Last 3 Encounters:  05/25/15 63.504 kg (140 lb)  03/21/15 63.504 kg (140 lb)  10/08/14 64.864 kg (143 lb)    General: weak. appearing Eyes:  PERRL, normal lids, scleral injection ENT: dry mm Neck:  no LAD, masses or thyromegaly Cardiovascular: RRR,  tachycardic, III/VI systolic murmur. No LE edema. Telemetry:  SR, no arrhythmias  Respiratory:  CTA bilaterally, no w/r/r. Tachypnea  Abdomen:  soft, ntnd, NABS Skin: Jaundiced,  no rash or induration seen on limited exam Musculoskeletal:  grossly normal tone BUE/BLE Psychiatric:  Attempts to answer questions approriately, but becomes very tangential Neurologic: No focal deficits, CN grossly intact. Moves all extremities in coordinated fashion           Labs on Admission:  Basic Metabolic Panel:  Recent Labs Lab 05/25/15 1519  NA 129*  K 3.0*  CL 91*  CO2 23  GLUCOSE 78  BUN 24*  CREATININE 1.88*  CALCIUM 7.4*   Liver Function Tests:  Recent Labs Lab 05/25/15 1519    AST 168*  ALT 24  ALKPHOS 72  BILITOT 5.6*  PROT 6.9  ALBUMIN 2.2*    Recent Labs Lab 05/25/15 1519  LIPASE 75*    Recent Labs Lab 05/25/15 1530  AMMONIA 38*   CBC:  Recent Labs Lab 05/25/15 1612  WBC 7.2  NEUTROABS 6.2  HGB 9.3*  HCT 27.8*  MCV 133.7*  PLT 44*   Cardiac Enzymes:  Recent Labs Lab 05/25/15 1519  TROPONINI 0.17*    BNP (last 3 results)  Recent Labs  03/21/15 2209  BNP 53.0    ProBNP (last 3 results) No results for input(s): PROBNP in the last 8760 hours.  CBG: No results for input(s): GLUCAP in the last 168 hours.  Radiological Exams on Admission: Ct Head Wo Contrast  05/25/2015   CLINICAL DATA:  Altered mental status.  Shortness of breath.  EXAM: CT HEAD WITHOUT CONTRAST  TECHNIQUE: Contiguous axial images were obtained from the base of the skull through the vertex without intravenous contrast.  COMPARISON:  03/24/2010  FINDINGS: Old infarct with encephalomalacia in the left frontal lobe. Stable hypodensities throughout the deep white matter compatible with chronic small vessel disease. No acute infarct. No hemorrhage or hydrocephalus. No mass lesion or midline shift.  Visualized paranasal sinuses and mastoids clear. Orbital soft tissues unremarkable.  IMPRESSION: Old left frontal infarct.  Chronic small vessel disease.  No acute intracranial abnormality.   Electronically Signed   By: Charlett Nose M.D.   On: 05/25/2015 16:34   Dg Chest Port 1 View  05/25/2015   CLINICAL DATA:  Short of breath. Severe abdominal pain and shortness of breath.  EXAM: PORTABLE CHEST - 1 VIEW  COMPARISON:  03/21/2015.  FINDINGS: Low volume chest. This accentuates the size of the cardiopericardial silhouette which is probably within normal limits. Bilateral basilar opacity probably represents atelectasis based on the low lung volumes. No gross focal consolidation. Monitoring leads project over the chest.  IMPRESSION: Low volume chest.   Electronically Signed   By:  Andreas Newport M.D.   On: 05/25/2015 15:27    Assessment/Plan Principal Problem:   Sepsis Active Problems:   Macrocytic anemia   Cirrhosis   Acute encephalopathy   Thrombocytopenia   Elevated troponin   ETOH abuse   Acute Encephalopathy: likely multifactorial - alcohol withdrawal, infection, severe dehydration. Lactic acid 5.57, ammonia 38, WBC 7.2 (but L shift w/ 85% neutrophils) Febrile, tachypneic, tachycardic area and blood pressure stable. Patient meets sepsis criteria and sepsis protocol was begun in the ED. There is some concern for possible GI etiology the patient is without any abdominal complaints at this time (Cdiff, SBP). Per report patient has been off and on antibiotic for last couple of weeks (for a presumed leg infection) and  has had diffuse vomiting and diarrhea for several days. Urine not overly positive for UTI - unlikely etiology.  - Stepdown - Continue the Vanc Zosyn - CT abdomen pelvis without contrast (AKI) - f/u BCX, f/u UCX - NS IVF,  - Cdiff  Cirrhosis: suspect ETOH abuse. Jaundiced. H/o steatosis only on chart. AST 168, ALT 24. Bilirubin 5.6. Cr 1.88, Hgb 9.3, INR 2.11. Hepatitis panel from 03/2015 nml. MELD score 34 (56% mortality in 3 mo) - slightly scewed due to dehydration/AKI - ETOH cessation counseling - GI outpt f/u if not already seeing GI.  - consider lasix, spiro, bblocker, etc. - Banana bag  AKI: Cr 2.9. Baseline 0.9. Likely from GI loss and no oral intake for days - IVF - BMET in am  Thrombocytopenia: suspect hepatic etiology given ETOH abuse hx.  - CBC in am  Hyponatremia/Hypokalemia: Na 129 on admission. K 3.0. Typically nml.  Likely from acute GI loss and malnutrition from ETOH - IVF NS - KCL x6 - Mag level.   Elevated Troponin: 0.17 on admission.Troponin: 0.17 on admiss EKG w/o ACS. Likely demand related.  - Trend Trop - Tele - EKG in am  Macrocytic Anemia: Hgb 9.3, MCV 133.7. Baseilne 12. Suspect malnutrition and  Cirrhosis  - Anemia panel  ETOH abuse: chronic condition for pt. Last ETOH on 05/21/15 per pt. ETOH level nml. - CIWA   Code Status: FULL DVT Prophylaxis: SCD Family Communication: Daughter Disposition Plan: Pending improvement  Lyberti Thrush Shela Commons, MD Family Medicine [facility] [email or web address] Password [email or web address]

## 2015-05-26 ENCOUNTER — Encounter (HOSPITAL_COMMUNITY): Payer: Self-pay | Admitting: Internal Medicine

## 2015-05-26 ENCOUNTER — Inpatient Hospital Stay (HOSPITAL_COMMUNITY): Payer: Medicaid Other

## 2015-05-26 DIAGNOSIS — S32010A Wedge compression fracture of first lumbar vertebra, initial encounter for closed fracture: Secondary | ICD-10-CM | POA: Diagnosis present

## 2015-05-26 DIAGNOSIS — A419 Sepsis, unspecified organism: Secondary | ICD-10-CM

## 2015-05-26 DIAGNOSIS — R0600 Dyspnea, unspecified: Secondary | ICD-10-CM

## 2015-05-26 LAB — COMPREHENSIVE METABOLIC PANEL
ALK PHOS: 58 U/L (ref 38–126)
ALT: 23 U/L (ref 14–54)
AST: 163 U/L — ABNORMAL HIGH (ref 15–41)
Albumin: 1.9 g/dL — ABNORMAL LOW (ref 3.5–5.0)
Anion gap: 10 (ref 5–15)
BILIRUBIN TOTAL: 5.7 mg/dL — AB (ref 0.3–1.2)
BUN: 30 mg/dL — ABNORMAL HIGH (ref 6–20)
CALCIUM: 6.6 mg/dL — AB (ref 8.9–10.3)
CO2: 21 mmol/L — AB (ref 22–32)
CREATININE: 1.38 mg/dL — AB (ref 0.44–1.00)
Chloride: 104 mmol/L (ref 101–111)
GFR calc non Af Amer: 42 mL/min — ABNORMAL LOW (ref >60)
GFR, EST AFRICAN AMERICAN: 48 mL/min — AB (ref >60)
GLUCOSE: 74 mg/dL (ref 65–99)
Potassium: 3.6 mmol/L (ref 3.5–5.1)
SODIUM: 135 mmol/L (ref 135–145)
TOTAL PROTEIN: 6.1 g/dL — AB (ref 6.5–8.1)

## 2015-05-26 LAB — CBC
HEMATOCRIT: 25.5 % — AB (ref 36.0–46.0)
HEMOGLOBIN: 8.5 g/dL — AB (ref 12.0–15.0)
MCH: 45 pg — AB (ref 26.0–34.0)
MCHC: 33.3 g/dL (ref 30.0–36.0)
MCV: 134.9 fL — ABNORMAL HIGH (ref 78.0–100.0)
Platelets: 35 10*3/uL — ABNORMAL LOW (ref 150–400)
RBC: 1.89 MIL/uL — AB (ref 3.87–5.11)
RDW: 17.6 % — ABNORMAL HIGH (ref 11.5–15.5)
WBC: 6.1 10*3/uL (ref 4.0–10.5)

## 2015-05-26 LAB — RAPID URINE DRUG SCREEN, HOSP PERFORMED
AMPHETAMINES: NOT DETECTED
BARBITURATES: NOT DETECTED
BENZODIAZEPINES: POSITIVE — AB
Cocaine: NOT DETECTED
Opiates: NOT DETECTED
Tetrahydrocannabinol: NOT DETECTED

## 2015-05-26 LAB — LACTIC ACID, PLASMA
LACTIC ACID, VENOUS: 1.8 mmol/L (ref 0.5–2.0)
Lactic Acid, Venous: 2.6 mmol/L (ref 0.5–2.0)

## 2015-05-26 LAB — MRSA PCR SCREENING: MRSA by PCR: NEGATIVE

## 2015-05-26 LAB — APTT: aPTT: 40 seconds — ABNORMAL HIGH (ref 24–37)

## 2015-05-26 LAB — PROTIME-INR
INR: 1.87 — ABNORMAL HIGH (ref 0.00–1.49)
PROTHROMBIN TIME: 21.4 s — AB (ref 11.6–15.2)

## 2015-05-26 LAB — TROPONIN I: Troponin I: 0.15 ng/mL — ABNORMAL HIGH (ref <0.031)

## 2015-05-26 MED ORDER — IBUPROFEN 400 MG PO TABS
400.0000 mg | ORAL_TABLET | Freq: Four times a day (QID) | ORAL | Status: DC | PRN
  Administered 2015-05-26: 400 mg via ORAL
  Filled 2015-05-26: qty 1

## 2015-05-26 MED ORDER — PNEUMOCOCCAL VAC POLYVALENT 25 MCG/0.5ML IJ INJ
0.5000 mL | INJECTION | INTRAMUSCULAR | Status: AC
  Administered 2015-05-27: 0.5 mL via INTRAMUSCULAR

## 2015-05-26 MED ORDER — INFLUENZA VAC SPLIT QUAD 0.5 ML IM SUSY
0.5000 mL | PREFILLED_SYRINGE | INTRAMUSCULAR | Status: AC
  Administered 2015-05-27: 0.5 mL via INTRAMUSCULAR

## 2015-05-26 MED ORDER — MAGNESIUM SULFATE 2 GM/50ML IV SOLN
2.0000 g | Freq: Once | INTRAVENOUS | Status: AC
  Administered 2015-05-26: 2 g via INTRAVENOUS
  Filled 2015-05-26: qty 50

## 2015-05-26 NOTE — Clinical Social Work Note (Signed)
CSW attempted to assess patient, but patient could not be aroused and was extremely lethargic.  CSW will attempt to assess patient when she is more alert.    Annice Needy, [place] [phone]

## 2015-05-26 NOTE — Progress Notes (Signed)
[facility] PROGRESS NOTE  [name]:[id number] DOB: 1956/08/03 DOA: 05/25/2015 PCP: Vertis Kelch, NP  Assessment/Plan: Acute Encephalopathy: likely multifactorial - alcohol withdrawal, infection, severe dehydration. Remains lethargic but arousable. She is on CIWA protocol and received ativan during night.  Lactic acid trending down but she remains febrile, tachycardic area and blood pressure stable. Patient meets sepsis criteria and sepsis protocol was begun in the ED with soft but stable BP.  No vomiting or diarrhea since admission.CT abdomen pelvis without contrast (AKI) shows new compression fx and possible pna.   Continue the Vanc Zosyn day #2 continue fluids will send stool for studies when available.   Cirrhosis: suspect ETOH abuse. Chart review indicates hepatitis panel from 03/2015 nml. On admission  MELD score 34 (56% mortality in 3 mo) - slightly scewed due to dehydration/AKI. Will need outpt f/u with GI. Will consider lasix, spiro, bblocker  When BP can tolerate.  AKI: Cr trending down this am. Baseline 0.9. No urine output documented. Will order intake and output. No vomiting/diarrhea since admission. Continue IVF. Hold nephrotoxins and monitor. - BMET in am  Thrombocytopenia: likely hepatic etiology given ETOH abuse hx. chart review indicates current level lower than baseline. No s/sx bleeding. monitor  Hyponatremia/Hypokalemia: Improving Likely from acute GI loss and malnutrition from ETOH. Continue  IVF NS. Potassium and sodium repleted. Monitor.   Elevated Troponin: 0.17 on admission and stable x3. Initial  EKG w/o ACS. Repeat EKG unchanged.  Likely demand related. will obtain echo. Monitor.   Macrocytic Anemia: Hgb 9.3, MCV 133.7. Baseilne 12. Likely related to malnutrition and Cirrhosis. Folate and B12 within limits of normal.   ETOH abuse: chronic condition for pt. Last ETOH on 05/21/15 per pt. ETOH level nml. CIWA score 4 last evening.    Code Status:  full Family Communication: none present Disposition Plan: to be determined   Consultants:  none  Procedures:  none  Antibiotics:  Vancomycin 05/25/15>>  Zosyn 05/25/15>>>  HPI/Subjective: Very lethargic but will arouse to verbal stimuli. Attempts to answer question  Objective: Filed Vitals:   05/26/15 0800  BP: 106/47  Pulse: 135  Temp:   Resp: 42    Intake/Output Summary (Last 24 hours) at 05/26/15 0854 Last data filed at 05/26/15 0530  Gross per 24 hour  Intake   1924 ml  Output      0 ml  Net   1924 ml   Filed Weights   05/25/15 1446 05/25/15 2101 05/26/15 0500  Weight: 63.504 kg (140 lb) 62.8 kg (138 lb 7.2 oz) 65.5 kg (144 lb 6.4 oz)    Exam:   General:  Lethargic acutely ill appearing  Cardiovascular: tachycardia but regular no MGR  Respiratory: shallow with normal effort BS clear bilaterally  Abdomen: non-distedned sluggish BS non-tender  Musculoskeletal: no clubbing or cyanosis   Data Reviewed: Basic Metabolic Panel:  Recent Labs Lab 05/25/15 1519 05/25/15 2201 05/26/15 0430  NA 129*  --  135  K 3.0*  --  3.6  CL 91*  --  104  CO2 23  --  21*  GLUCOSE 78  --  74  BUN 24*  --  30*  CREATININE 1.88*  --  1.38*  CALCIUM 7.4*  --  6.6*  MG  --  1.6*  --    Liver Function Tests:  Recent Labs Lab 05/25/15 1519 05/26/15 0430  AST 168* 163*  ALT 24 23  ALKPHOS 72 58  BILITOT 5.6* 5.7*  PROT 6.9 6.1*  ALBUMIN  2.2* 1.9*    Recent Labs Lab 05/25/15 1519  LIPASE 75*    Recent Labs Lab 05/25/15 1530  AMMONIA 38*   CBC:  Recent Labs Lab 05/25/15 1612 05/26/15 0430  WBC 7.2 6.1  NEUTROABS 6.2  --   HGB 9.3* 8.5*  HCT 27.8* 25.5*  MCV 133.7* 134.9*  PLT 44* 35*   Cardiac Enzymes:  Recent Labs Lab 05/25/15 1519 05/25/15 1723 05/25/15 2201 05/26/15 0430  TROPONINI 0.17* 0.12* 0.16* 0.15*   BNP (last 3 results)  Recent Labs  03/21/15 2209  BNP 53.0    ProBNP (last 3 results) No results for input(s):  PROBNP in the last 8760 hours.  CBG: No results for input(s): GLUCAP in the last 168 hours.  Recent Results (from the past 240 hour(s))  Urine culture     Status: None (Preliminary result)   Collection Time: 05/25/15  3:12 PM  Result Value Ref Range Status   Specimen Description URINE, CATHETERIZED  Final   Special Requests NONE  Final   Culture   Final    CULTURE REINCUBATED FOR BETTER GROWTH Performed at [facility]    Report Status PENDING  Incomplete     Studies: Ct Abdomen Pelvis Wo Contrast  05/25/2015   CLINICAL DATA:  59-YEAR-OLD FEMALE WITH SEPSIS  EXAM: CT ABDOMEN AND PELVIS WITHOUT CONTRAST  TECHNIQUE: Multidetector CT imaging of the abdomen and pelvis was performed following the standard protocol without IV contrast.  COMPARISON:  CT DATED 07/22/2013  FINDINGS: Evaluation of this exam is limited in the absence of intravenous contrast. Evaluation is also limited due to streak artifact caused by patient's arms.  Bibasilar dependent subsegmental atelectasis/scarring. There is aortoiliac atherosclerotic disease. No intra-abdominal free air or free fluid.  Hepatomegaly with diffuse fatty liver. Subcentimeter right hepatic hypodense lesions is not characterized. The gallbladder is filled with stones. No pericholecystic fluid noted. Pancreas, spleen, adrenal glands appear unremarkable. There is no hydronephrosis or nephrolithiasis on either side. There is a grossly stable left renal inferior pole cyst. A 1.3 x 1.2 cm high attenuating lesions arising from the inferior pole of the left kidney appears grossly similar to prior study and not characterized on this noncontrast CT. Follow-up with ultrasound or MRI recommended for further characterization. There are mild bilateral perinephric stranding. Correlation with urinalysis recommended to exclude UTI. The urinary bladder is distended. A small calcified uterine fibroid noted.  Sigmoid diverticulosis with muscular hypertrophy. No active  inflammation. There is submucosal fat deposit in the cecum and ascending colon likely sequela of chronic inflammation. This appearance is similar to the prior study. No acute inflammatory changes identified. There is no evidence of bowel obstruction.  There is aortoiliac atherosclerotic disease. There is heavy calcification of the origin of the left renal artery. No portal venous gas identified. There is no adenopathy.  There is mild stranding of the subcutaneous soft tissues of the abdominal pelvic wall. Mild degenerative changes of the spine. There is compression fracture of the L1 vertebra with extension of the fracture through the superior and inferior endplates. This fracture is new compared to the prior study and appears to be acute or subacute. Clinical correlation is recommended. No retropulsed fragment identified.  IMPRESSION: L1 compression fracture, new from prior study and likely acute. Clinical correlation is recommended. No retropulsed fragment identified.  Bibasilar subsegmental atelectasis/ scarring. Pneumonia is not excluded.  Cholelithiasis.  No hydronephrosis or nephrolithiasis. Correlation with urinalysis recommended to exclude UTI.  Sigmoid diverticulosis. No evidence of bowel obstruction  or inflammation.   Electronically Signed   By: Elgie Collard M.D.   On: 05/25/2015 20:49   Ct Head Wo Contrast  05/25/2015   CLINICAL DATA:  Altered mental status.  Shortness of breath.  EXAM: CT HEAD WITHOUT CONTRAST  TECHNIQUE: Contiguous axial images were obtained from the base of the skull through the vertex without intravenous contrast.  COMPARISON:  03/24/2010  FINDINGS: Old infarct with encephalomalacia in the left frontal lobe. Stable hypodensities throughout the deep white matter compatible with chronic small vessel disease. No acute infarct. No hemorrhage or hydrocephalus. No mass lesion or midline shift.  Visualized paranasal sinuses and mastoids clear. Orbital soft tissues unremarkable.   IMPRESSION: Old left frontal infarct.  Chronic small vessel disease.  No acute intracranial abnormality.   Electronically Signed   By: Charlett Nose M.D.   On: 05/25/2015 16:34   Dg Chest Port 1 View  05/25/2015   CLINICAL DATA:  Short of breath. Severe abdominal pain and shortness of breath.  EXAM: PORTABLE CHEST - 1 VIEW  COMPARISON:  03/21/2015.  FINDINGS: Low volume chest. This accentuates the size of the cardiopericardial silhouette which is probably within normal limits. Bilateral basilar opacity probably represents atelectasis based on the low lung volumes. No gross focal consolidation. Monitoring leads project over the chest.  IMPRESSION: Low volume chest.   Electronically Signed   By: Andreas Newport M.D.   On: 05/25/2015 15:27    Scheduled Meds: . antiseptic oral rinse  7 mL Mouth Rinse BID  . Influenza vac split quadrivalent PF  0.5 mL Intramuscular Tomorrow-1000  . piperacillin-tazobactam (ZOSYN)  IV  3.375 g Intravenous Q8H  . pneumococcal 23 valent vaccine  0.5 mL Intramuscular Tomorrow-1000  . sodium chloride  3 mL Intravenous Q12H  . vancomycin  1,000 mg Intravenous Q24H   Continuous Infusions: . sodium chloride 1,000 mL (05/26/15 0715)    Principal Problem:   Sepsis Active Problems:   Macrocytic anemia   Cirrhosis   Acute encephalopathy   Thrombocytopenia   Elevated troponin   ETOH abuse    Time spent: 40 minutes    [facility] M  [facility] Pager [phone]. If 7PM-7AM, please contact night-coverage at [email or web address], password [facility] 05/26/2015, 8:54 AM  LOS: 1 day

## 2015-05-26 NOTE — Progress Notes (Signed)
SECOND BLOOD CULTURE HAS COME + FOR GRAM - RODS

## 2015-05-26 NOTE — Progress Notes (Signed)
ANTIBIOTIC CONSULT NOTE - INITIAL  Pharmacy Consult for vancomycin and zosyn Indication: rule out sepsis / bacteremia  Allergies  Allergen Reactions  . Loratadine Rash   Patient Measurements: Height:  (167.6 cm) Weight: 144 lb 6.4 oz (65.5 kg) IBW/kg (Calculated) : 59.3  Vital Signs: Temp: 101.2 F (38.4 C) (09/14 0747) Temp Source: Axillary (09/14 0747) BP: 116/48 mmHg (09/14 0900) Pulse Rate: 132 (09/14 0900) Intake/Output from previous day: 09/13 0701 - 09/14 0700 In: 1924 [I.V.:1839; IV Piggyback:85] Out: -  Intake/Output from this shift:    Labs:  Recent Labs  05/25/15 1519 05/25/15 1612 05/26/15 0430  WBC  --  7.2 6.1  HGB  --  9.3* 8.5*  PLT  --  44* 35*  CREATININE 1.88*  --  1.38*   Estimated Creatinine Clearance: 42.1 mL/min (by C-G formula based on Cr of 1.38). No results for input(s): VANCOTROUGH, VANCOPEAK, VANCORANDOM, GENTTROUGH, GENTPEAK, GENTRANDOM, TOBRATROUGH, TOBRAPEAK, TOBRARND, AMIKACINPEAK, AMIKACINTROU, AMIKACIN in the last 72 hours.   Microbiology: Recent Results (from the past 720 hour(s))  Urine culture     Status: None (Preliminary result)   Collection Time: 05/25/15  3:12 PM  Result Value Ref Range Status   Specimen Description URINE, CATHETERIZED  Final   Special Requests NONE  Final   Culture   Final    CULTURE REINCUBATED FOR BETTER GROWTH Performed at [facility]    Report Status PENDING  Incomplete  Blood Culture (routine x 2)     Status: None (Preliminary result)   Collection Time: 05/25/15  3:30 PM  Result Value Ref Range Status   Specimen Description BLOOD LEFT ARM  Final   Special Requests BOTTLES DRAWN AEROBIC AND ANAEROBIC 8CC  Final   Culture  Setup Time   Final    GRAM NEGATIVE RODS RECOVERED FROM BOTH BOTTLES Gram Stain Report Called to,Read Back By and Verified With: HEARN,J. AT 0615 ON 05/26/2015 BY RESSEGGER,R. Performed at [facility]    Culture PENDING  Incomplete   Report Status  PENDING  Incomplete  Blood Culture (routine x 2)     Status: None (Preliminary result)   Collection Time: 05/25/15  3:38 PM  Result Value Ref Range Status   Specimen Description BLOOD RIGHT HAND  Final   Special Requests BOTTLES DRAWN AEROBIC AND ANAEROBIC 5CC  Final   Culture  Setup Time   Final    GRAM NEGATIVE RODS RECOVERED FROM BOTH BOTTLES Gram Stain Report Called to,Read Back By and Verified With: PHILLIPS,C. AT 0800 ON 05/26/2015 BY RESSEGGER,R. Performed at [facility]    Culture PENDING  Incomplete   Report Status PENDING  Incomplete   Medical History: Past Medical History  Diagnosis Date  . Hypertension   . GERD (gastroesophageal reflux disease)   . Stroke 2008    left sided weakness  . Alcohol abuse   . Normal cardiac stress test 2008    myoview  . Hepatic steatosis   . Cholelithiasis   . Compression fracture     L1    Medications:  See medication history Assessment: 59 yo lady to start empiric antibiotics for sepsis.  Her SrCr is elevated from baseline on admission but has improved.  GNR noted in blood cultures 2/2 bottles.  Pt still with fever.    Goal of Therapy:  Vancomycin trough level 15-20 mcg/ml  Plan:  Cont zosyn 3.375 gm IV q8 hours Cont vancomycin 1gm IV q24 hours (may increase 9/15 if SCr improves) F/u  renal function, cultures and clinical course  Thanks for allowing pharmacy to be a part of this patient's care.  Valrie Hart, PharmD Clinical Pharmacist 05/26/2015,10:59 AM

## 2015-05-27 ENCOUNTER — Encounter (HOSPITAL_COMMUNITY): Payer: Self-pay | Admitting: Internal Medicine

## 2015-05-27 DIAGNOSIS — K7460 Unspecified cirrhosis of liver: Secondary | ICD-10-CM

## 2015-05-27 DIAGNOSIS — R7881 Bacteremia: Secondary | ICD-10-CM | POA: Diagnosis present

## 2015-05-27 DIAGNOSIS — A4150 Gram-negative sepsis, unspecified: Secondary | ICD-10-CM | POA: Diagnosis present

## 2015-05-27 LAB — COMPREHENSIVE METABOLIC PANEL
ALBUMIN: 1.6 g/dL — AB (ref 3.5–5.0)
ALK PHOS: 53 U/L (ref 38–126)
ALT: 34 U/L (ref 14–54)
AST: 193 U/L — AB (ref 15–41)
Anion gap: 9 (ref 5–15)
BILIRUBIN TOTAL: 5.7 mg/dL — AB (ref 0.3–1.2)
BUN: 49 mg/dL — AB (ref 6–20)
CO2: 22 mmol/L (ref 22–32)
Calcium: 7.1 mg/dL — ABNORMAL LOW (ref 8.9–10.3)
Chloride: 109 mmol/L (ref 101–111)
Creatinine, Ser: 1.14 mg/dL — ABNORMAL HIGH (ref 0.44–1.00)
GFR calc Af Amer: >60 mL/min (ref >60)
GFR calc non Af Amer: 52 mL/min — ABNORMAL LOW (ref >60)
GLUCOSE: 109 mg/dL — AB (ref 65–99)
POTASSIUM: 3.2 mmol/L — AB (ref 3.5–5.1)
Sodium: 140 mmol/L (ref 135–145)
TOTAL PROTEIN: 5.5 g/dL — AB (ref 6.5–8.1)

## 2015-05-27 LAB — CBC WITH DIFFERENTIAL/PLATELET
BASOS ABS: 0 10*3/uL (ref 0.0–0.1)
Basophils Relative: 1 %
Eosinophils Absolute: 0 10*3/uL (ref 0.0–0.7)
Eosinophils Relative: 1 %
HEMATOCRIT: 24.4 % — AB (ref 36.0–46.0)
HEMOGLOBIN: 8 g/dL — AB (ref 12.0–15.0)
LYMPHS PCT: 16 %
Lymphs Abs: 0.7 10*3/uL (ref 0.7–4.0)
MCH: 43.2 pg — ABNORMAL HIGH (ref 26.0–34.0)
MCHC: 32.8 g/dL (ref 30.0–36.0)
MCV: 131.9 fL — ABNORMAL HIGH (ref 78.0–100.0)
Monocytes Absolute: 0.6 10*3/uL (ref 0.1–1.0)
Monocytes Relative: 13 %
NEUTROS ABS: 3.1 10*3/uL (ref 1.7–7.7)
NEUTROS PCT: 70 %
Platelets: 36 10*3/uL — ABNORMAL LOW (ref 150–400)
RBC: 1.85 MIL/uL — AB (ref 3.87–5.11)
RDW: 16.8 % — AB (ref 11.5–15.5)
SMEAR REVIEW: DECREASED
WBC: 4.4 10*3/uL (ref 4.0–10.5)

## 2015-05-27 LAB — VITAMIN B12: Vitamin B-12: 616 pg/mL (ref 180–914)

## 2015-05-27 MED ORDER — POTASSIUM CHLORIDE CRYS ER 20 MEQ PO TBCR
40.0000 meq | EXTENDED_RELEASE_TABLET | Freq: Once | ORAL | Status: AC
  Administered 2015-05-27: 40 meq via ORAL
  Filled 2015-05-27: qty 2

## 2015-05-27 MED ORDER — ENSURE ENLIVE PO LIQD
237.0000 mL | Freq: Two times a day (BID) | ORAL | Status: DC
  Administered 2015-05-27 – 2015-06-01 (×7): 237 mL via ORAL

## 2015-05-27 NOTE — Clinical Social Work Note (Signed)
Clinical Social Work Assessment  Patient Details  Name: [name] MRN: [id number] Date of Birth: 1956-07-02  Date of referral:  05/27/15               Reason for consult:  Substance Use/ETOH Abuse                Permission sought to share information with:    Permission granted to share information::     Name::        Agency::     Relationship::     Contact Information:     Housing/Transportation Living arrangements for the past 2 months:  Single Family Home Source of Information:  Patient, Spouse Patient Interpreter Needed:  None Criminal Activity/Legal Involvement Pertinent to Current Situation/Hospitalization:  No - Comment as needed Significant Relationships:  Adult Children, Spouse Lives with:    Do you feel safe going back to the place where you live?  Yes Need for family participation in patient care:  Yes (Comment)  Care giving concerns:  At baseline there are no care giving concerns.  Patient's husband now feels that he will be unable to assist patient as he feels she may be weak due to this acute illness.   Social Worker assessment / plan:  CSW met with patient whose husband Brindle Leyba, was at bedside.  Patient indicated that CSW could speak about referral in her husband's presence. CSW discussed the purpose of the referral was alcohol use concerns. Patient reports that she resides in the home with her husband and she is unemployed. Patient indicated that at baseline she walks unassisted, drives "every now and then," and completes ADLs unassisted.  Mr. Diop advised that patient needs rehab for alcohol use.  He stated that Mr. Lemar Livings with [facility] has been trying to get patient to attend rehab.  He indicated that patient drinks vodka daily.  He stated that patient has never been to rehab.  Mr. Osment advised that patient may need a SNF because she has not eaten in 4-5 days and that he cannot lift her at home.  He stated that he would like for patient to attend  outpatient rehab.  Patient was lethargic and slow to respond.  She did indicate that she would consider outpatient rehab.  CSW provided outpatient and inpatient rehab facility information, AA schedule and alcohol fact sheet.  CSW will speak with patient again about rehab choices once she is less lethargic.   Employment status:  Unemployed Forensic scientist:  Self Pay (Medicaid Pending) PT Recommendations:  Not assessed at this time Information / Referral to community resources:  Residential Substance Abuse Treatment Options, Inpatient Psychiatric Care (Comment Required)  Patient/Family's Response to care:  Patient will consider outpatient rehab.  Patient/Family's Understanding of and Emotional Response to Diagnosis, Current Treatment, and Prognosis:  Patient's husband feels that patient does have an issue with alcohol.   Emotional Assessment Appearance:  Developmentally appropriate Attitude/Demeanor/Rapport:  Lethargic Affect (typically observed):  Calm Orientation:  Oriented to Self, Oriented to Place, Oriented to Situation Alcohol / Substance use:  Not Applicable Psych involvement (Current and /or in the community):  No (Comment)  Discharge Needs  Concerns to be addressed:  Discharge Planning Concerns Readmission within the last 30 days:  No Current discharge risk:  Substance Abuse Barriers to Discharge:  No Barriers Identified   Ihor Gully, LCSW 05/27/2015, 1:03 PM [phone]

## 2015-05-27 NOTE — Clinical Documentation Improvement (Signed)
Internal Medicine   Admitted with diagnosis of "Sepsis"  ,please provide diagnosis for source of infection ? Thank you   Leg/Skin infection  Other  Clinically Undetermined  Supporting Information: Positive blood cultures x2.   Please exercise your independent, professional judgment when responding. A specific answer is not anticipated or expected.   Thank You, Lavonda Jumbo Health Information Management [place] [phone]

## 2015-05-27 NOTE — Progress Notes (Signed)
[facility] PROGRESS NOTE  KYONNA FRIER ZOX:[id number] DOB: 09/08/1956 DOA: 05/25/2015 PCP: Vertis Kelch, NP  Assessment/Plan: Acute Encephalopathy: likely multifactorial - alcohol withdrawal, infection, severe dehydration. Much improved this am. CIWA score 2. Lactic acid normalized. She is afebrile with improved tachycardia and stable blood pressure. No vomiting or diarrhea since admission.CT abdomen pelvis without contrast (AKI) shows new compression fx and possible pna. Blood cultures with gram negative rod.  Continue the Vanc Zosyn day #3. continue fluids.   sepsis due to gram negative bacteremia: per blood culture. Continue anti-biotics as above. Improving as noted above. See #1. Will transfer to floor  Cirrhosis: suspect ETOH abuse. Chart review indicates hepatitis panel from 03/2015 nml. On admission MELD score 34 (56% mortality in 3 mo) - slightly scewed due to dehydration/AKI. Will need outpt f/u with GI. Will consider lasix, spiro, bblocker When BP can tolerate. BP remains somewhat soft.   AKI: Cr continues to  trend down this am. Baseline 0.9. Urine output good. Hold nephrotoxins and monitor. BMET in am  Thrombocytopenia: likely hepatic etiology given ETOH abuse hx. Stable. No s/sx bleeding. monitor  Hyponatremia/Hypokalemia: resolved. Related to  GI loss and malnutrition from ETOH. Continue IVF NS. Potassium and sodium repleted. Monitor.   Elevated Troponin: 0.17 on admission and stable x3. Initial EKG w/o ACS. Repeat EKG unchanged. Likely related to demand ischemia in setting of #2. Echo reveals wall thickness wasincreased in a pattern of mild LVH. Systolic function wasvigorous. The estimated ejection fraction was in the range of 70%to 75%. Wall motion was normal; there were no regional wallmotion abnormalities. Doppler parameters are consistent withabnormal left ventricular relaxation (grade 1 diastolicdysfunction). No chest pain.   Macrocytic Anemia: Hgb 8.0  likely influenced by vigorous IV fluids. Baseilne 12. Likely related to malnutrition and Cirrhosis. Folate and B12 within limits of normal.   ETOH abuse: chronic condition for pt. Last ETOH on 05/21/15 per pt. ETOH level nml. CIWA score 2 this am     Code Status: full Family Communication: none present Disposition Plan: home when ready   Consultants:  none  Procedures:  none  Antibiotics:  Vancomycin 05/25/15>>  Zosyn 05/25/15>>>  HPI/Subjective: Sitting up in bed eating. More alert. Denies pain.  Objective: Filed Vitals:   05/27/15 1100  BP: 109/58  Pulse: 103  Temp:   Resp: 13    Intake/Output Summary (Last 24 hours) at 05/27/15 1242 Last data filed at 05/27/15 0725  Gross per 24 hour  Intake 3332.5 ml  Output    825 ml  Net 2507.5 ml   Filed Weights   05/25/15 2101 05/26/15 0500 05/27/15 0500  Weight: 62.8 kg (138 lb 7.2 oz) 65.5 kg (144 lb 6.4 oz) 67.5 kg (148 lb 13 oz)    Exam:   General:  Appears chronically ill but in no distress  Cardiovascular: tachycardic but regular no MGR  Respiratory: normal effort BS clear clear without wheeze  Abdomen: soft non-distended non-tender   Musculoskeletal: no clubbing or cyanosis   Data Reviewed: Basic Metabolic Panel:  Recent Labs Lab 05/25/15 1519 05/25/15 2201 05/26/15 0430 05/27/15 0421  NA 129*  --  135 140  K 3.0*  --  3.6 3.2*  CL 91*  --  104 109  CO2 23  --  21* 22  GLUCOSE 78  --  74 109*  BUN 24*  --  30* 49*  CREATININE 1.88*  --  1.38* 1.14*  CALCIUM 7.4*  --  6.6* 7.1*  MG  --  1.6*  --   --    Liver Function Tests:  Recent Labs Lab 05/25/15 1519 05/26/15 0430 05/27/15 0421  AST 168* 163* 193*  ALT 24 23 34  ALKPHOS 72 58 53  BILITOT 5.6* 5.7* 5.7*  PROT 6.9 6.1* 5.5*  ALBUMIN 2.2* 1.9* 1.6*    Recent Labs Lab 05/25/15 1519  LIPASE 75*    Recent Labs Lab 05/25/15 1530  AMMONIA 38*   CBC:  Recent Labs Lab 05/25/15 1612 05/26/15 0430 05/27/15 0421  WBC  7.2 6.1 4.4  NEUTROABS 6.2  --  3.1  HGB 9.3* 8.5* 8.0*  HCT 27.8* 25.5* 24.4*  MCV 133.7* 134.9* 131.9*  PLT 44* 35* 36*   Cardiac Enzymes:  Recent Labs Lab 05/25/15 1519 05/25/15 1723 05/25/15 2201 05/26/15 0430  TROPONINI 0.17* 0.12* 0.16* 0.15*   BNP (last 3 results)  Recent Labs  03/21/15 2209  BNP 53.0    ProBNP (last 3 results) No results for input(s): PROBNP in the last 8760 hours.  CBG: No results for input(s): GLUCAP in the last 168 hours.  Recent Results (from the past 240 hour(s))  Urine culture     Status: None (Preliminary result)   Collection Time: 05/25/15  3:12 PM  Result Value Ref Range Status   Specimen Description URINE, CATHETERIZED  Final   Special Requests NONE  Final   Culture   Final    CULTURE REINCUBATED FOR BETTER GROWTH Performed at [facility]    Report Status PENDING  Incomplete  Blood Culture (routine x 2)     Status: None (Preliminary result)   Collection Time: 05/25/15  3:30 PM  Result Value Ref Range Status   Specimen Description BLOOD LEFT ARM  Final   Special Requests BOTTLES DRAWN AEROBIC AND ANAEROBIC 8CC  Final   Culture  Setup Time   Final    GRAM NEGATIVE RODS RECOVERED FROM BOTH BOTTLES Gram Stain Report Called to,Read Back By and Verified With: HEARN,J. AT 0615 ON 05/26/2015 BY RESSEGGER,R. Performed at [facility]    Culture   Final    GRAM NEGATIVE RODS Performed at [facility]    Report Status PENDING  Incomplete  Blood Culture (routine x 2)     Status: None (Preliminary result)   Collection Time: 05/25/15  3:38 PM  Result Value Ref Range Status   Specimen Description BLOOD RIGHT HAND  Final   Special Requests BOTTLES DRAWN AEROBIC AND ANAEROBIC 5CC  Final   Culture  Setup Time   Final    GRAM NEGATIVE RODS RECOVERED FROM BOTH BOTTLES Gram Stain Report Called to,Read Back By and Verified With: PHILLIPS,C. AT 0800 ON 05/26/2015 BY RESSEGGER,R. Performed at [facility]     Culture   Final    GRAM NEGATIVE RODS Performed at [facility]    Report Status PENDING  Incomplete  MRSA PCR Screening     Status: None   Collection Time: 05/25/15  8:40 PM  Result Value Ref Range Status   MRSA by PCR NEGATIVE NEGATIVE Final    Comment:        The GeneXpert MRSA Assay (FDA approved for NASAL specimens only), is one component of a comprehensive MRSA colonization surveillance program. It is not intended to diagnose MRSA infection nor to guide or monitor treatment for MRSA infections.      Studies: Ct Abdomen Pelvis Wo Contrast  05/25/2015   CLINICAL DATA:  59-YEAR-OLD FEMALE WITH SEPSIS  EXAM: CT ABDOMEN  AND PELVIS WITHOUT CONTRAST  TECHNIQUE: Multidetector CT imaging of the abdomen and pelvis was performed following the standard protocol without IV contrast.  COMPARISON:  CT DATED 07/22/2013  FINDINGS: Evaluation of this exam is limited in the absence of intravenous contrast. Evaluation is also limited due to streak artifact caused by patient's arms.  Bibasilar dependent subsegmental atelectasis/scarring. There is aortoiliac atherosclerotic disease. No intra-abdominal free air or free fluid.  Hepatomegaly with diffuse fatty liver. Subcentimeter right hepatic hypodense lesions is not characterized. The gallbladder is filled with stones. No pericholecystic fluid noted. Pancreas, spleen, adrenal glands appear unremarkable. There is no hydronephrosis or nephrolithiasis on either side. There is a grossly stable left renal inferior pole cyst. A 1.3 x 1.2 cm high attenuating lesions arising from the inferior pole of the left kidney appears grossly similar to prior study and not characterized on this noncontrast CT. Follow-up with ultrasound or MRI recommended for further characterization. There are mild bilateral perinephric stranding. Correlation with urinalysis recommended to exclude UTI. The urinary bladder is distended. A small calcified uterine fibroid noted.  Sigmoid  diverticulosis with muscular hypertrophy. No active inflammation. There is submucosal fat deposit in the cecum and ascending colon likely sequela of chronic inflammation. This appearance is similar to the prior study. No acute inflammatory changes identified. There is no evidence of bowel obstruction.  There is aortoiliac atherosclerotic disease. There is heavy calcification of the origin of the left renal artery. No portal venous gas identified. There is no adenopathy.  There is mild stranding of the subcutaneous soft tissues of the abdominal pelvic wall. Mild degenerative changes of the spine. There is compression fracture of the L1 vertebra with extension of the fracture through the superior and inferior endplates. This fracture is new compared to the prior study and appears to be acute or subacute. Clinical correlation is recommended. No retropulsed fragment identified.  IMPRESSION: L1 compression fracture, new from prior study and likely acute. Clinical correlation is recommended. No retropulsed fragment identified.  Bibasilar subsegmental atelectasis/ scarring. Pneumonia is not excluded.  Cholelithiasis.  No hydronephrosis or nephrolithiasis. Correlation with urinalysis recommended to exclude UTI.  Sigmoid diverticulosis. No evidence of bowel obstruction or inflammation.   Electronically Signed   By: Elgie Collard M.D.   On: 05/25/2015 20:49   Ct Head Wo Contrast  05/25/2015   CLINICAL DATA:  Altered mental status.  Shortness of breath.  EXAM: CT HEAD WITHOUT CONTRAST  TECHNIQUE: Contiguous axial images were obtained from the base of the skull through the vertex without intravenous contrast.  COMPARISON:  03/24/2010  FINDINGS: Old infarct with encephalomalacia in the left frontal lobe. Stable hypodensities throughout the deep white matter compatible with chronic small vessel disease. No acute infarct. No hemorrhage or hydrocephalus. No mass lesion or midline shift.  Visualized paranasal sinuses and  mastoids clear. Orbital soft tissues unremarkable.  IMPRESSION: Old left frontal infarct.  Chronic small vessel disease.  No acute intracranial abnormality.   Electronically Signed   By: Charlett Nose M.D.   On: 05/25/2015 16:34   Dg Chest Port 1 View  05/26/2015   CLINICAL DATA:  Shortness of breath.  Pneumonia.  Fever.  EXAM: PORTABLE CHEST - 1 VIEW  COMPARISON:  05/25/2015 and 03/21/2015  FINDINGS: The patient has developed dense consolidation at the left lung base. There may be an adjacent small effusion as well. Right lung is clear. Heart size and vascularity remain normal. No acute osseous abnormality.  IMPRESSION: Interval consolidation of the left lower lobe consistent  with pneumonia. There may be an adjacent small effusion.   Electronically Signed   By: Francene Boyers M.D.   On: 05/26/2015 12:08   Dg Chest Port 1 View  05/25/2015   CLINICAL DATA:  Short of breath. Severe abdominal pain and shortness of breath.  EXAM: PORTABLE CHEST - 1 VIEW  COMPARISON:  03/21/2015.  FINDINGS: Low volume chest. This accentuates the size of the cardiopericardial silhouette which is probably within normal limits. Bilateral basilar opacity probably represents atelectasis based on the low lung volumes. No gross focal consolidation. Monitoring leads project over the chest.  IMPRESSION: Low volume chest.   Electronically Signed   By: Andreas Newport M.D.   On: 05/25/2015 15:27    Scheduled Meds: . antiseptic oral rinse  7 mL Mouth Rinse BID  . piperacillin-tazobactam (ZOSYN)  IV  3.375 g Intravenous Q8H  . sodium chloride  3 mL Intravenous Q12H  . vancomycin  1,000 mg Intravenous Q24H   Continuous Infusions: . sodium chloride 150 mL/hr at 05/27/15 0701    Principal Problem:   Sepsis due to Gram negative bacteria Active Problems:   Hyponatremia   Hypokalemia   Dehydration   Macrocytic anemia   Cirrhosis   Acute encephalopathy   Thrombocytopenia   Elevated troponin   ETOH abuse   Sepsis   Compression  fracture    Time spent: 30 minutes    [facility] M  [facility] Pager [phone]. If 7PM-7AM, please contact night-coverage at [email or web address], password [facility] 05/27/2015, 12:42 PM  LOS: 2 days

## 2015-05-27 NOTE — Clinical Documentation Improvement (Signed)
Internal Medicine    Please provide clarification of diagnosis related to  following documentation  Likely "demand" related. Thank you     Demand Ischemia  Other  Clinically Undetermined  Supporting Information:  Noted lab values:  "elevated troponin 0.17     Please exercise your independent, professional judgment when responding. A specific answer is not anticipated or expected.   Thank You, Lavonda Jumbo [facility] Information Management [place] [phone]

## 2015-05-27 NOTE — Discharge Instructions (Signed)
Influenza Vaccine (Flu Vaccine, Inactivated or Recombinant) 2014-2015: What You Need to Know 1. Why get vaccinated? Influenza ("flu") is a contagious disease that spreads around the [place] every winter, usually between October and May. Flu is caused by influenza viruses, and is spread mainly by coughing, sneezing, and close contact. Anyone can get flu, but the risk of getting flu is highest among children. Symptoms come on suddenly and may last several days. They can include:  fever/chills  sore throat  muscle aches  fatigue  cough  headache  runny or stuffy nose Flu can make some people much sicker than others. These people include young children, people 65 and older, pregnant women, and people with certain health conditions-such as heart, lung or kidney disease, nervous system disorders, or a weakened immune system. Flu vaccination is especially important for these people, and anyone in close contact with them. Flu can also lead to pneumonia, and make existing medical conditions worse. It can cause diarrhea and seizures in children. Each year thousands of people in the [place] die from flu, and many more are hospitalized. Flu vaccine is the best protection against flu and its complications. Flu vaccine also helps prevent spreading flu from person to person. 2. Inactivated and recombinant flu vaccines You are getting an injectable flu vaccine, which is either an "inactivated" or "recombinant" vaccine. These vaccines do not contain any live influenza virus. They are given by injection with a needle, and often called the "flu shot."  A different live, attenuated (weakened) influenza vaccine is sprayed into the nostrils. This vaccine is described in a separate Vaccine Information Statement. Flu vaccination is recommended every year. Some children 6 months through 8 years of age might need two doses during one year. Flu viruses are always changing. Each year's flu vaccine is made  to protect against 3 or 4 viruses that are likely to cause disease that year. Flu vaccine cannot prevent all cases of flu, but it is the best defense against the disease.  It takes about 2 weeks for protection to develop after the vaccination, and protection lasts several months to a year. Some illnesses that are not caused by influenza virus are often mistaken for flu. Flu vaccine will not prevent these illnesses. It can only prevent influenza. Some inactivated flu vaccine contains a very small amount of a mercury-based preservative called thimerosal. Studies have shown that thimerosal in vaccines is not harmful, but flu vaccines that do not contain a preservative are available. 3. Some people should not get this vaccine Tell the person who gives you the vaccine:  If you have any severe, life-threatening allergies. If you ever had a life-threatening allergic reaction after a dose of flu vaccine, or have a severe allergy to any part of this vaccine, including (for example) an allergy to gelatin, antibiotics, or eggs, you may be advised not to get vaccinated. Most, but not all, types of flu vaccine contain a small amount of egg protein.  If you ever had Guillain-Barr Syndrome (a severe paralyzing illness, also called GBS). Some people with a history of GBS should not get this vaccine. This should be discussed with your doctor.  If you are not feeling well. It is usually okay to get flu vaccine when you have a mild illness, but you might be advised to wait until you feel better. You should come back when you are better. 4. Risks of a vaccine reaction With a vaccine, like any medicine, there is a chance of side   effects. These are usually mild and go away on their own. Problems that could happen after any vaccine:  Brief fainting spells can happen after any medical procedure, including vaccination. Sitting or lying down for about 15 minutes can help prevent fainting, and injuries caused by a fall. Tell  your doctor if you feel dizzy, or have vision changes or ringing in the ears.  Severe shoulder pain and reduced range of motion in the arm where a shot was given can happen, very rarely, after a vaccination.  Severe allergic reactions from a vaccine are very rare, estimated at less than 1 in a million doses. If one were to occur, it would usually be within a few minutes to a few hours after the vaccination. Mild problems following inactivated flu vaccine:  soreness, redness, or swelling where the shot was given  hoarseness  sore, red or itchy eyes  cough  fever  aches  headache  itching  fatigue If these problems occur, they usually begin soon after the shot and last 1 or 2 days. Moderate problems following inactivated flu vaccine:  Young children who get inactivated flu vaccine and pneumococcal vaccine (PCV13) at the same time may be at increased risk for seizures caused by fever. Ask your doctor for more information. Tell your doctor if a child who is getting flu vaccine has ever had a seizure. Inactivated flu vaccine does not contain live flu virus, so you cannot get the flu from this vaccine. As with any medicine, there is a very remote chance of a vaccine causing a serious injury or death. The safety of vaccines is always being monitored. For more information, visit: www.cdc.gov/vaccinesafety/ 5. What if there is a serious reaction? What should I look for?  Look for anything that concerns you, such as signs of a severe allergic reaction, very high fever, or behavior changes. Signs of a severe allergic reaction can include hives, swelling of the face and throat, difficulty breathing, a fast heartbeat, dizziness, and weakness. These would start a few minutes to a few hours after the vaccination. What should I do?  If you think it is a severe allergic reaction or other emergency that can't wait, call 9-1-1 and get the person to the nearest hospital. Otherwise, call your  doctor.  Afterward, the reaction should be reported to the Vaccine Adverse Event Reporting System (VAERS). Your doctor should file this report, or you can do it yourself through the VAERS web site at www.vaers.hhs.gov, or by calling 1-800-822-7967. VAERS does not give medical advice. 6. The National Vaccine Injury Compensation Program The National Vaccine Injury Compensation Program (VICP) is a federal program that was created to compensate people who may have been injured by certain vaccines. Persons who believe they may have been injured by a vaccine can learn about the program and about filing a claim by calling 1-800-338-2382 or visiting the VICP website at www.hrsa.gov/vaccinecompensation. There is a time limit to file a claim for compensation. 7. How can I learn more?  Ask your health care provider.  Call your local or state health department.  Contact the Centers for Disease Control and Prevention (CDC):  Call 1-800-232-4636 (1-800-CDC-INFO) or  Visit CDC's website at www.cdc.gov/flu CDC Vaccine Information Statement (Interim) Inactivated Influenza Vaccine (04/29/2013) Document Released: 06/22/2006 Document Revised: 01/12/2014 Document Reviewed: 08/15/2013 ExitCare Patient Information 2015 [employer]. This information is not intended to replace advice given to you by your health care provider. Make sure you discuss any questions you have with your health   care provider.  

## 2015-05-28 DIAGNOSIS — N390 Urinary tract infection, site not specified: Secondary | ICD-10-CM

## 2015-05-28 DIAGNOSIS — K7030 Alcoholic cirrhosis of liver without ascites: Secondary | ICD-10-CM

## 2015-05-28 DIAGNOSIS — D539 Nutritional anemia, unspecified: Secondary | ICD-10-CM

## 2015-05-28 DIAGNOSIS — F1010 Alcohol abuse, uncomplicated: Secondary | ICD-10-CM

## 2015-05-28 DIAGNOSIS — D696 Thrombocytopenia, unspecified: Secondary | ICD-10-CM

## 2015-05-28 DIAGNOSIS — G9340 Encephalopathy, unspecified: Secondary | ICD-10-CM

## 2015-05-28 DIAGNOSIS — D689 Coagulation defect, unspecified: Secondary | ICD-10-CM

## 2015-05-28 DIAGNOSIS — B9620 Unspecified Escherichia coli [E. coli] as the cause of diseases classified elsewhere: Secondary | ICD-10-CM

## 2015-05-28 DIAGNOSIS — A4150 Gram-negative sepsis, unspecified: Secondary | ICD-10-CM

## 2015-05-28 LAB — CULTURE, BLOOD (ROUTINE X 2)

## 2015-05-28 LAB — BASIC METABOLIC PANEL
Anion gap: 7 (ref 5–15)
BUN: 42 mg/dL — AB (ref 6–20)
CHLORIDE: 111 mmol/L (ref 101–111)
CO2: 21 mmol/L — ABNORMAL LOW (ref 22–32)
CREATININE: 0.76 mg/dL (ref 0.44–1.00)
Calcium: 7.1 mg/dL — ABNORMAL LOW (ref 8.9–10.3)
GFR calc Af Amer: >60 mL/min (ref >60)
Glucose, Bld: 127 mg/dL — ABNORMAL HIGH (ref 65–99)
Potassium: 3.1 mmol/L — ABNORMAL LOW (ref 3.5–5.1)
SODIUM: 139 mmol/L (ref 135–145)

## 2015-05-28 LAB — CBC
HCT: 25 % — ABNORMAL LOW (ref 36.0–46.0)
Hemoglobin: 8.5 g/dL — ABNORMAL LOW (ref 12.0–15.0)
MCH: 44 pg — ABNORMAL HIGH (ref 26.0–34.0)
MCHC: 34 g/dL (ref 30.0–36.0)
MCV: 129.5 fL — AB (ref 78.0–100.0)
PLATELETS: 33 10*3/uL — AB (ref 150–400)
RBC: 1.93 MIL/uL — ABNORMAL LOW (ref 3.87–5.11)
RDW: 16.6 % — AB (ref 11.5–15.5)
WBC: 6.8 10*3/uL (ref 4.0–10.5)

## 2015-05-28 MED ORDER — FOLIC ACID 5 MG/ML IJ SOLN
1.0000 mg | Freq: Every day | INTRAMUSCULAR | Status: DC
  Administered 2015-05-28 – 2015-05-30 (×3): 1 mg via INTRAVENOUS
  Filled 2015-05-28 (×6): qty 0.2

## 2015-05-28 MED ORDER — VANCOMYCIN HCL IN DEXTROSE 750-5 MG/150ML-% IV SOLN
750.0000 mg | Freq: Two times a day (BID) | INTRAVENOUS | Status: DC
  Administered 2015-05-28: 750 mg via INTRAVENOUS
  Filled 2015-05-28 (×7): qty 150

## 2015-05-28 MED ORDER — THIAMINE HCL 100 MG/ML IJ SOLN
100.0000 mg | Freq: Every day | INTRAMUSCULAR | Status: DC
Start: 2015-05-28 — End: 2015-06-01
  Administered 2015-05-28 – 2015-05-31 (×4): 100 mg via INTRAVENOUS
  Filled 2015-05-28 (×5): qty 2

## 2015-05-28 MED ORDER — POTASSIUM CHLORIDE IN NACL 40-0.9 MEQ/L-% IV SOLN
INTRAVENOUS | Status: DC
  Administered 2015-05-28 – 2015-05-29 (×2): 70 mL/h via INTRAVENOUS
  Administered 2015-05-30 – 2015-06-02 (×3): 50 mL/h via INTRAVENOUS

## 2015-05-28 MED ORDER — POTASSIUM CHLORIDE 10 MEQ/100ML IV SOLN
10.0000 meq | INTRAVENOUS | Status: AC
  Administered 2015-05-28 (×2): 10 meq via INTRAVENOUS
  Filled 2015-05-28: qty 100

## 2015-05-28 MED ORDER — POTASSIUM CHLORIDE CRYS ER 20 MEQ PO TBCR
40.0000 meq | EXTENDED_RELEASE_TABLET | ORAL | Status: DC
Start: 2015-05-28 — End: 2015-05-28
  Administered 2015-05-28: 40 meq via ORAL
  Filled 2015-05-28 (×2): qty 2

## 2015-05-28 MED ORDER — OXYCODONE HCL 5 MG PO TABS
5.0000 mg | ORAL_TABLET | Freq: Four times a day (QID) | ORAL | Status: DC | PRN
  Administered 2015-05-31: 5 mg via ORAL
  Filled 2015-05-28: qty 1

## 2015-05-28 NOTE — Clinical Social Work Note (Signed)
CSW attempted to assess and educate patient, however patient remained confused. Patient told CSW and nurse to go outside and play.  She also said that she was in a place where she use to go to church.    Annice Needy, [place] [phone]

## 2015-05-28 NOTE — Care Management Note (Signed)
Case Management Note  Patient Details  Name: [name] MRN: [id number] Date of Birth: 1955-12-16  Subjective/Objective:                    Action/Plan:   Expected Discharge Date:                  Expected Discharge Plan:     In-House Referral:     Discharge planning Services     Post Acute Care Choice:    Choice offered to:     DME Arranged:    DME Agency:     HH Arranged:    HH Agency:     Status of Service:     Medicare Important Message Given:    Date Medicare IM Given:    Medicare IM give by:    Date Additional Medicare IM Given:    Additional Medicare Important Message give by:     If discussed at Long Length of Stay Meetings, dates discussed:    Additional Comments: Anticipate pt will remain in hospital over the weekend. Medical staff still testing and working up encephalopathy. Arlyss Queen Paragon, RN 05/28/2015, 11:03 AM

## 2015-05-28 NOTE — Progress Notes (Signed)
[facility] PROGRESS NOTE  [name] VWU:[id number] DOB: [birth year] DOA: 05/25/2015 PCP: Dionisio Paschal, NP  Assessment/Plan: Acute Encephalopathy: likely multifactorial - alcohol withdrawal, infection, severe dehydration. More alert but uncooperative and remains confused. CIWA score 0. Lactic acid normalized. Vit B12 and folate within normal limits. Will check RPR. Ammonia level only mildly elevated on admission. Will recheck.  She remains afebrile and stable. No vomiting or diarrhea since admission.CT abdomen pelvis without contrast (AKI) shows new compression fx and possible pna. 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  Blood cultures with gram negative rod. Continue the Vanc Zosyn day #4 pending speciation and sensitivities. Will decrease rate of IV fluids. Will mobilize. If no improvement  sepsis due to gram negative bacteremia: per blood culture. Likely related to UTI.  Continue anti-biotics as above. See #1.   UTI: culture with E Coli. Antibiotics as above. Await sensitivities  Cirrhosis:  ETOH abuse. Chart review indicates hepatitis panel from 03/2015 nml. On admission MELD score 34 (56% mortality in 3 mo) - slightly scewed due to dehydration/AKI. AST trending up ALT/ALK Phos within limits of normal, total bilie stable 5.7 up from 2 months ago. If no improvement will consider inpatient consult otherwise will need outpt f/u with GI.    AKI: Resolved.  Baseline 0.9. Urine output good. Hold nephrotoxins and monitor. BMET in am  Thrombocytopenia: likely hepatic etiology given ETOH abuse hx. Stable. No s/sx bleeding. monitor  Hyponatremia/Hypokalemia: hyponatremia resolved. Hypokalemia mild. Will replete and recheck. Will check magnesium as well.    Elevated Troponin: 0.17 on admission and stable x3. Initial EKG w/o ACS. Repeat EKG unchanged. Likely related to demand ischemia in setting of #2. Echo reveals wall thickness wasincreased in a pattern of mild  LVH. Systolic function wasvigorous. The estimated ejection fraction was in the range of 70%to 75%. Wall motion was normal; there were no regional wallmotion abnormalities. Doppler parameters are consistent withabnormal left ventricular relaxation (grade 1 diastolicdysfunction). No chest pain.   Macrocytic Anemia: Hgb stable at  8.0 likely influenced by vigorous IV fluids. Baseilne 12. Likely related to malnutrition and Cirrhosis. Folate and B12 within limits of normal.   ETOH abuse: chronic condition for pt. Last ETOH on 05/21/15 per pt. ETOH level nml. CIWA score 0 this am. See social work note. She would benefit detox  Hypercalcemia: chronic and trending down per chart review. Likely related to above. Will monitor  Code Status: full Family Communication:  Disposition Plan: home when ready   Consultants:  none  Procedures:  none  Antibiotics:  Vancomycin 05/25/15>>  Zosyn 05/25/15>>>  HPI/Subjective: More alert but clearly slow to respond   Objective: Filed Vitals:   05/28/15 0605  BP: 146/68  Pulse: 99  Temp: 97.6 F (36.4 C)  Resp: 18    Intake/Output Summary (Last 24 hours) at 05/28/15 0947 Last data filed at 05/28/15 0606  Gross per 24 hour  Intake      0 ml  Output   1201 ml  Net  -1201 ml   Filed Weights   05/25/15 2101 05/26/15 0500 05/27/15 0500  Weight: 62.8 kg (138 lb 7.2 oz) 65.5 kg (144 lb 6.4 oz) 67.5 kg (148 lb 13 oz)    Exam:   General:  Well nourished chronically ill appearing   Cardiovascular: RRR no MGR no Le edema  Respiratory: normal effort slightly shallow BS distant but clear  Abdomen: soft +BS non-tender to palpation  Musculoskeletal: joints without swelling/erythema  Neuro: lethargic  but improving slowly, speech slow but clear, movement slow, oriented to self and place only. Able to follow commands  Data Reviewed: Basic Metabolic Panel:  Recent Labs Lab 05/25/15 1519 05/25/15 2201 05/26/15 0430 05/27/15 0421  05/28/15 0737  NA 129*  --  135 140 139  K 3.0*  --  3.6 3.2* 3.1*  CL 91*  --  104 109 111  CO2 23  --  21* 22 21*  GLUCOSE 78  --  74 109* 127*  BUN 24*  --  30* 49* 42*  CREATININE 1.88*  --  1.38* 1.14* 0.76  CALCIUM 7.4*  --  6.6* 7.1* 7.1*  MG  --  1.6*  --   --   --    Liver Function Tests:  Recent Labs Lab 05/25/15 1519 05/26/15 0430 05/27/15 0421  AST 168* 163* 193*  ALT 24 23 34  ALKPHOS 72 58 53  BILITOT 5.6* 5.7* 5.7*  PROT 6.9 6.1* 5.5*  ALBUMIN 2.2* 1.9* 1.6*    Recent Labs Lab 05/25/15 1519  LIPASE 75*    Recent Labs Lab 05/25/15 1530  AMMONIA 38*   CBC:  Recent Labs Lab 05/25/15 1612 05/26/15 0430 05/27/15 0421 05/28/15 0737  WBC 7.2 6.1 4.4 6.8  NEUTROABS 6.2  --  3.1  --   HGB 9.3* 8.5* 8.0* 8.5*  HCT 27.8* 25.5* 24.4* 25.0*  MCV 133.7* 134.9* 131.9* 129.5*  PLT 44* 35* 36* 33*   Cardiac Enzymes:  Recent Labs Lab 05/25/15 1519 05/25/15 1723 05/25/15 2201 05/26/15 0430  TROPONINI 0.17* 0.12* 0.16* 0.15*   BNP (last 3 results)  Recent Labs  03/21/15 2209  BNP 53.0    ProBNP (last 3 results) No results for input(s): PROBNP in the last 8760 hours.  CBG: No results for input(s): GLUCAP in the last 168 hours.  Recent Results (from the past 240 hour(s))  Urine culture     Status: None (Preliminary result)   Collection Time: 05/25/15  3:12 PM  Result Value Ref Range Status   Specimen Description URINE, CATHETERIZED  Final   Special Requests NONE  Final   Culture   Final    80,000 COLONIES/ml ESCHERICHIA COLI Performed at [facility]    Report Status PENDING  Incomplete  Blood Culture (routine x 2)     Status: None   Collection Time: 05/25/15  3:30 PM  Result Value Ref Range Status   Specimen Description BLOOD LEFT ARM  Final   Special Requests BOTTLES DRAWN AEROBIC AND ANAEROBIC 8CC  Final   Culture  Setup Time   Final    GRAM NEGATIVE RODS RECOVERED FROM BOTH BOTTLES Gram Stain Report Called to,Read Back  By and Verified With: HEARN,J. AT 0615 ON 05/26/2015 BY RESSEGGER,R. Performed at [facility]    Culture   Final    ESCHERICHIA COLI Performed at [facility]    Report Status 05/28/2015 FINAL  Final   Organism ID, Bacteria ESCHERICHIA COLI  Final      Susceptibility   Escherichia coli - MIC*    AMPICILLIN 4 SENSITIVE Sensitive     CEFAZOLIN <=4 SENSITIVE Sensitive     CEFEPIME <=1 SENSITIVE Sensitive     CEFTAZIDIME <=1 SENSITIVE Sensitive     CEFTRIAXONE <=1 SENSITIVE Sensitive     CIPROFLOXACIN <=0.25 SENSITIVE Sensitive     GENTAMICIN <=1 SENSITIVE Sensitive     IMIPENEM <=0.25 SENSITIVE Sensitive     TRIMETH/SULFA <=20 SENSITIVE Sensitive  AMPICILLIN/SULBACTAM <=2 SENSITIVE Sensitive     PIP/TAZO <=4 SENSITIVE Sensitive     * ESCHERICHIA COLI  Blood Culture (routine x 2)     Status: None   Collection Time: 05/25/15  3:38 PM  Result Value Ref Range Status   Specimen Description BLOOD RIGHT HAND  Final   Special Requests BOTTLES DRAWN AEROBIC AND ANAEROBIC 5CC  Final   Culture  Setup Time   Final    GRAM NEGATIVE RODS RECOVERED FROM BOTH BOTTLES Gram Stain Report Called to,Read Back By and Verified With: PHILLIPS,C. AT 0800 ON 05/26/2015 BY RESSEGGER,R. Performed at [facility]    Culture   Final    ESCHERICHIA COLI SUSCEPTIBILITIES PERFORMED ON PREVIOUS CULTURE WITHIN THE LAST 5 DAYS. Performed at [facility]    Report Status 05/28/2015 FINAL  Final  MRSA PCR Screening     Status: None   Collection Time: 05/25/15  8:40 PM  Result Value Ref Range Status   MRSA by PCR NEGATIVE NEGATIVE Final    Comment:        The GeneXpert MRSA Assay (FDA approved for NASAL specimens only), is one component of a comprehensive MRSA colonization surveillance program. It is not intended to diagnose MRSA infection nor to guide or monitor treatment for MRSA infections.      Studies: Dg Chest Port 1 View  05/26/2015   CLINICAL DATA:  Shortness  of breath.  Pneumonia.  Fever.  EXAM: PORTABLE CHEST - 1 VIEW  COMPARISON:  05/25/2015 and 03/21/2015  FINDINGS: The patient has developed dense consolidation at the left lung base. There may be an adjacent small effusion as well. Right lung is clear. Heart size and vascularity remain normal. No acute osseous abnormality.  IMPRESSION: Interval consolidation of the left lower lobe consistent with pneumonia. There may be an adjacent small effusion.   Electronically Signed   By: Lorriane Shire M.D.   On: 05/26/2015 12:08    Scheduled Meds: . antiseptic oral rinse  7 mL Mouth Rinse BID  . feeding supplement (ENSURE ENLIVE)  237 mL Oral BID BM  . piperacillin-tazobactam (ZOSYN)  IV  3.375 g Intravenous Q8H  . sodium chloride  3 mL Intravenous Q12H  . vancomycin  750 mg Intravenous Q12H   Continuous Infusions: . sodium chloride 150 mL/hr at 05/27/15 0701    Principal Problem:   Sepsis due to Gram negative bacteria Active Problems:   Hyponatremia   Hypokalemia   Dehydration   Macrocytic anemia   Cirrhosis   Acute encephalopathy   Thrombocytopenia   Elevated troponin   ETOH abuse   Sepsis   Compression fracture   Bacteremia    Time spent: 40 minutes    [place] [facility] Pager [phone]. If 7PM-7AM, please contact night-coverage at [email or web address], password [facility] 05/28/2015, 9:47 AM  LOS: 3 days

## 2015-05-28 NOTE — Progress Notes (Signed)
Patient refuses blood work. Toya Smothers, NP notified.

## 2015-05-28 NOTE — Progress Notes (Signed)
Patient refuses potassium pills. Dr. Sherrie Mustache notified.

## 2015-05-29 ENCOUNTER — Inpatient Hospital Stay (HOSPITAL_COMMUNITY): Payer: Medicaid Other

## 2015-05-29 DIAGNOSIS — J690 Pneumonitis due to inhalation of food and vomit: Secondary | ICD-10-CM

## 2015-05-29 LAB — URINE CULTURE: Culture: 80000

## 2015-05-29 MED ORDER — FUROSEMIDE 10 MG/ML IJ SOLN
10.0000 mg | Freq: Once | INTRAMUSCULAR | Status: AC
  Administered 2015-05-29: 10 mg via INTRAVENOUS
  Filled 2015-05-29: qty 2

## 2015-05-29 MED ORDER — VITAMIN K1 10 MG/ML IJ SOLN
5.0000 mg | Freq: Every day | INTRAMUSCULAR | Status: AC
  Administered 2015-05-30: 5 mg via SUBCUTANEOUS
  Filled 2015-05-29 (×2): qty 1

## 2015-05-29 MED ORDER — METHYLPREDNISOLONE SODIUM SUCC 125 MG IJ SOLR: 60.0000 mg | Freq: Two times a day (BID) | INTRAMUSCULAR | Status: DC

## 2015-05-29 MED ORDER — CIPROFLOXACIN IN D5W 400 MG/200ML IV SOLN
400.0000 mg | Freq: Two times a day (BID) | INTRAVENOUS | Status: DC
  Administered 2015-05-29: 400 mg via INTRAVENOUS
  Filled 2015-05-29: qty 200

## 2015-05-29 MED ORDER — PANTOPRAZOLE SODIUM 40 MG IV SOLR
40.0000 mg | INTRAVENOUS | Status: DC
  Administered 2015-05-29 – 2015-05-31 (×3): 40 mg via INTRAVENOUS
  Filled 2015-05-29 (×3): qty 40

## 2015-05-29 MED ORDER — METHYLPREDNISOLONE SODIUM SUCC 40 MG IJ SOLR
40.0000 mg | Freq: Two times a day (BID) | INTRAMUSCULAR | Status: DC
  Administered 2015-05-30 – 2015-05-31 (×4): 40 mg via INTRAVENOUS
  Filled 2015-05-29 (×4): qty 1

## 2015-05-29 MED ORDER — PIPERACILLIN-TAZOBACTAM 3.375 G IVPB
3.3750 g | Freq: Three times a day (TID) | INTRAVENOUS | Status: DC
  Administered 2015-05-29 – 2015-06-01 (×9): 3.375 g via INTRAVENOUS
  Filled 2015-05-29 (×12): qty 50

## 2015-05-29 MED ORDER — VANCOMYCIN HCL 10 G IV SOLR
1250.0000 mg | Freq: Once | INTRAVENOUS | Status: DC
  Filled 2015-05-29: qty 1250

## 2015-05-29 MED ORDER — POTASSIUM CHLORIDE 10 MEQ/100ML IV SOLN
10.0000 meq | INTRAVENOUS | Status: AC
  Administered 2015-05-29 (×2): 10 meq via INTRAVENOUS
  Filled 2015-05-29: qty 100

## 2015-05-29 MED ORDER — MAGNESIUM SULFATE 50 % IJ SOLN
1.0000 g | Freq: Once | INTRAVENOUS | Status: AC
  Administered 2015-05-29: 1 g via INTRAVENOUS
  Filled 2015-05-29: qty 2

## 2015-05-29 MED ORDER — VANCOMYCIN HCL IN DEXTROSE 750-5 MG/150ML-% IV SOLN
750.0000 mg | Freq: Two times a day (BID) | INTRAVENOUS | Status: DC
  Filled 2015-05-29 (×3): qty 150

## 2015-05-29 MED ORDER — LEVALBUTEROL HCL 0.63 MG/3ML IN NEBU
0.6300 mg | INHALATION_SOLUTION | RESPIRATORY_TRACT | Status: DC
  Administered 2015-05-29 – 2015-05-30 (×5): 0.63 mg via RESPIRATORY_TRACT
  Filled 2015-05-29 (×6): qty 3

## 2015-05-29 MED ORDER — LEVALBUTEROL HCL 0.63 MG/3ML IN NEBU
0.6300 mg | INHALATION_SOLUTION | RESPIRATORY_TRACT | Status: DC
  Administered 2015-05-29: 0.63 mg via RESPIRATORY_TRACT
  Filled 2015-05-29: qty 3

## 2015-05-29 MED ORDER — VITAMIN K1 10 MG/ML IJ SOLN
5.0000 mg | Freq: Once | INTRAMUSCULAR | Status: AC
  Administered 2015-05-29: 5 mg via SUBCUTANEOUS
  Filled 2015-05-29: qty 1

## 2015-05-29 NOTE — Progress Notes (Addendum)
[facility] PROGRESS NOTE  [name] ZOX:[id number] DOB: Mar 16, 1956 DOA: 05/25/2015 PCP: Vertis Kelch, NP    Code Status: Full code Family Communication: Discussion with family pending Disposition Plan: Discharge when clinically appropriate   Consultants:  None  Procedures: 2-D echocardiogram 05/26/15:Study Conclusions - Left ventricle: The cavity size was normal. Wall thickness was increased in a pattern of mild LVH. Systolic function was vigorous. The estimated ejection fraction was in the range of 70% to 75%. Wall motion was normal; there were no regional wall motion abnormalities. Doppler parameters are consistent with abnormal left ventricular relaxation (grade 1 diastolic dysfunction). - Mitral valve: Calcified annulus. There was trivial regurgitation. - Right atrium: Central venous pressure (est): 3 mm Hg. - Tricuspid valve: There was trivial regurgitation. - Pulmonary arteries: Systolic pressure could not be accurately estimated. - Pericardium, extracardiac: A probable, small pericardial effusion was identified anterior to the heart. There was a left pleural effusion. Impressions: - Mild LVH with LVEF 70-75%, no wall motion abnormalities, and grade 1 diastolic dysfunction. Trivial mitral and tricuspid regurgitation. Probable small anterior pericardial effusion as well as potential small left pleural effusion.  Antibiotics:  Zosyn 05/25/15>>  Vancomycin 05/25/15> 05/28/15  HPI/Subjective: Nurse reports that the patient refuses lab draws. She refuses to take her oral medications. Upon my entrance into the room, she was sleeping, but arousable, but wanted to be left alone. She is confused and continues to push me away as I try to examine her.  Objective: Filed Vitals:   05/29/15 1234  BP: 148/73  Pulse: 114  Temp: 97.6 F (36.4 C)  Resp: 20   oxygen saturation 97%  Intake/Output Summary (Last 24 hours) at 05/29/15  1407 Last data filed at 05/29/15 8119  Gross per 24 hour  Intake      0 ml  Output    900 ml  Net   -900 ml   Filed Weights   05/25/15 2101 05/26/15 0500 05/27/15 0500  Weight: 62.8 kg (138 lb 7.2 oz) 65.5 kg (144 lb 6.4 oz) 67.5 kg (148 lb 13 oz)    Exam:   General:  Disheveled appearing 59 year old Caucasian woman who appears older than her stated age.  Cardiovascular: S1, S2, with mild tachycardia.  Respiratory: Bilateral rhonchus wheezes; breathing minimally labored.  Abdomen: Mildly obese, positive bowel sounds, soft, nontender, nondistended.  Musculoskeletal/extremities: No pedal edema.  Neurologic: The patient was initially asleep, but she became alert and oriented to herself. She believes that she is at church. She is not oriented to her brother or aunt. She is not cooperative with exam.   Data Reviewed: Basic Metabolic Panel:  Recent Labs Lab 05/25/15 1519 05/25/15 2201 05/26/15 0430 05/27/15 0421 05/28/15 0737  NA 129*  --  135 140 139  K 3.0*  --  3.6 3.2* 3.1*  CL 91*  --  104 109 111  CO2 23  --  21* 22 21*  GLUCOSE 78  --  74 109* 127*  BUN 24*  --  30* 49* 42*  CREATININE 1.88*  --  1.38* 1.14* 0.76  CALCIUM 7.4*  --  6.6* 7.1* 7.1*  MG  --  1.6*  --   --   --    Liver Function Tests:  Recent Labs Lab 05/25/15 1519 05/26/15 0430 05/27/15 0421  AST 168* 163* 193*  ALT 24 23 34  ALKPHOS 72 58 53  BILITOT 5.6* 5.7* 5.7*  PROT 6.9 6.1* 5.5*  ALBUMIN 2.2* 1.9* 1.6*    Recent  Labs Lab 05/25/15 1519  LIPASE 75*    Recent Labs Lab 05/25/15 1530  AMMONIA 38*   CBC:  Recent Labs Lab 05/25/15 1612 05/26/15 0430 05/27/15 0421 05/27/15 1020 05/28/15 0737  WBC 7.2 6.1 4.4  --  6.8  NEUTROABS 6.2  --  3.1  --   --   HGB 9.3* 8.5* 8.0*  --  8.5*  HCT 27.8* 25.5* 24.4* 23.8* 25.0*  MCV 133.7* 134.9* 131.9*  --  129.5*  PLT 44* 35* 36*  --  33*   Cardiac Enzymes:  Recent Labs Lab 05/25/15 1519 05/25/15 1723 05/25/15 2201  05/26/15 0430  TROPONINI 0.17* 0.12* 0.16* 0.15*   BNP (last 3 results)  Recent Labs  03/21/15 2209  BNP 53.0    ProBNP (last 3 results) No results for input(s): PROBNP in the last 8760 hours.  CBG: No results for input(s): GLUCAP in the last 168 hours.  Recent Results (from the past 240 hour(s))  Urine culture     Status: None   Collection Time: 05/25/15  3:12 PM  Result Value Ref Range Status   Specimen Description URINE, CATHETERIZED  Final   Special Requests NONE  Final   Culture   Final    80,000 COLONIES/ml ESCHERICHIA COLI Performed at [facility]    Report Status 05/29/2015 FINAL  Final   Organism ID, Bacteria ESCHERICHIA COLI  Final      Susceptibility   Escherichia coli - MIC*    AMPICILLIN <=2 SENSITIVE Sensitive     CEFAZOLIN <=4 SENSITIVE Sensitive     CEFTRIAXONE <=1 SENSITIVE Sensitive     CIPROFLOXACIN <=0.25 SENSITIVE Sensitive     GENTAMICIN <=1 SENSITIVE Sensitive     IMIPENEM <=0.25 SENSITIVE Sensitive     NITROFURANTOIN <=16 SENSITIVE Sensitive     TRIMETH/SULFA <=20 SENSITIVE Sensitive     AMPICILLIN/SULBACTAM <=2 SENSITIVE Sensitive     PIP/TAZO <=4 SENSITIVE Sensitive     * 80,000 COLONIES/ml ESCHERICHIA COLI  Blood Culture (routine x 2)     Status: None   Collection Time: 05/25/15  3:30 PM  Result Value Ref Range Status   Specimen Description BLOOD LEFT ARM  Final   Special Requests BOTTLES DRAWN AEROBIC AND ANAEROBIC 8CC  Final   Culture  Setup Time   Final    GRAM NEGATIVE RODS RECOVERED FROM BOTH BOTTLES Gram Stain Report Called to,Read Back By and Verified With: HEARN,J. AT 0615 ON 05/26/2015 BY RESSEGGER,R. Performed at [facility]    Culture   Final    ESCHERICHIA COLI Performed at [facility]    Report Status 05/28/2015 FINAL  Final   Organism ID, Bacteria ESCHERICHIA COLI  Final      Susceptibility   Escherichia coli - MIC*    AMPICILLIN 4 SENSITIVE Sensitive     CEFAZOLIN <=4 SENSITIVE Sensitive      CEFEPIME <=1 SENSITIVE Sensitive     CEFTAZIDIME <=1 SENSITIVE Sensitive     CEFTRIAXONE <=1 SENSITIVE Sensitive     CIPROFLOXACIN <=0.25 SENSITIVE Sensitive     GENTAMICIN <=1 SENSITIVE Sensitive     IMIPENEM <=0.25 SENSITIVE Sensitive     TRIMETH/SULFA <=20 SENSITIVE Sensitive     AMPICILLIN/SULBACTAM <=2 SENSITIVE Sensitive     PIP/TAZO <=4 SENSITIVE Sensitive     * ESCHERICHIA COLI  Blood Culture (routine x 2)     Status: None   Collection Time: 05/25/15  3:38 PM  Result Value Ref Range Status   Specimen Description  BLOOD RIGHT HAND  Final   Special Requests BOTTLES DRAWN AEROBIC AND ANAEROBIC 5CC  Final   Culture  Setup Time   Final    GRAM NEGATIVE RODS RECOVERED FROM BOTH BOTTLES Gram Stain Report Called to,Read Back By and Verified With: PHILLIPS,C. AT 0800 ON 05/26/2015 BY RESSEGGER,R. Performed at [facility]    Culture   Final    ESCHERICHIA COLI SUSCEPTIBILITIES PERFORMED ON PREVIOUS CULTURE WITHIN THE LAST 5 DAYS. Performed at [facility]    Report Status 05/28/2015 FINAL  Final  MRSA PCR Screening     Status: None   Collection Time: 05/25/15  8:40 PM  Result Value Ref Range Status   MRSA by PCR NEGATIVE NEGATIVE Final    Comment:        The GeneXpert MRSA Assay (FDA approved for NASAL specimens only), is one component of a comprehensive MRSA colonization surveillance program. It is not intended to diagnose MRSA infection nor to guide or monitor treatment for MRSA infections.      Studies: Dg Chest Port 1 View  05/29/2015   CLINICAL DATA:  Rhonchi, alcohol abuse  EXAM: PORTABLE CHEST - 1 VIEW  COMPARISON:  05/26/2015  FINDINGS: Bilateral lower lobe airspace disease, left worse than right. Patchy areas of airspace disease in the upper lobes bilaterally. No pneumothorax. Small left pleural effusion. Stable cardiomediastinal silhouette. No acute osseous abnormality.  IMPRESSION: Bilateral areas of airspace disease most concerning for  multilobar pneumonia.   Electronically Signed   By: Elige Ko   On: 05/29/2015 13:21    Scheduled Meds: . antiseptic oral rinse  7 mL Mouth Rinse BID  . ciprofloxacin  400 mg Intravenous Q12H  . feeding supplement (ENSURE ENLIVE)  237 mL Oral BID BM  . folic acid  1 mg Intravenous Daily  . levalbuterol  0.63 mg Nebulization Q4H  . sodium chloride  3 mL Intravenous Q12H  . thiamine IV  100 mg Intravenous Daily   Continuous Infusions: . 0.9 % NaCl with KCl 40 mEq / L 50 mL/hr (05/29/15 1232)   Assessment and plan:  Principal Problem:   Acute encephalopathy Active Problems:   Alcoholic cirrhosis   Elevated troponin   Sepsis due to Gram negative bacteria   E-coli UTI   Aspiration pneumonia   ETOH abuse   Hyponatremia   Hypokalemia   Dehydration   Macrocytic anemia   Thrombocytopenia   Compression fracture of L1 lumbar vertebra   Coagulopathy    1. Acute encephalopathy. Etiology likely multifactorial including alcohol withdrawal hepatic encephalopathy, infections, and dehydration. 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 Her vitamin B12 and folate levels were within normal limits. -Patient refuses follow-up laboratory studies. -We'll attempt to order follow-up ammonia level and TSH.  Sepsis secondary to Escherichia coli urinary tract infection with Escherichia coli bacteremia. The patient was started on vancomycin and Zosyn empirically. Urine culture is pansensitive. Vancomycin was discontinued on 9/16. Consideration was made to change antibiotic therapy Cipro, but will continue Zosyn for treatment of possible aspiration pneumonia. -Patient is afebrile and her white blood cell count is within normal limits.  Probable aspiration pneumonia. Patient's chest x-ray on admission revealed bibasilar atelectasis. Follow-up chest x-ray on 9/17 reveal bilateral airspace disease  concerning for multilobar pneumonia. Given that the patient is an alcoholic, would favor aspiration pneumonia that fluffed out following hydration. -We'll continue Zosyn. Will add IV Solu-Medrol and  Xopenex nebulizer. -We'll try Lasix 10 mg IV 1.  Elevated troponin I. Patient's troponin I was elevated on admission and range from 0.12-0.16. 2-D echocardiogram ordered and revealed EF of 70-75% and no regional wall motion abnormalities. Elevated troponin I was thought to be secondary to demand ischemia.  Alcohol abuse with cirrhosis/associated macrocytosis, thrombocytopenia, coagulopathy. Patient has a long history of alcohol abuse. Per report from family, they have tried multiple times to get her to stop drinking. MELD score calculated on admission was 34 ( 56% mortality in 3 months). Patient was started on the CIWA protocol with diazepam. IV thiamine and folate were added subsequently. -We will attempt to contact the patient's family for a discussion about CODE STATUS and ongoing management.  Macrocytic anemia and thrombocytopenia; coagulopathy Patient's anemia panel revealed a total iron of 102, ferritin of 1363, folate of 13.5/RBC folate of 730, and vitamin B12 of 4 18-606. -Findings likely secondary to alcoholic liver disease. We'll continue to monitor. -If there is any evidence of bleeding, would transfuse packed red blood cells and platelets. -Patient has been refusing blood draws and her family will be asked to help to intervene. -Will give 5 mg of subcutaneous vitamin K. -Nursing reports very dark brown, possibly black stools. Will add Protonix empirically.  Hypokalemia and hypomagnesemia. Patient has been refusing oral potassium, so she was given potassium runs and potassium was added to the maintenance IV fluids.  -We'll order additional IV potassium runs and will give her 1 g of magnesium sulfate.   Hyponatremia.  Patient's serum sodium was 129 on admission. It has improved with  IV fluids. Will continue gentle IV fluids.  Acute kidney injury. The patient's creatinine was 1.88 on admission. With IV fluid hydration, it has normalized.  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 -Per nursing, the patient is eating and/or drinking very little to none. -We will discuss CODE STATUS with the patient's family. -We'll consider palliative care consult if they agree. Patient's overall prognosis is poor.        Time spent: 35 minutes    [facility]  [facility] Pager [phone]. If 7PM-7AM, please contact night-coverage at [email or web address], password [facility] 05/29/2015, 2:07 PM  LOS: 4 days

## 2015-05-29 NOTE — Progress Notes (Signed)
No wheezes noted Bilateral or Rhonchi , patient has been refusing oral medications especially potassium. Xopenex order is Q 4 hours. Xopenex like albuterol will decrease patients potassium which is a concern. Patients potassium is 3.1 as of last lab check. X-ray shows left atelectasis or consolidate ? Will continue to monitor and treat as able or allowed by patient.

## 2015-05-29 NOTE — Progress Notes (Addendum)
ANTIBIOTIC CONSULT NOTE   Pharmacy Consult for  zosyn Indication:  E coli bacteremia and UTI  Allergies  Allergen Reactions  . Loratadine Rash   Patient Measurements: Height:  (167.6 cm) Weight: 148 lb 13 oz (67.5 kg) IBW/kg (Calculated) : 59.3  Vital Signs:   Intake/Output from previous day: 09/16 0701 - 09/17 0700 In: 200 [P.O.:200] Out: 900 [Urine:900] Intake/Output from this shift:    Labs:  Recent Labs  05/27/15 0421 05/28/15 0737  WBC 4.4 6.8  HGB 8.0* 8.5*  PLT 36* 33*  CREATININE 1.14* 0.76   Estimated Creatinine Clearance: 72.6 mL/min (by C-G formula based on Cr of 0.76). No results for input(s): VANCOTROUGH, VANCOPEAK, VANCORANDOM, GENTTROUGH, GENTPEAK, GENTRANDOM, TOBRATROUGH, TOBRAPEAK, TOBRARND, AMIKACINPEAK, AMIKACINTROU, AMIKACIN in the last 72 hours.   Microbiology: Recent Results (from the past 720 hour(s))  Urine culture     Status: None (Preliminary result)   Collection Time: 05/25/15  3:12 PM  Result Value Ref Range Status   Specimen Description URINE, CATHETERIZED  Final   Special Requests NONE  Final   Culture   Final    80,000 COLONIES/ml ESCHERICHIA COLI REPEATING SENSITIVITIES Performed at [facility]    Report Status PENDING  Incomplete  Blood Culture (routine x 2)     Status: None   Collection Time: 05/25/15  3:30 PM  Result Value Ref Range Status   Specimen Description BLOOD LEFT ARM  Final   Special Requests BOTTLES DRAWN AEROBIC AND ANAEROBIC 8CC  Final   Culture  Setup Time   Final    GRAM NEGATIVE RODS RECOVERED FROM BOTH BOTTLES Gram Stain Report Called to,Read Back By and Verified With: HEARN,J. AT 0615 ON 05/26/2015 BY RESSEGGER,R. Performed at [facility]    Culture   Final    ESCHERICHIA COLI Performed at [facility]    Report Status 05/28/2015 FINAL  Final   Organism ID, Bacteria ESCHERICHIA COLI  Final      Susceptibility   Escherichia coli - MIC*    AMPICILLIN 4 SENSITIVE Sensitive      CEFAZOLIN <=4 SENSITIVE Sensitive     CEFEPIME <=1 SENSITIVE Sensitive     CEFTAZIDIME <=1 SENSITIVE Sensitive     CEFTRIAXONE <=1 SENSITIVE Sensitive     CIPROFLOXACIN <=0.25 SENSITIVE Sensitive     GENTAMICIN <=1 SENSITIVE Sensitive     IMIPENEM <=0.25 SENSITIVE Sensitive     TRIMETH/SULFA <=20 SENSITIVE Sensitive     AMPICILLIN/SULBACTAM <=2 SENSITIVE Sensitive     PIP/TAZO <=4 SENSITIVE Sensitive     * ESCHERICHIA COLI  Blood Culture (routine x 2)     Status: None   Collection Time: 05/25/15  3:38 PM  Result Value Ref Range Status   Specimen Description BLOOD RIGHT HAND  Final   Special Requests BOTTLES DRAWN AEROBIC AND ANAEROBIC 5CC  Final   Culture  Setup Time   Final    GRAM NEGATIVE RODS RECOVERED FROM BOTH BOTTLES Gram Stain Report Called to,Read Back By and Verified With: PHILLIPS,C. AT 0800 ON 05/26/2015 BY RESSEGGER,R. Performed at [facility]    Culture   Final    ESCHERICHIA COLI SUSCEPTIBILITIES PERFORMED ON PREVIOUS CULTURE WITHIN THE LAST 5 DAYS. Performed at [facility]    Report Status 05/28/2015 FINAL  Final  MRSA PCR Screening     Status: None   Collection Time: 05/25/15  8:40 PM  Result Value Ref Range Status   MRSA by PCR NEGATIVE NEGATIVE Final  Comment:        The GeneXpert MRSA Assay (FDA approved for NASAL specimens only), is one component of a comprehensive MRSA colonization surveillance program. It is not intended to diagnose MRSA infection nor to guide or monitor treatment for MRSA infections.    Medical History: Past Medical History  Diagnosis Date  . Hypertension   . GERD (gastroesophageal reflux disease)   . Stroke 2008    left sided weakness  . Alcohol abuse   . Normal cardiac stress test 2008    myoview  . Hepatic steatosis   . Cholelithiasis   . Compression fracture     L1    Medications:  See medication history Assessment: 59 yo lady to cont antibiotic for E coli bacteremia and UTI.  The E coli  in her blood is pan-sensitive.  Goal of Therapy:  Eradication of infection  Plan:  Cont zosyn 3.375 gm IV q8 hours Consider narrowing therapy F/u renal function, cultures and clinical course  Thanks for allowing pharmacy to be a part of this patient's care.  Talbert Cage, PharmD Clinical Pharmacist 05/29/2015,8:54 AM  Addum:  Change to cipro 400 mg IV q12 hours Talbert Cage, PharmD  Addum:  CXR concerning for PNA.  Change antibiotics back to vancomycin and zosyn.  Talbert Cage, PharmD

## 2015-05-30 LAB — BASIC METABOLIC PANEL
Anion gap: 7 (ref 5–15)
BUN: 22 mg/dL — AB (ref 6–20)
CHLORIDE: 112 mmol/L — AB (ref 101–111)
CO2: 21 mmol/L — AB (ref 22–32)
Calcium: 7.4 mg/dL — ABNORMAL LOW (ref 8.9–10.3)
Creatinine, Ser: 0.55 mg/dL (ref 0.44–1.00)
GFR calc Af Amer: >60 mL/min (ref >60)
GFR calc non Af Amer: >60 mL/min (ref >60)
GLUCOSE: 139 mg/dL — AB (ref 65–99)
POTASSIUM: 4.1 mmol/L (ref 3.5–5.1)
Sodium: 140 mmol/L (ref 135–145)

## 2015-05-30 LAB — C DIFFICILE QUICK SCREEN W PCR REFLEX
C DIFFICLE (CDIFF) ANTIGEN: POSITIVE — AB
C Diff toxin: NEGATIVE

## 2015-05-30 LAB — AMMONIA: Ammonia: 31 umol/L (ref 9–35)

## 2015-05-30 LAB — CBC
HEMATOCRIT: 23.5 % — AB (ref 36.0–46.0)
HEMOGLOBIN: 7.9 g/dL — AB (ref 12.0–15.0)
MCH: 43.2 pg — AB (ref 26.0–34.0)
MCHC: 33.6 g/dL (ref 30.0–36.0)
MCV: 128.4 fL — AB (ref 78.0–100.0)
Platelets: 58 10*3/uL — ABNORMAL LOW (ref 150–400)
RBC: 1.83 MIL/uL — AB (ref 3.87–5.11)
RDW: 17 % — ABNORMAL HIGH (ref 11.5–15.5)
WBC: 12.7 10*3/uL — ABNORMAL HIGH (ref 4.0–10.5)

## 2015-05-30 LAB — MAGNESIUM: MAGNESIUM: 2 mg/dL (ref 1.7–2.4)

## 2015-05-30 LAB — TSH: TSH: 4.628 u[IU]/mL — ABNORMAL HIGH (ref 0.350–4.500)

## 2015-05-30 LAB — OCCULT BLOOD X 1 CARD TO LAB, STOOL: FECAL OCCULT BLD: POSITIVE — AB

## 2015-05-30 MED ORDER — LEVALBUTEROL HCL 0.63 MG/3ML IN NEBU
0.6300 mg | INHALATION_SOLUTION | Freq: Three times a day (TID) | RESPIRATORY_TRACT | Status: DC
  Administered 2015-05-31 (×3): 0.63 mg via RESPIRATORY_TRACT
  Filled 2015-05-30 (×3): qty 3

## 2015-05-30 MED ORDER — VANCOMYCIN 50 MG/ML ORAL SOLUTION
125.0000 mg | Freq: Four times a day (QID) | ORAL | Status: DC
  Administered 2015-05-30 – 2015-05-31 (×4): 125 mg via ORAL
  Filled 2015-05-30 (×11): qty 2.5

## 2015-05-30 MED ORDER — VANCOMYCIN 50 MG/ML ORAL SOLUTION
ORAL | Status: AC
Start: 2015-05-30 — End: 2015-05-30
  Filled 2015-05-30: qty 5

## 2015-05-30 MED ORDER — NICOTINE 21 MG/24HR TD PT24
21.0000 mg | MEDICATED_PATCH | Freq: Every day | TRANSDERMAL | Status: DC
  Administered 2015-05-30 – 2015-06-02 (×4): 21 mg via TRANSDERMAL
  Filled 2015-05-30 (×4): qty 1

## 2015-05-30 MED ORDER — HALOPERIDOL LACTATE 5 MG/ML IJ SOLN
0.5000 mg | Freq: Four times a day (QID) | INTRAMUSCULAR | Status: DC | PRN
  Administered 2015-05-30 – 2015-05-31 (×2): 0.5 mg via INTRAVENOUS
  Filled 2015-05-30 (×2): qty 1

## 2015-05-30 NOTE — Progress Notes (Signed)
Pt. Disoriented to place and time. Out of bed. Pt. Put back to bed. Yelling out "I want to get out of this damn bed!" Kicking out at staff. States she is at someone's home. Nurse tech at bedside. Dr. Sherrie Mustache notified. States not to give Valium. She will order Haldol. States to start at low dose.

## 2015-05-30 NOTE — Progress Notes (Signed)
Patient is refusing lab draw this afternoon.  Pt. Educated on importance but she still refuses.  Dr. Sherrie Mustache notified.

## 2015-05-30 NOTE — Progress Notes (Signed)
[facility] PROGRESS NOTE  [name] ZOX:[id number] DOB: 1956-05-27 DOA: 05/25/2015 PCP: Vertis Kelch, NP    Code Status: Full code Family Communication: Discussion with family: Husband Mr. Lucretia Roers is an daughter Misty Stanley on 05/29/15. Disposition Plan: Discharge when clinically appropriate, hopefully in the next 48 hours or 2 days.   Consultants:  None  Procedures: 2-D echocardiogram 05/26/15:Study Conclusions - Left ventricle: The cavity size was normal. Wall thickness was increased in a pattern of mild LVH. Systolic function was vigorous. The estimated ejection fraction was in the range of 70% to 75%. Wall motion was normal; there were no regional wall motion abnormalities. Doppler parameters are consistent with abnormal left ventricular relaxation (grade 1 diastolic dysfunction). - Mitral valve: Calcified annulus. There was trivial regurgitation. - Right atrium: Central venous pressure (est): 3 mm Hg. - Tricuspid valve: There was trivial regurgitation. - Pulmonary arteries: Systolic pressure could not be accurately estimated. - Pericardium, extracardiac: A probable, small pericardial effusion was identified anterior to the heart. There was a left pleural effusion. Impressions: - Mild LVH with LVEF 70-75%, no wall motion abnormalities, and grade 1 diastolic dysfunction. Trivial mitral and tricuspid regurgitation. Probable small anterior pericardial effusion as well as potential small left pleural effusion.  Antibiotics:  Zosyn 05/25/15>>  Vancomycin 05/25/15> 05/28/15  HPI/Subjective: The patient finally agreed to blood draws. She ate approximately half of her lunch yesterday, but very little breakfast this morning per nursing. Patient wants to go home, but denies shortness of breath, abdominal pain, nausea, or vomiting.   Objective: Filed Vitals:   05/30/15 0648  BP: 157/82  Pulse: 89  Temp: 97.5 F (36.4 C)  Resp: 18   oxygen  saturation 97%  Intake/Output Summary (Last 24 hours) at 05/30/15 1201 Last data filed at 05/30/15 0700  Gross per 24 hour  Intake 2356.33 ml  Output   1100 ml  Net 1256.33 ml   Filed Weights   05/25/15 2101 05/26/15 0500 05/27/15 0500  Weight: 62.8 kg (138 lb 7.2 oz) 65.5 kg (144 lb 6.4 oz) 67.5 kg (148 lb 13 oz)    Exam:   General:  Disheveled appearing 59 year old Caucasian woman who appears older than her stated age. Overall, she looks better and is more engaged.  Cardiovascular: S1, S2, with mild tachycardia.  Respiratory: Rare/occasional crackles bilaterally; decrease/resolution of bilateral rhonchi. Breathing nonlabored.  Abdomen: Mildly obese, positive bowel sounds, soft, nontender, nondistended.  (On 05/29/15, the patient had a dark brown, possibly black loose stool witnessed while she was being cleaned).  Musculoskeletal/extremities: No pedal edema.  Neurologic: The patient much more alert and less agitated. Her speech is clear. She follows commands well. She does not appear to have tremulousness. She is actually oriented to the hospital, but she thought the hospital was in [place]. Cranial nerves II through XII are intact.   Data Reviewed: Basic Metabolic Panel:  Recent Labs Lab 05/25/15 1519 05/25/15 2201 05/26/15 0430 05/27/15 0421 05/28/15 0737 05/30/15 0554  NA 129*  --  135 140 139 140  K 3.0*  --  3.6 3.2* 3.1* 4.1  CL 91*  --  104 109 111 112*  CO2 23  --  21* 22 21* 21*  GLUCOSE 78  --  74 109* 127* 139*  BUN 24*  --  30* 49* 42* 22*  CREATININE 1.88*  --  1.38* 1.14* 0.76 0.55  CALCIUM 7.4*  --  6.6* 7.1* 7.1* 7.4*  MG  --  1.6*  --   --   --  2.0   Liver Function Tests:  Recent Labs Lab 05/25/15 1519 05/26/15 0430 05/27/15 0421  AST 168* 163* 193*  ALT 24 23 34  ALKPHOS 72 58 53  BILITOT 5.6* 5.7* 5.7*  PROT 6.9 6.1* 5.5*  ALBUMIN 2.2* 1.9* 1.6*    Recent Labs Lab 05/25/15 1519  LIPASE 75*    Recent Labs Lab 05/25/15 1530  05/30/15 0554  AMMONIA 38* 31   CBC:  Recent Labs Lab 05/25/15 1612 05/26/15 0430 05/27/15 0421 05/27/15 1020 05/28/15 0737 05/30/15 0554  WBC 7.2 6.1 4.4  --  6.8 12.7*  NEUTROABS 6.2  --  3.1  --   --   --   HGB 9.3* 8.5* 8.0*  --  8.5* 7.9*  HCT 27.8* 25.5* 24.4* 23.8* 25.0* 23.5*  MCV 133.7* 134.9* 131.9*  --  129.5* 128.4*  PLT 44* 35* 36*  --  33* 58*   Cardiac Enzymes:  Recent Labs Lab 05/25/15 1519 05/25/15 1723 05/25/15 2201 05/26/15 0430  TROPONINI 0.17* 0.12* 0.16* 0.15*   BNP (last 3 results)  Recent Labs  03/21/15 2209  BNP 53.0    ProBNP (last 3 results) No results for input(s): PROBNP in the last 8760 hours.  CBG: No results for input(s): GLUCAP in the last 168 hours.  Recent Results (from the past 240 hour(s))  Urine culture     Status: None   Collection Time: 05/25/15  3:12 PM  Result Value Ref Range Status   Specimen Description URINE, CATHETERIZED  Final   Special Requests NONE  Final   Culture   Final    80,000 COLONIES/ml ESCHERICHIA COLI Performed at [facility]    Report Status 05/29/2015 FINAL  Final   Organism ID, Bacteria ESCHERICHIA COLI  Final      Susceptibility   Escherichia coli - MIC*    AMPICILLIN <=2 SENSITIVE Sensitive     CEFAZOLIN <=4 SENSITIVE Sensitive     CEFTRIAXONE <=1 SENSITIVE Sensitive     CIPROFLOXACIN <=0.25 SENSITIVE Sensitive     GENTAMICIN <=1 SENSITIVE Sensitive     IMIPENEM <=0.25 SENSITIVE Sensitive     NITROFURANTOIN <=16 SENSITIVE Sensitive     TRIMETH/SULFA <=20 SENSITIVE Sensitive     AMPICILLIN/SULBACTAM <=2 SENSITIVE Sensitive     PIP/TAZO <=4 SENSITIVE Sensitive     * 80,000 COLONIES/ml ESCHERICHIA COLI  Blood Culture (routine x 2)     Status: None   Collection Time: 05/25/15  3:30 PM  Result Value Ref Range Status   Specimen Description BLOOD LEFT ARM  Final   Special Requests BOTTLES DRAWN AEROBIC AND ANAEROBIC 8CC  Final   Culture  Setup Time   Final    GRAM NEGATIVE RODS  RECOVERED FROM BOTH BOTTLES Gram Stain Report Called to,Read Back By and Verified With: HEARN,J. AT 0615 ON 05/26/2015 BY RESSEGGER,R. Performed at [facility]    Culture   Final    ESCHERICHIA COLI Performed at [facility]    Report Status 05/28/2015 FINAL  Final   Organism ID, Bacteria ESCHERICHIA COLI  Final      Susceptibility   Escherichia coli - MIC*    AMPICILLIN 4 SENSITIVE Sensitive     CEFAZOLIN <=4 SENSITIVE Sensitive     CEFEPIME <=1 SENSITIVE Sensitive     CEFTAZIDIME <=1 SENSITIVE Sensitive     CEFTRIAXONE <=1 SENSITIVE Sensitive     CIPROFLOXACIN <=0.25 SENSITIVE Sensitive     GENTAMICIN <=1 SENSITIVE Sensitive     IMIPENEM <=0.25  SENSITIVE Sensitive     TRIMETH/SULFA <=20 SENSITIVE Sensitive     AMPICILLIN/SULBACTAM <=2 SENSITIVE Sensitive     PIP/TAZO <=4 SENSITIVE Sensitive     * ESCHERICHIA COLI  Blood Culture (routine x 2)     Status: None   Collection Time: 05/25/15  3:38 PM  Result Value Ref Range Status   Specimen Description BLOOD RIGHT HAND  Final   Special Requests BOTTLES DRAWN AEROBIC AND ANAEROBIC 5CC  Final   Culture  Setup Time   Final    GRAM NEGATIVE RODS RECOVERED FROM BOTH BOTTLES Gram Stain Report Called to,Read Back By and Verified With: PHILLIPS,C. AT 0800 ON 05/26/2015 BY RESSEGGER,R. Performed at [facility]    Culture   Final    ESCHERICHIA COLI SUSCEPTIBILITIES PERFORMED ON PREVIOUS CULTURE WITHIN THE LAST 5 DAYS. Performed at [facility]    Report Status 05/28/2015 FINAL  Final  MRSA PCR Screening     Status: None   Collection Time: 05/25/15  8:40 PM  Result Value Ref Range Status   MRSA by PCR NEGATIVE NEGATIVE Final    Comment:        The GeneXpert MRSA Assay (FDA approved for NASAL specimens only), is one component of a comprehensive MRSA colonization surveillance program. It is not intended to diagnose MRSA infection nor to guide or monitor treatment for MRSA infections.       Studies: Dg Chest Port 1 View  05/29/2015   CLINICAL DATA:  Rhonchi, alcohol abuse  EXAM: PORTABLE CHEST - 1 VIEW  COMPARISON:  05/26/2015  FINDINGS: Bilateral lower lobe airspace disease, left worse than right. Patchy areas of airspace disease in the upper lobes bilaterally. No pneumothorax. Small left pleural effusion. Stable cardiomediastinal silhouette. No acute osseous abnormality.  IMPRESSION: Bilateral areas of airspace disease most concerning for multilobar pneumonia.   Electronically Signed   By: Elige Ko   On: 05/29/2015 13:21    Scheduled Meds: . antiseptic oral rinse  7 mL Mouth Rinse BID  . feeding supplement (ENSURE ENLIVE)  237 mL Oral BID BM  . folic acid  1 mg Intravenous Daily  . levalbuterol  0.63 mg Nebulization Q4H WA  . methylPREDNISolone (SOLU-MEDROL) injection  40 mg Intravenous Q12H  . pantoprazole (PROTONIX) IV  40 mg Intravenous Q24H  . phytonadione  5 mg Subcutaneous Daily  . piperacillin-tazobactam (ZOSYN)  IV  3.375 g Intravenous Q8H  . sodium chloride  3 mL Intravenous Q12H  . thiamine IV  100 mg Intravenous Daily   Continuous Infusions: . 0.9 % NaCl with KCl 40 mEq / L 50 mL/hr (05/29/15 1232)   Assessment and plan:  Principal Problem:   Acute encephalopathy Active Problems:   Alcoholic cirrhosis   Elevated troponin   Sepsis due to Gram negative bacteria   E-coli UTI   Aspiration pneumonia   ETOH abuse   Hyponatremia   Hypokalemia   Dehydration   Macrocytic anemia   Thrombocytopenia   Compression fracture of L1 lumbar vertebra   Coagulopathy    1. Acute encephalopathy. Etiology likely multifactorial including alcohol withdrawal hepatic encephalopathy, infections, and dehydration. 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 Her vitamin B12 and folate levels were within normal limits. Her TSH is slightly elevated, not necessitating  treatment. Her follow-up ammonia level was within normal limits. 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 -She is being continued on thiamine therapy.  Sepsis secondary to Escherichia coli urinary tract infection with Escherichia coli bacteremia. The patient was started on vancomycin and Zosyn empirically. Urine culture revealed pansensitive Escherichia coli. Vancomycin was discontinued on 9/16. Consideration was made to change antibiotic therapy to Cipro, but will continue Zosyn for treatment of possible aspiration pneumonia. -Patient is afebrile. Her white blood cell count did increase due to IV Solu-Medrol.  Probable aspiration pneumonia. Patient's chest x-ray on admission revealed bibasilar atelectasis. Follow-up chest x-ray on 9/17 reveal bilateral airspace disease concerning for multilobar pneumonia. Given that the patient is an alcoholic, would favor aspiration pneumonia that fluffed out following hydration. -We'll continue Zosyn.  IV Solu-Medrol and Xopenex nebulizer were added. She was given Lasix 10 mg IV 1 empirically. -Her lungs sound better. She is afebrile.  Elevated troponin I. Patient's troponin I was elevated on admission and range from 0.12-0.16. 2-D echocardiogram ordered and revealed EF of 70-75% and no regional wall motion abnormalities. Elevated troponin I was thought to be secondary to demand ischemia.  Alcohol abuse with cirrhosis/associated macrocytosis, thrombocytopenia, coagulopathy. Patient has a long history of alcohol abuse. Per report from family, they have tried multiple times to get her to stop drinking. Per her husband, she has been drinking heavily for more than 30 years. MELD score calculated on admission was 34 ( 56% mortality in 3 months). Patient was started on the CIWA protocol with diazepam. IV thiamine and folate were added subsequently.   Macrocytic anemia and  thrombocytopenia; coagulopathy Patient's anemia panel revealed a total iron of 102, ferritin of 1363, folate of 13.5/RBC folate of 730, and vitamin B12 of 4 18-606. -Findings likely secondary to alcoholic liver disease. We'll continue to monitor. -I she may have had melena, so Hemoccults of stool are pending. Protonix added empirically. Because she is so coagulopathic and she has thrombocytopenia, subcutaneous vitamin K was ordered 3 days. Continue to monitor her CBC: namely her H/H and platelet count in the setting of possible melena. No need for transfusion currently, but there is a low threshold for transfusion of packed red blood cells and platelets. The patient could have alcoholic gastritis.  Hypokalemia and hypomagnesemia. Patient had been refusing oral potassium, so she was given potassium runs and potassium was added to the maintenance IV fluids. 1 g of magnesium sulfate was given empirically. -Her serum potassium and magnesium levels have improved.  Hyponatremia.  Patient's serum sodium was 129 on admission. It has improved with IV fluids. Will continue gentle IV fluids.  Acute kidney injury. The patient's creatinine was 1.88 on admission. With IV fluid hydration, it has normalized.  Reported diarrhea. The patient does have loose stools, but they have been less frequent than when she was first admitted. C. difficile PCR is pending. If she has less than 3 bowel movements over the next 24 hours, will discontinue C. difficile PCR order.  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 I discussed the patient's overall poor prognosis with both her husband and her daughter Misty Stanley. I discussed CODE STATUS with both of them and they wanted to keep the patient as a full code. They expressed their desire for the patient to stop drinking, but she has been a chronic alcoholic for more than 3 decades. More recently, she has become more debilitated over the past month or 2 with  hallucinations, falling, and worsening confusion. Mr. Kohn reported that he would no longer buy any more alcohol for her when she returns home. They  also desired that the patient undergo alcohol rehabilitation. However, the patient is now out of the window for florid DTs. -We'll ask physical therapy to evaluate patient and make recommendations.      Time spent: 35 minutes    [facility]  [facility] Pager [phone]. If 7PM-7AM, please contact night-coverage at [email or web address], password [facility] 05/30/2015, 12:01 PM  LOS: 5 days

## 2015-05-31 LAB — CBC
HCT: 25 % — ABNORMAL LOW (ref 36.0–46.0)
HEMOGLOBIN: 8.3 g/dL — AB (ref 12.0–15.0)
MCH: 42.6 pg — AB (ref 26.0–34.0)
MCHC: 33.2 g/dL (ref 30.0–36.0)
MCV: 128.2 fL — ABNORMAL HIGH (ref 78.0–100.0)
PLATELETS: 93 10*3/uL — AB (ref 150–400)
RBC: 1.95 MIL/uL — ABNORMAL LOW (ref 3.87–5.11)
RDW: 17.2 % — ABNORMAL HIGH (ref 11.5–15.5)
WBC: 17.2 10*3/uL — ABNORMAL HIGH (ref 4.0–10.5)

## 2015-05-31 LAB — COMPREHENSIVE METABOLIC PANEL
ALK PHOS: 145 U/L — AB (ref 38–126)
ALT: 69 U/L — ABNORMAL HIGH (ref 14–54)
ANION GAP: 7 (ref 5–15)
AST: 172 U/L — ABNORMAL HIGH (ref 15–41)
Albumin: 1.9 g/dL — ABNORMAL LOW (ref 3.5–5.0)
BUN: 24 mg/dL — ABNORMAL HIGH (ref 6–20)
CALCIUM: 7.1 mg/dL — AB (ref 8.9–10.3)
CHLORIDE: 111 mmol/L (ref 101–111)
CO2: 20 mmol/L — AB (ref 22–32)
Creatinine, Ser: 0.58 mg/dL (ref 0.44–1.00)
GFR calc non Af Amer: >60 mL/min (ref >60)
GLUCOSE: 158 mg/dL — AB (ref 65–99)
POTASSIUM: 4.4 mmol/L (ref 3.5–5.1)
SODIUM: 138 mmol/L (ref 135–145)
Total Bilirubin: 4.8 mg/dL — ABNORMAL HIGH (ref 0.3–1.2)
Total Protein: 7.2 g/dL (ref 6.5–8.1)

## 2015-05-31 LAB — FOLATE RBC
Folate, Hemolysate: 173.7 ng/mL
Folate, RBC: 730 ng/mL (ref >498)
Hematocrit: 23.8 % — ABNORMAL LOW (ref 34.0–46.6)

## 2015-05-31 MED ORDER — METHYLPREDNISOLONE SODIUM SUCC 40 MG IJ SOLR
40.0000 mg | INTRAMUSCULAR | Status: DC
  Administered 2015-06-01: 40 mg via INTRAVENOUS
  Filled 2015-05-31 (×2): qty 1

## 2015-05-31 MED ORDER — LEVALBUTEROL HCL 0.63 MG/3ML IN NEBU
0.6300 mg | INHALATION_SOLUTION | Freq: Two times a day (BID) | RESPIRATORY_TRACT | Status: DC
  Administered 2015-06-01 – 2015-06-02 (×3): 0.63 mg via RESPIRATORY_TRACT
  Filled 2015-05-31 (×3): qty 3

## 2015-05-31 MED ORDER — SACCHAROMYCES BOULARDII 250 MG PO CAPS
250.0000 mg | ORAL_CAPSULE | Freq: Two times a day (BID) | ORAL | Status: DC
Start: 2015-05-31 — End: 2015-06-02
  Administered 2015-05-31 – 2015-06-02 (×4): 250 mg via ORAL
  Filled 2015-05-31 (×4): qty 1

## 2015-05-31 NOTE — Progress Notes (Signed)
[facility] PROGRESS NOTE  DEIDRA SPEASE PQZ:[id number] DOB: [birth year] DOA: 05/25/2015 PCP: Dionisio Paschal, NP  Assessment/Plan: Acute Encephalopathy: likely multifactorial - alcohol withdrawal, infection, severe dehydration hepatic encephalopathy. Up in chair and alert but remains uncooperative and  confused. CIWA score 11 this am. Lactic acid normalized. Vit B12 and folate within normal limits.  Ammonia level with in limits of normal 05/30/15. Remains afebrile and stable. No vomiting or diarrhea since admission.CT abdomen pelvis without contrast (AKI) shows new compression fx and possible pna. 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 Blood cultures with gram negative rod. Continue the  Zosyn day #7. She received vanomycin for 3 days as well. Evaluated by PT who recommend snf  sepsis due to gram negative bacteremia: per blood culture. Likely related to UTI.white count trending up but this is likely due to steroids. Continue anti-biotics as above. See #1.   UTI: culture with E Coli. Urine culture pan sensitive.  Antibiotics as above.   Probable aspiration pneumonia: per chest xray 05/29/15. Continue zosyn day #7. Will taper steroids. Continue nebs. Oxygen saturation level >90% on room air. She is afebrile and non-toxic appearing.  Cirrhosis: ETOH abuse. Chart review indicates hepatitis panel from 03/2015 nml. On admission MELD score 34 (56% mortality in 3 mo) - slightly scewed due to dehydration/AKI. AST trending up ALT/ALK Phos within limits of normal, total bilie stable 5.7 up from 2 months ago.    AKI: Resolved. Baseline 0.9. Urine output good. Hold nephrotoxins and monitor. BMET in am  Thrombocytopenia: likely hepatic etiology given ETOH abuse hx. Improving this am. No s/sx bleeding. monitor  Hyponatremia/Hypokalemia: hyponatremia resolved. Hypokalemia resolved. monitor.   Elevated Troponin: 0.17 on admission and stable x3. Initial EKG w/o ACS. Repeat  EKG unchanged. Likely related to demand ischemia in setting of #2. Echo reveals wall thickness wasincreased in a pattern of mild LVH. Systolic function wasvigorous. The estimated ejection fraction was in the range of 70%to 75%. Wall motion was normal; there were no regional wallmotion abnormalities. Doppler parameters are consistent withabnormal left ventricular relaxation (grade 1 diastolicdysfunction). No chest pain.   Macrocytic Anemia: Hgb stable at 8.0 likely influenced by vigorous IV fluids. Baseilne 12. Likely related to malnutrition and Cirrhosis. Folate and B12 within limits of normal. No evidence of bleeding. Patient refusing lab draws. S/p vitamin K x3 related to reported melena. FOBT positive. Continue protonix. Consider GI consult  ETOH abuse: chronic condition for pt. Last ETOH on 05/21/15 per pt. ETOH level nml. CIWA score 0 this am. See social work note. She would benefit detox  Hypercalcemia: chronic and trending down per chart review. Likely related to above. Will monitor  Chronic debilitation/failure to thrive: progressing over last several months. Pt evaluated and recommended snf  Code Status: full Family Communication: none present Disposition Plan: snf   Consultants:  none  Procedures: 2-D echocardiogram 05/26/15:Study Conclusions - Left ventricle: The cavity size was normal. Wall thickness was increased in a pattern of mild LVH. Systolic function was vigorous. The estimated ejection fraction was in the range of 70% to 75%. Wall motion was normal; there were no regional wall motion abnormalities. Doppler parameters are consistent with abnormal left ventricular relaxation (grade 1 diastolic dysfunction). - Mitral valve: Calcified annulus. There was trivial regurgitation. - Right atrium: Central venous pressure (est): 3 mm Hg. - Tricuspid valve: There was trivial regurgitation. - Pulmonary arteries: Systolic pressure could not be  accurately estimated. - Pericardium, extracardiac: A probable, small pericardial effusion was  identified anterior to the heart. There was a left pleural effusion. Impressions: - Mild LVH with LVEF 70-75%, no wall motion abnormalities, and grade 1 diastolic dysfunction. Trivial mitral and tricuspid regurgitation. Probable small anterior pericardial effusion as well as potential small left pleural effusion.  Antibiotics:  Zosyn 05/25/15>>  Vancomycin 05/25/15> 05/28/15  HPI/Subjective: Sitting in chair. Denies pain/discomfort. Believes she is at fathers house. Becomes agitated when re-oriented  Objective: Filed Vitals:   05/31/15 1547  BP: 143/72  Pulse: 93  Temp: 98.6 F (37 C)  Resp: 18    Intake/Output Summary (Last 24 hours) at 05/31/15 1555 Last data filed at 05/31/15 0603  Gross per 24 hour  Intake 620.83 ml  Output    550 ml  Net  70.83 ml   Filed Weights   05/25/15 2101 05/26/15 0500 05/27/15 0500  Weight: 62.8 kg (138 lb 7.2 oz) 65.5 kg (144 lb 6.4 oz) 67.5 kg (148 lb 13 oz)    Exam:   General:  wll nourished no acute distress  Cardiovascular: rrr no m/g/r no LE edema  Respiratory: normal effort BS clear bilaterally no wheeze  Abdomen: soft +BS non-tender to palpation  Musculoskeletal: no swelling/erythema   Data Reviewed: Basic Metabolic Panel:  Recent Labs Lab 05/25/15 2201 05/26/15 0430 05/27/15 0421 05/28/15 0737 05/30/15 0554 05/31/15 1205  NA  --  135 140 139 140 138  K  --  3.6 3.2* 3.1* 4.1 4.4  CL  --  104 109 111 112* 111  CO2  --  21* 22 21* 21* 20*  GLUCOSE  --  74 109* 127* 139* 158*  BUN  --  30* 49* 42* 22* 24*  CREATININE  --  1.38* 1.14* 0.76 0.55 0.58  CALCIUM  --  6.6* 7.1* 7.1* 7.4* 7.1*  MG 1.6*  --   --   --  2.0  --    Liver Function Tests:  Recent Labs Lab 05/25/15 1519 05/26/15 0430 05/27/15 0421 05/31/15 1205  AST 168* 163* 193* 172*  ALT 24 23 34 69*  ALKPHOS 72 58 53 145*  BILITOT 5.6*  5.7* 5.7* 4.8*  PROT 6.9 6.1* 5.5* 7.2  ALBUMIN 2.2* 1.9* 1.6* 1.9*    Recent Labs Lab 05/25/15 1519  LIPASE 75*    Recent Labs Lab 05/25/15 1530 05/30/15 0554  AMMONIA 38* 31   CBC:  Recent Labs Lab 05/25/15 1612 05/26/15 0430 05/27/15 0421 05/27/15 1020 05/28/15 0737 05/30/15 0554 05/31/15 1205  WBC 7.2 6.1 4.4  --  6.8 12.7* 17.2*  NEUTROABS 6.2  --  3.1  --   --   --   --   HGB 9.3* 8.5* 8.0*  --  8.5* 7.9* 8.3*  HCT 27.8* 25.5* 24.4* 23.8* 25.0* 23.5* 25.0*  MCV 133.7* 134.9* 131.9*  --  129.5* 128.4* 128.2*  PLT 44* 35* 36*  --  33* 58* 93*   Cardiac Enzymes:  Recent Labs Lab 05/25/15 1519 05/25/15 1723 05/25/15 2201 05/26/15 0430  TROPONINI 0.17* 0.12* 0.16* 0.15*   BNP (last 3 results)  Recent Labs  03/21/15 2209  BNP 53.0    ProBNP (last 3 results) No results for input(s): PROBNP in the last 8760 hours.  CBG: No results for input(s): GLUCAP in the last 168 hours.  Recent Results (from the past 240 hour(s))  Urine culture     Status: None   Collection Time: 05/25/15  3:12 PM  Result Value Ref Range Status   Specimen Description URINE, CATHETERIZED  Final   Special Requests NONE  Final   Culture   Final    80,000 COLONIES/ml ESCHERICHIA COLI Performed at [facility]    Report Status 05/29/2015 FINAL  Final   Organism ID, Bacteria ESCHERICHIA COLI  Final      Susceptibility   Escherichia coli - MIC*    AMPICILLIN <=2 SENSITIVE Sensitive     CEFAZOLIN <=4 SENSITIVE Sensitive     CEFTRIAXONE <=1 SENSITIVE Sensitive     CIPROFLOXACIN <=0.25 SENSITIVE Sensitive     GENTAMICIN <=1 SENSITIVE Sensitive     IMIPENEM <=0.25 SENSITIVE Sensitive     NITROFURANTOIN <=16 SENSITIVE Sensitive     TRIMETH/SULFA <=20 SENSITIVE Sensitive     AMPICILLIN/SULBACTAM <=2 SENSITIVE Sensitive     PIP/TAZO <=4 SENSITIVE Sensitive     * 80,000 COLONIES/ml ESCHERICHIA COLI  Blood Culture (routine x 2)     Status: None   Collection Time: 05/25/15   3:30 PM  Result Value Ref Range Status   Specimen Description BLOOD LEFT ARM  Final   Special Requests BOTTLES DRAWN AEROBIC AND ANAEROBIC 8CC  Final   Culture  Setup Time   Final    GRAM NEGATIVE RODS RECOVERED FROM BOTH BOTTLES Gram Stain Report Called to,Read Back By and Verified With: HEARN,J. AT 0615 ON 05/26/2015 BY RESSEGGER,R. Performed at [facility]    Culture   Final    ESCHERICHIA COLI Performed at [facility]    Report Status 05/28/2015 FINAL  Final   Organism ID, Bacteria ESCHERICHIA COLI  Final      Susceptibility   Escherichia coli - MIC*    AMPICILLIN 4 SENSITIVE Sensitive     CEFAZOLIN <=4 SENSITIVE Sensitive     CEFEPIME <=1 SENSITIVE Sensitive     CEFTAZIDIME <=1 SENSITIVE Sensitive     CEFTRIAXONE <=1 SENSITIVE Sensitive     CIPROFLOXACIN <=0.25 SENSITIVE Sensitive     GENTAMICIN <=1 SENSITIVE Sensitive     IMIPENEM <=0.25 SENSITIVE Sensitive     TRIMETH/SULFA <=20 SENSITIVE Sensitive     AMPICILLIN/SULBACTAM <=2 SENSITIVE Sensitive     PIP/TAZO <=4 SENSITIVE Sensitive     * ESCHERICHIA COLI  Blood Culture (routine x 2)     Status: None   Collection Time: 05/25/15  3:38 PM  Result Value Ref Range Status   Specimen Description BLOOD RIGHT HAND  Final   Special Requests BOTTLES DRAWN AEROBIC AND ANAEROBIC 5CC  Final   Culture  Setup Time   Final    GRAM NEGATIVE RODS RECOVERED FROM BOTH BOTTLES Gram Stain Report Called to,Read Back By and Verified With: PHILLIPS,C. AT 0800 ON 05/26/2015 BY RESSEGGER,R. Performed at [facility]    Culture   Final    ESCHERICHIA COLI SUSCEPTIBILITIES PERFORMED ON PREVIOUS CULTURE WITHIN THE LAST 5 DAYS. Performed at [facility]    Report Status 05/28/2015 FINAL  Final  MRSA PCR Screening     Status: None   Collection Time: 05/25/15  8:40 PM  Result Value Ref Range Status   MRSA by PCR NEGATIVE NEGATIVE Final    Comment:        The GeneXpert MRSA Assay (FDA approved for NASAL  specimens only), is one component of a comprehensive MRSA colonization surveillance program. It is not intended to diagnose MRSA infection nor to guide or monitor treatment for MRSA infections.   C difficile quick scan w PCR reflex     Status: Abnormal   Collection Time: 05/30/15  5:20 PM  Result Value Ref Range Status   C Diff antigen POSITIVE (A) NEGATIVE Final    Comment: RESULT CALLED TO, READ BACK BY AND VERIFIED WITH: JOHNSON,B AT 1934 ON 05/30/15 BY MOSLEY,J    C Diff toxin NEGATIVE NEGATIVE Final   C Diff interpretation   Final    C. difficile present, but toxin not detected. This indicates colonization. In most cases, this does not require treatment. If patient has signs and symptoms consistent with colitis, consider treatment.     Studies: No results found.  Scheduled Meds: . antiseptic oral rinse  7 mL Mouth Rinse BID  . feeding supplement (ENSURE ENLIVE)  237 mL Oral BID BM  . folic acid  1 mg Intravenous Daily  . levalbuterol  0.63 mg Nebulization TID  . methylPREDNISolone (SOLU-MEDROL) injection  40 mg Intravenous Q12H  . nicotine  21 mg Transdermal Daily  . pantoprazole (PROTONIX) IV  40 mg Intravenous Q24H  . phytonadione  5 mg Subcutaneous Daily  . piperacillin-tazobactam (ZOSYN)  IV  3.375 g Intravenous Q8H  . sodium chloride  3 mL Intravenous Q12H  . thiamine IV  100 mg Intravenous Daily  . vancomycin  125 mg Oral QID   Continuous Infusions: . 0.9 % NaCl with KCl 40 mEq / L 50 mL/hr (05/30/15 1609)    Principal Problem:   Acute encephalopathy Active Problems:   Hyponatremia   Hypokalemia   Dehydration   Macrocytic anemia   Alcoholic cirrhosis   Thrombocytopenia   Elevated troponin   ETOH abuse   Compression fracture of L1 lumbar vertebra   Sepsis due to Gram negative bacteria   E-coli UTI   Coagulopathy   Aspiration pneumonia    Time spent: 35 minutes    [facility] Pager [phone]. If 7PM-7AM, please contact  night-coverage at [email or web address], password [facility] 05/31/2015, 3:55 PM  LOS: 6 days

## 2015-05-31 NOTE — Evaluation (Signed)
Physical Therapy Evaluation Patient Details Name: [name] MRN: [id number] DOB: [birth year] Today's Date: 05/31/2015   History of Present Illness  Per report pt has been very ill over the last 4-5 days. She has eaten little to nothing over this period of time. She's had multiple daily bouts of nonbloody emesis and diarrhea, which is getting worse.. This has been associated with complaint of stomach pain and per patient's daughter she says that the patient's abdomen is tender to palpation. Unsure of patient's last alcoholic drink, though at baseline she drinks very heavily. Of note over the last 3 weeks or so patient has been seen in [place] for lower extremity leg wounds for which she was receiving antibiotics. Patient's daughter states that she's been taking the antibiotics sporadically and was unsure of what her last dose was. She is also been fairly weak in general and has fallen multiple times over this period of time per family.  Clinical Impression   Pt was seen for evaluation.  She was alert but extremely disoriented.  She was cooperative and able to follow 50% of directions with functional activity.  She had more difficulty with more focused tasks (i.e. Muscle testing).  She is found to be very weak throughout and needed mod to max assist for bed mobility and transfers.  Her standing balance is poor and she is unable to walk.  I am recommending SNF at d/c.  If she goes home, she will need a w/c and full time assist.    Follow Up Recommendations SNF    Equipment Recommendations  None recommended by PT    Recommendations for Other Services   OT    Precautions / Restrictions Precautions Precautions: Fall Restrictions Weight Bearing Restrictions: No      Mobility  Bed Mobility Overal bed mobility: Needs Assistance Bed Mobility: Sidelying to Sit;Supine to Sit     Supine to sit: Mod assist;HOB elevated        Transfers Overall transfer level: Needs assistance Equipment  used: Rolling walker (2 wheeled) Transfers: Sit to/from [employer] Transfers Sit to Stand: Max assist   Squat pivot transfers: Mod assist;From elevated surface     General transfer comment: once standing she has poor standing balance, falls backward  Ambulation/Gait             General Gait Details: unable to ambulate                    Balance Overall balance assessment: Needs assistance Sitting-balance support: No upper extremity supported;Feet supported Sitting balance-Leahy Scale: Good     Standing balance support: No upper extremity supported Standing balance-Leahy Scale: Poor Standing balance comment: falls backward                             Pertinent Vitals/Pain Pain Assessment: No/denies pain    Home Living Family/patient expects to be discharged to:: Skilled nursing facility                      Prior Function           Comments: unknown             Extremity/Trunk Assessment               Lower Extremity Assessment: Generalized weakness (based on observation...unable to do a formal muscle test due to poor cognition)      Cervical / Trunk  Assessment: Kyphotic  Communication   Communication: No difficulties  Cognition Arousal/Alertness: Awake/alert Behavior During Therapy: WFL for tasks assessed/performed Overall Cognitive Status: No family/caregiver present to determine baseline cognitive functioning (extremely disoriented, only able to follow 50% of directions)                                    Assessment/Plan    PT Assessment Patient needs continued PT services  PT Diagnosis Difficulty walking;Generalized weakness   PT Problem List Decreased strength;Decreased activity tolerance;Decreased balance;Decreased mobility;Decreased cognition;Decreased knowledge of use of DME;Decreased knowledge of precautions  PT Treatment Interventions Functional mobility  training;Therapeutic exercise;Balance training   PT Goals (Current goals can be found in the Care Plan section) Acute Rehab PT Goals PT Goal Formulation: Patient unable to participate in goal setting Time For Goal Achievement: 06/14/15 Potential to Achieve Goals: Fair    Frequency Min 3X/week   Barriers to discharge   unknown                   End of Session Equipment Utilized During Treatment: Gait belt Activity Tolerance: Patient tolerated treatment well Patient left: in chair;with call bell/phone within reach;with chair alarm set Nurse Communication: Mobility status         Time: 0825-0903 PT Time Calculation (min) (ACUTE ONLY): 38 min   Charges:   PT Evaluation $Initial PT Evaluation Tier I: 1 Procedure     PT G CodesMyrlene Broker L  PT 05/31/2015, 10:25 AM [phone]

## 2015-05-31 NOTE — Plan of Care (Signed)
Pt continues to refuse lab draws - RN contacted lab to attempt again, after Haldol injection, but still no luck.  Patient also refusing to eat and only wants occasional sips of water.  Refusing all PO meds - indicating she already took.  Will have lab re-attempt when patient falls asleep.

## 2015-06-01 ENCOUNTER — Inpatient Hospital Stay (HOSPITAL_COMMUNITY): Payer: Medicaid Other

## 2015-06-01 DIAGNOSIS — E876 Hypokalemia: Secondary | ICD-10-CM

## 2015-06-01 LAB — CBC
HCT: 23 % — ABNORMAL LOW (ref 36.0–46.0)
Hemoglobin: 7.8 g/dL — ABNORMAL LOW (ref 12.0–15.0)
MCH: 43.3 pg — ABNORMAL HIGH (ref 26.0–34.0)
MCHC: 33.9 g/dL (ref 30.0–36.0)
MCV: 127.8 fL — AB (ref 78.0–100.0)
PLATELETS: 83 10*3/uL — AB (ref 150–400)
RBC: 1.8 MIL/uL — ABNORMAL LOW (ref 3.87–5.11)
RDW: 17 % — AB (ref 11.5–15.5)
WBC: 16.6 10*3/uL — AB (ref 4.0–10.5)

## 2015-06-01 LAB — BASIC METABOLIC PANEL
ANION GAP: 4 — AB (ref 5–15)
BUN: 24 mg/dL — ABNORMAL HIGH (ref 6–20)
CALCIUM: 7.1 mg/dL — AB (ref 8.9–10.3)
CO2: 22 mmol/L (ref 22–32)
CREATININE: 0.65 mg/dL (ref 0.44–1.00)
Chloride: 111 mmol/L (ref 101–111)
GFR calc Af Amer: >60 mL/min (ref >60)
GLUCOSE: 128 mg/dL — AB (ref 65–99)
Potassium: 4.4 mmol/L (ref 3.5–5.1)
Sodium: 137 mmol/L (ref 135–145)

## 2015-06-01 MED ORDER — LEVOFLOXACIN 750 MG PO TABS
750.0000 mg | ORAL_TABLET | Freq: Every day | ORAL | Status: DC
  Administered 2015-06-01 – 2015-06-02 (×2): 750 mg via ORAL
  Filled 2015-06-01 (×2): qty 1

## 2015-06-01 MED ORDER — VITAMIN B-1 100 MG PO TABS
100.0000 mg | ORAL_TABLET | Freq: Every day | ORAL | Status: DC
  Administered 2015-06-01 – 2015-06-02 (×2): 100 mg via ORAL
  Filled 2015-06-01 (×2): qty 1

## 2015-06-01 MED ORDER — FOLIC ACID 1 MG PO TABS
1.0000 mg | ORAL_TABLET | Freq: Every day | ORAL | Status: DC
  Administered 2015-06-01 – 2015-06-02 (×2): 1 mg via ORAL
  Filled 2015-06-01 (×2): qty 1

## 2015-06-01 MED ORDER — OXYCODONE HCL 5 MG PO TABS
5.0000 mg | ORAL_TABLET | ORAL | Status: DC | PRN
  Administered 2015-06-01 (×2): 5 mg via ORAL
  Filled 2015-06-01 (×2): qty 1

## 2015-06-01 MED ORDER — METOPROLOL SUCCINATE ER 25 MG PO TB24
25.0000 mg | ORAL_TABLET | Freq: Every day | ORAL | Status: DC
  Administered 2015-06-01 – 2015-06-02 (×2): 25 mg via ORAL
  Filled 2015-06-01 (×2): qty 1

## 2015-06-01 MED ORDER — PANTOPRAZOLE SODIUM 40 MG PO TBEC
40.0000 mg | DELAYED_RELEASE_TABLET | Freq: Every day | ORAL | Status: DC
  Administered 2015-06-01: 40 mg via ORAL
  Filled 2015-06-01: qty 1

## 2015-06-01 MED ORDER — PREDNISONE 20 MG PO TABS
50.0000 mg | ORAL_TABLET | Freq: Every day | ORAL | Status: DC
  Administered 2015-06-02: 50 mg via ORAL
  Filled 2015-06-01: qty 1
  Filled 2015-06-01: qty 2

## 2015-06-01 NOTE — Clinical Social Work Note (Signed)
CSW met with pt's husband at bedside. Shared recommendation by PT for SNF and offered private pay placement as pt does not have insurance. Pt's husband indicates that this is not feasible and he plans to take pt home. CM notified for home health/equipment needs. NP updated. CSW will sign off. Anticipate d/c later today.   Benay Pike, [place]

## 2015-06-01 NOTE — Progress Notes (Signed)
[facility] PROGRESS NOTE  [name] KVQ:[id number] DOB: Jan 30, 1956 DOA: 05/25/2015 PCP: Dionisio Paschal, NP  Assessment/Plan: Acute Encephalopathy: much improved this am. More engaging.  likely multifactorial - alcohol withdrawal, infection, severe dehydration hepatic encephalopathy. CIWA score 3 this am. Lactic acid normalized. Vit B12 and folate within normal limits. Ammonia level with in limits of normal 05/30/15. Remains afebrile and stable. No vomiting or diarrhea since admission.CT abdomen pelvis without contrast (AKI) shows new compression fx and possible pna. 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 Blood cultures with gram negative rod.will stop Zosyn and provide levaquin. She received vanomycin for 3 days as well. Evaluated by PT who recommend snf  sepsis due to gram negative bacteremia: per blood culture. Likely related to UTI.white count trending down. Continue anti-biotics as above. See #1.   UTI: culture with E Coli. Urine culture pan sensitive. Antibiotics as above.   Probable aspiration pneumonia: per chest xray 05/29/15. Continue zosyn day #7. Will transition steroids to oral. Continue nebs. Oxygen saturation level >90% on room air. She is afebrile and non-toxic appearing.  Compression fracture L1: complains low back pain this am. Will adjust pain medication slightly. Up with PT and needs assist but able to bear weight. Doubt candidate for kyphoplasty.   Cirrhosis: ETOH abuse. Chart review indicates hepatitis panel from 03/2015 nml. On admission MELD score 34 (56% mortality in 3 mo) - slightly scewed due to dehydration/AKI. AST trending up ALT/ALK Phos within limits of normal, total bilie stable 5.7 up from 2 months ago.   AKI: Resolved. Baseline 0.9. Urine output good. Hold nephrotoxins and monitor. BMET in am  Thrombocytopenia: likely hepatic etiology given ETOH abuse hx. Stable. No s/sx bleeding. monitor  Hyponatremia/Hypokalemia:  hyponatremia resolved. Hypokalemia resolved. monitor.   Elevated Troponin: 0.17 on admission and stable x3. Initial EKG w/o ACS. Repeat EKG unchanged. Likely related to demand ischemia in setting of #2. Echo reveals wall thickness wasincreased in a pattern of mild LVH. Systolic function wasvigorous. The estimated ejection fraction was in the range of 70%to 75%. Wall motion was normal; there were no regional wallmotion abnormalities. Doppler parameters are consistent withabnormal left ventricular relaxation (grade 1 diastolicdysfunction). No chest pain.   Macrocytic Anemia: Hgb trending down to 7.8. Baseilne 12. Likely related to malnutrition and Cirrhosis. Folate and B12 within limits of normal. No evidence of bleeding. S/p vitamin K x3 related to reported melena. FOBT positive. Continue protonix. Consider GI consult op. Consider transfusion  ETOH abuse: chronic condition for pt. Last ETOH on 05/21/15 per pt. ETOH level nml. CIWA score 3 this am. See social work note. She would benefit detox but will be going home with family  Hypercalcemia: chronic and stable.  Likely related to above. Will monitor  Chronic debilitation/failure to thrive: progressing over last several months. Pt evaluated and recommended snf. Will be going [facility] with [facility]   Code Status: full Family Communication: husband at bedside Disposition Plan: home tomorrow   Consultants:  None   Procedures: 2-D echocardiogram 05/26/15:Study Conclusions - Left ventricle: The cavity size was normal. Wall thickness was increased in a pattern of mild LVH. Systolic function was vigorous. The estimated ejection fraction was in the range of 70% to 75%. Wall motion was normal; there were no regional wall motion abnormalities. Doppler parameters are consistent with abnormal left ventricular relaxation (grade 1 diastolic dysfunction). - Mitral valve: Calcified annulus. There was trivial regurgitation. - Right atrium:  Central venous pressure (est): 3 mm Hg. - Tricuspid  valve: There was trivial regurgitation. - Pulmonary arteries: Systolic pressure could not be accurately estimated. - Pericardium, extracardiac: A probable, small pericardial effusion was identified anterior to the heart. There was a left pleural effusion. Impressions: - Mild LVH with LVEF 70-75%, no wall motion abnormalities, and grade 1 diastolic dysfunction. Trivial mitral and tricuspid regurgitation. Probable small anterior pericardial effusion as well as potential small left pleural effusion.   Antibiotics:  Zosyn 05/25/15>>05/31/15  Vancomycin 05/25/15> 05/28/15  levaquin 06/01/15  HPI/Subjective: Reports pain in low back this am. Denies numbness tingling of LE  Objective: Filed Vitals:   06/01/15 1501  BP: 142/94  Pulse: 103  Temp: 97.5 F (36.4 C)  Resp: 18    Intake/Output Summary (Last 24 hours) at 06/01/15 1529 Last data filed at 06/01/15 1230  Gross per 24 hour  Intake 2333.33 ml  Output      0 ml  Net 2333.33 ml   Filed Weights   05/25/15 2101 05/26/15 0500 05/27/15 0500  Weight: 62.8 kg (138 lb 7.2 oz) 65.5 kg (144 lb 6.4 oz) 67.5 kg (148 lb 13 oz)    Exam:   General:  Well nourished appears chronically ill but comfortable  Cardiovascular: rrr no MGR no LE edema  Respiratory: normal effort BS somewhat coarse no wheeze  Abdomen: soft +BS non-tender to palpation  Musculoskeletal: no clubbing or cyanosis   Data Reviewed: Basic Metabolic Panel:  Recent Labs Lab 05/25/15 2201  05/27/15 0421 05/28/15 0737 05/30/15 0554 05/31/15 1205 06/01/15 0628  NA  --   < > 140 139 140 138 137  K  --   < > 3.2* 3.1* 4.1 4.4 4.4  CL  --   < > 109 111 112* 111 111  CO2  --   < > 22 21* 21* 20* 22  GLUCOSE  --   < > 109* 127* 139* 158* 128*  BUN  --   < > 49* 42* 22* 24* 24*  CREATININE  --   < > 1.14* 0.76 0.55 0.58 0.65  CALCIUM  --   < > 7.1* 7.1* 7.4* 7.1* 7.1*  MG 1.6*  --   --   --   2.0  --   --   < > = values in this interval not displayed. Liver Function Tests:  Recent Labs Lab 05/26/15 0430 05/27/15 0421 05/31/15 1205  AST 163* 193* 172*  ALT 23 34 69*  ALKPHOS 58 53 145*  BILITOT 5.7* 5.7* 4.8*  PROT 6.1* 5.5* 7.2  ALBUMIN 1.9* 1.6* 1.9*   No results for input(s): LIPASE, AMYLASE in the last 168 hours.  Recent Labs Lab 05/25/15 1530 05/30/15 0554  AMMONIA 38* 31   CBC:  Recent Labs Lab 05/25/15 1612  05/27/15 0421 05/27/15 1020 05/28/15 0737 05/30/15 0554 05/31/15 1205 06/01/15 0628  WBC 7.2  < > 4.4  --  6.8 12.7* 17.2* 16.6*  NEUTROABS 6.2  --  3.1  --   --   --   --   --   HGB 9.3*  < > 8.0*  --  8.5* 7.9* 8.3* 7.8*  HCT 27.8*  < > 24.4* 23.8* 25.0* 23.5* 25.0* 23.0*  MCV 133.7*  < > 131.9*  --  129.5* 128.4* 128.2* 127.8*  PLT 44*  < > 36*  --  33* 58* 93* 83*  < > = values in this interval not displayed. Cardiac Enzymes:  Recent Labs Lab 05/25/15 1723 05/25/15 2201 05/26/15 0430  TROPONINI 0.12* 0.16* 0.15*  BNP (last 3 results)  Recent Labs  03/21/15 2209  BNP 53.0    ProBNP (last 3 results) No results for input(s): PROBNP in the last 8760 hours.  CBG: No results for input(s): GLUCAP in the last 168 hours.  Recent Results (from the past 240 hour(s))  Urine culture     Status: None   Collection Time: 05/25/15  3:12 PM  Result Value Ref Range Status   Specimen Description URINE, CATHETERIZED  Final   Special Requests NONE  Final   Culture   Final    80,000 COLONIES/ml ESCHERICHIA COLI Performed at [facility]    Report Status 05/29/2015 FINAL  Final   Organism ID, Bacteria ESCHERICHIA COLI  Final      Susceptibility   Escherichia coli - MIC*    AMPICILLIN <=2 SENSITIVE Sensitive     CEFAZOLIN <=4 SENSITIVE Sensitive     CEFTRIAXONE <=1 SENSITIVE Sensitive     CIPROFLOXACIN <=0.25 SENSITIVE Sensitive     GENTAMICIN <=1 SENSITIVE Sensitive     IMIPENEM <=0.25 SENSITIVE Sensitive      NITROFURANTOIN <=16 SENSITIVE Sensitive     TRIMETH/SULFA <=20 SENSITIVE Sensitive     AMPICILLIN/SULBACTAM <=2 SENSITIVE Sensitive     PIP/TAZO <=4 SENSITIVE Sensitive     * 80,000 COLONIES/ml ESCHERICHIA COLI  Blood Culture (routine x 2)     Status: None   Collection Time: 05/25/15  3:30 PM  Result Value Ref Range Status   Specimen Description BLOOD LEFT ARM  Final   Special Requests BOTTLES DRAWN AEROBIC AND ANAEROBIC 8CC  Final   Culture  Setup Time   Final    GRAM NEGATIVE RODS RECOVERED FROM BOTH BOTTLES Gram Stain Report Called to,Read Back By and Verified With: HEARN,J. AT 0615 ON 05/26/2015 BY RESSEGGER,R. Performed at [facility]    Culture   Final    ESCHERICHIA COLI Performed at [facility]    Report Status 05/28/2015 FINAL  Final   Organism ID, Bacteria ESCHERICHIA COLI  Final      Susceptibility   Escherichia coli - MIC*    AMPICILLIN 4 SENSITIVE Sensitive     CEFAZOLIN <=4 SENSITIVE Sensitive     CEFEPIME <=1 SENSITIVE Sensitive     CEFTAZIDIME <=1 SENSITIVE Sensitive     CEFTRIAXONE <=1 SENSITIVE Sensitive     CIPROFLOXACIN <=0.25 SENSITIVE Sensitive     GENTAMICIN <=1 SENSITIVE Sensitive     IMIPENEM <=0.25 SENSITIVE Sensitive     TRIMETH/SULFA <=20 SENSITIVE Sensitive     AMPICILLIN/SULBACTAM <=2 SENSITIVE Sensitive     PIP/TAZO <=4 SENSITIVE Sensitive     * ESCHERICHIA COLI  Blood Culture (routine x 2)     Status: None   Collection Time: 05/25/15  3:38 PM  Result Value Ref Range Status   Specimen Description BLOOD RIGHT HAND  Final   Special Requests BOTTLES DRAWN AEROBIC AND ANAEROBIC 5CC  Final   Culture  Setup Time   Final    GRAM NEGATIVE RODS RECOVERED FROM BOTH BOTTLES Gram Stain Report Called to,Read Back By and Verified With: PHILLIPS,C. AT 0800 ON 05/26/2015 BY RESSEGGER,R. Performed at [facility]    Culture   Final    ESCHERICHIA COLI SUSCEPTIBILITIES PERFORMED ON PREVIOUS CULTURE WITHIN THE LAST 5 DAYS. Performed  at [facility]    Report Status 05/28/2015 FINAL  Final  MRSA PCR Screening     Status: None   Collection Time: 05/25/15  8:40 PM  Result Value  Ref Range Status   MRSA by PCR NEGATIVE NEGATIVE Final    Comment:        The GeneXpert MRSA Assay (FDA approved for NASAL specimens only), is one component of a comprehensive MRSA colonization surveillance program. It is not intended to diagnose MRSA infection nor to guide or monitor treatment for MRSA infections.   C difficile quick scan w PCR reflex     Status: Abnormal   Collection Time: 05/30/15  5:20 PM  Result Value Ref Range Status   C Diff antigen POSITIVE (A) NEGATIVE Final    Comment: RESULT CALLED TO, READ BACK BY AND VERIFIED WITH: JOHNSON,B AT 1934 ON 05/30/15 BY MOSLEY,J    C Diff toxin NEGATIVE NEGATIVE Final   C Diff interpretation   Final    C. difficile present, but toxin not detected. This indicates colonization. In most cases, this does not require treatment. If patient has signs and symptoms consistent with colitis, consider treatment.     Studies: No results found.  Scheduled Meds: . antiseptic oral rinse  7 mL Mouth Rinse BID  . feeding supplement (ENSURE ENLIVE)  237 mL Oral BID BM  . folic acid  1 mg Oral Daily  . levalbuterol  0.63 mg Nebulization BID  . levofloxacin  750 mg Oral Daily  . methylPREDNISolone (SOLU-MEDROL) injection  40 mg Intravenous Q24H  . metoprolol succinate  25 mg Oral Daily  . nicotine  21 mg Transdermal Daily  . pantoprazole  40 mg Oral Q supper  . saccharomyces boulardii  250 mg Oral BID  . sodium chloride  3 mL Intravenous Q12H  . thiamine  100 mg Oral Daily   Continuous Infusions: . 0.9 % NaCl with KCl 40 mEq / L 50 mL/hr (06/01/15 0458)    Principal Problem:   Acute encephalopathy Active Problems:   Hyponatremia   Hypokalemia   Dehydration   Macrocytic anemia   Alcoholic cirrhosis   Thrombocytopenia   Elevated troponin   ETOH abuse   Compression fracture  of L1 lumbar vertebra   Sepsis due to Gram negative bacteria   E-coli UTI   Coagulopathy   Aspiration pneumonia    Time spent: 35 minutes    [place] [place] [facility] Pager [phone]. If 7PM-7AM, please contact night-coverage at [email or web address], password [facility] 06/01/2015, 3:29 PM  LOS: 7 days

## 2015-06-01 NOTE — Care Management Note (Signed)
Case Management Note  Patient Details  Name: [name] MRN: [id number] Date of Birth: 1955/12/29  Subjective/Objective:                  Pt admitted from home with encephalopathy. Pt lives with her husband and will return home at discharge. Pt had been independent with ADL's. Pt has no PCP.  Action/Plan: PT recommends SNF but pt has no insurance and is not able to afford private pay. Pts husband and pts daughter will take pt home with [facility] RN, PT, aide, and CSW. Pt also needs w/c and BSC. Alroy Bailiff of [facility] is aware of pts referral and self pay status. Pt approved for charity with [facility] for [facility] and DME. Pt now being evaluated for possible DVT. Pt also arranged follow up appt with [facility]. Anticipate discharge within 24 hours.  Expected Discharge Date:                  Expected Discharge Plan:  Home w Home Health Services  In-House Referral:  Clinical Social Work, Museum/gallery exhibitions officer  CM Consult  Post Acute Care Choice:  Horticulturist, commercial, Home Health Choice offered to:  Patient, Spouse, Adult Children  DME Arranged:  Government social research officer, Bedside commode DME Agency:  [facility]  HH Arranged:  RN, PT, Nurse's Aide, Social Work [employer] Agency:  [facility]  Status of Service:  Completed, signed off  Medicare Important Message Given:    Date Medicare IM Given:    Medicare IM give by:    Date Additional Medicare IM Given:    Additional Medicare Important Message give by:     If discussed at Long Length of Stay Meetings, dates discussed:    Additional Comments:  Cheryl Flash, RN 06/01/2015, 3:06 PM

## 2015-06-01 NOTE — Care Management Note (Signed)
Case Management Note  Patient Details  Name: [name] MRN: [id number] Date of Birth: 02/17/1956  Subjective/Objective:                    Action/Plan:   Expected Discharge Date:                  Expected Discharge Plan:  Home w Home Health Services  In-House Referral:  Clinical Social Work, Museum/gallery exhibitions officer  CM Consult  Post Acute Care Choice:  Horticulturist, commercial, Home Health Choice offered to:  Patient, Spouse, Adult Children  DME Arranged:  Government social research officer, Bedside commode DME Agency:  [facility]  HH Arranged:  RN, PT, Nurse's Aide, Social Work [employer] Agency:  [facility]  Status of Service:  Completed, signed off  Medicare Important Message Given:    Date Medicare IM Given:    Medicare IM give by:    Date Additional Medicare IM Given:    Additional Medicare Important Message give by:     If discussed at Long Length of Stay Meetings, dates discussed: 06/01/15   Additional Comments:  Cheryl Flash, RN 06/01/2015, 3:22 PM

## 2015-06-01 NOTE — Progress Notes (Signed)
     CONCERNING: IV to Oral Route Change Policy  RECOMMENDATION: This patient is receiving Thiamine and Folic Acid by the intravenous route.  Based on criteria approved by the Pharmacy and Therapeutics Committee, the intravenous medication(s) is/are being converted to the equivalent oral dose form(s).   DESCRIPTION: These criteria include:  The patient is eating (either orally or via tube) and/or has been taking other orally administered medications for a least 24 hours  The patient has no evidence of active gastrointestinal bleeding or impaired GI absorption (gastrectomy, short bowel, patient on TNA or NPO).  If you have questions about this conversion, please contact the Pharmacy Department    [phone] )  [facility]   [phone] )  [facility]   [phone] )  [facility]   [phone] )  [facility]   [phone] )  [facility]   Raquel James, Person [place], [facility] 06/01/2015 11:06 AM

## 2015-06-01 NOTE — Progress Notes (Signed)
Physical Therapy Treatment Patient Details Name: [name] MRN: [id number] DOB: [birth year] Today's Date: 06/01/2015    History of Present Illness Per report pt has been very ill over the last 4-5 days. She has eaten little to nothing over this period of time. She's had multiple daily bouts of nonbloody emesis and diarrhea, which is getting worse.. This has been associated with complaint of stomach pain and per patient's daughter she says that the patient's abdomen is tender to palpation. Unsure of patient's last alcoholic drink, though at baseline she drinks very heavily. Of note over the last 3 weeks or so patient has been seen in [place] for lower extremity leg wounds for which she was receiving antibiotics. Patient's daughter states that she's been taking the antibiotics sporadically and was unsure of what her last dose was. She is also been fairly weak in general and has fallen multiple times over this period of time per family.    PT Comments    Pt was very lethargic initially upon my visit but was slowly able to awaken.  She was aware that she was in a hospital today.  She remains very weak all over but was able to participate in LE strengthening exercise.  She did require max assist to transfer to sitting and standing but was able to slowly turn to the chair using the walker with continual guidance.  She should be able to regain the ability to ambulate, at least short distances.  Follow Up Recommendations  Home health PT (coverage for SNF has been denied)     Equipment Recommendations  Wheelchair (measurements PT);3in1 (PT)    Recommendations for Other Services  OT     Precautions / Restrictions Precautions Precautions: Fall Restrictions Weight Bearing Restrictions: No    Mobility  Bed Mobility Overal bed mobility: Needs Assistance Bed Mobility: Sidelying to Sit   Sidelying to sit: Max assist;HOB elevated          Transfers Overall transfer level: Needs  assistance Equipment used: Rolling walker (2 wheeled) Transfers: Sit to/from [employer] Sit to Stand: Max assist Stand pivot transfers: Min assist       General transfer comment: once pt was standing she was able to use the walker to slowly turn to a chair  Ambulation/Gait             General Gait Details: unable to ambulate   Stairs            Wheelchair Mobility    Modified Rankin (Stroke Patients Only)       Balance   Sitting-balance support: No upper extremity supported;Feet supported Sitting balance-Leahy Scale: Good     Standing balance support: Bilateral upper extremity supported Standing balance-Leahy Scale: Fair                      Cognition Arousal/Alertness: Lethargic Behavior During Therapy: WFL for tasks assessed/performed Overall Cognitive Status: No family/caregiver present to determine baseline cognitive functioning (pt was aware that she was in a hospital today)                      Exercises General Exercises - Lower Extremity Ankle Circles/Pumps: AROM;AAROM;Both;10 reps;Supine Short Arc Quad: AAROM;Both;10 reps;Supine Heel Slides: AAROM;Both;10 reps;Supine Hip ABduction/ADduction: AAROM;Both;10 reps;Supine    General Comments        Pertinent Vitals/Pain Pain Assessment: No/denies pain    Home Living  Prior Function            PT Goals (current goals can now be found in the care plan section) Progress towards PT goals: Progressing toward goals    Frequency  Min 3X/week    PT Plan Current plan remains appropriate;Discharge plan needs to be updated    Co-evaluation             End of Session Equipment Utilized During Treatment: Gait belt Activity Tolerance: Patient tolerated treatment well Patient left: in chair;with call bell/phone within reach;with chair alarm set     Time: 1610-9604 PT Time Calculation (min) (ACUTE ONLY): 37 min  Charges:   $Therapeutic Exercise: 8-22 mins $Therapeutic Activity: 8-22 mins                    G CodesKonrad Penta  PT 06/01/2015, 12:14 PM [phone]

## 2015-06-02 LAB — BASIC METABOLIC PANEL
ANION GAP: 5 (ref 5–15)
BUN: 21 mg/dL — ABNORMAL HIGH (ref 6–20)
CALCIUM: 7.1 mg/dL — AB (ref 8.9–10.3)
CO2: 21 mmol/L — AB (ref 22–32)
Chloride: 110 mmol/L (ref 101–111)
Creatinine, Ser: 0.55 mg/dL (ref 0.44–1.00)
Glucose, Bld: 114 mg/dL — ABNORMAL HIGH (ref 65–99)
POTASSIUM: 5 mmol/L (ref 3.5–5.1)
Sodium: 136 mmol/L (ref 135–145)

## 2015-06-02 LAB — CBC
HEMATOCRIT: 25.4 % — AB (ref 36.0–46.0)
Hemoglobin: 8.2 g/dL — ABNORMAL LOW (ref 12.0–15.0)
MCH: 41.8 pg — ABNORMAL HIGH (ref 26.0–34.0)
MCHC: 32.3 g/dL (ref 30.0–36.0)
MCV: 129.6 fL — ABNORMAL HIGH (ref 78.0–100.0)
Platelets: 107 10*3/uL — ABNORMAL LOW (ref 150–400)
RBC: 1.96 MIL/uL — AB (ref 3.87–5.11)
RDW: 17 % — AB (ref 11.5–15.5)
WBC: 17.8 10*3/uL — AB (ref 4.0–10.5)

## 2015-06-02 LAB — PROTIME-INR
INR: 1.35 (ref 0.00–1.49)
Prothrombin Time: 16.8 seconds — ABNORMAL HIGH (ref 11.6–15.2)

## 2015-06-02 MED ORDER — LEVOFLOXACIN 750 MG PO TABS: 750.0000 mg | ORAL_TABLET | Freq: Every day | ORAL | Status: DC

## 2015-06-02 MED ORDER — ALBUTEROL SULFATE HFA 108 (90 BASE) MCG/ACT IN AERS: 1.0000 | INHALATION_SPRAY | Freq: Four times a day (QID) | RESPIRATORY_TRACT | Status: AC | PRN

## 2015-06-02 MED ORDER — THIAMINE HCL 100 MG PO TABS: 100.0000 mg | ORAL_TABLET | Freq: Every day | ORAL | Status: DC

## 2015-06-02 MED ORDER — OXYCODONE HCL 5 MG PO TABS: 5.0000 mg | ORAL_TABLET | Freq: Four times a day (QID) | ORAL | Status: DC | PRN

## 2015-06-02 MED ORDER — PANTOPRAZOLE SODIUM 40 MG PO TBEC: 40.0000 mg | DELAYED_RELEASE_TABLET | Freq: Every day | ORAL | Status: DC

## 2015-06-02 MED ORDER — NICOTINE 21 MG/24HR TD PT24: 21.0000 mg | MEDICATED_PATCH | Freq: Every day | TRANSDERMAL | Status: DC

## 2015-06-02 MED ORDER — PREDNISONE 10 MG PO TABS: ORAL_TABLET | ORAL | Status: DC

## 2015-06-02 MED ORDER — FOLIC ACID 1 MG PO TABS: 1.0000 mg | ORAL_TABLET | Freq: Every day | ORAL | Status: DC

## 2015-06-02 MED ORDER — SACCHAROMYCES BOULARDII 250 MG PO CAPS: 250.0000 mg | ORAL_CAPSULE | Freq: Two times a day (BID) | ORAL | Status: DC

## 2015-06-02 MED ORDER — METOPROLOL SUCCINATE ER 25 MG PO TB24: 25.0000 mg | ORAL_TABLET | Freq: Every day | ORAL | Status: DC

## 2015-06-02 MED ORDER — ENSURE ENLIVE PO LIQD: 237.0000 mL | Freq: Two times a day (BID) | ORAL | Status: DC

## 2015-06-02 NOTE — Progress Notes (Signed)
Pt IVs removed, pt tolerated well. Reviewed discharge instructions with pt and family, answered all questions at this time.

## 2015-06-02 NOTE — Care Management Note (Signed)
Case Management Note  Patient Details  Name: [name] MRN: [id number] Date of Birth: 22-Dec-1955  Subjective/Objective:                    Action/Plan:   Expected Discharge Date:                  Expected Discharge Plan:  Home w Home Health Services  In-House Referral:  Clinical Social Work, Artist  Discharge planning Services  CM Consult, MATCH Program  Post Acute Care Choice:  Horticulturist, commercial, Home Health Choice offered to:  Patient, Spouse, Adult Children  DME Arranged:  Government social research officer, Bedside commode, Hospital bed DME Agency:  [facility]  HH Arranged:  RN, PT, Nurse's Aide, Social Work [employer] Agency:  [facility]  Status of Service:  Completed, signed off  Medicare Important Message Given:    Date Medicare IM Given:    Medicare IM give by:    Date Additional Medicare IM Given:    Additional Medicare Important Message give by:     If discussed at Long Length of Stay Meetings, dates discussed:    Additional Comments: Pt discharged home today with [facility] RN, PT, aide, and CSW at discharge (pt has no insurance). Alroy Bailiff of [facility] is aware and will collect the pts information from the chart. HH services to start within 48 hours of discharge. Pt also has hospital bed, w/c, and BSC arranged with [facility] who will deliver pts DME after discharge. MATCH voucher given for assistance with discharge medications. Pt and pts nurse aware of discharge arrangements. Arlyss [place] Beatty, RN 06/02/2015, 1:27 PM

## 2015-06-02 NOTE — Discharge Summary (Signed)
Physician Discharge Summary  [name] LKG:[id number] DOB: Dec 15, 1955 DOA: 05/25/2015  PCP: Dionisio Paschal, NP  Admit date: 05/25/2015 Discharge date: 06/02/2015  Time spent: 40 minutes  Recommendations for Outpatient Follow-up:  1. Follow up [facility] as scheduled 06/08/15 for evaluation of encephalopathy, resolution of UTI and pneumonia and ETOH abuse. Recommend BMET and CBC.  2. Follow up with GI 06/16/15 for evaluation of anemia (guac +stool) and cirrhosis 3. Home health PT/OT/RN/SW  Discharge Diagnoses:  Principal Problem:   Acute encephalopathy Active Problems:   Hyponatremia   Hypokalemia   Dehydration   Macrocytic anemia   Alcoholic cirrhosis   Thrombocytopenia   Elevated troponin   ETOH abuse   Compression fracture of L1 lumbar vertebra   Sepsis due to Gram negative bacteria   E-coli UTI   Coagulopathy   Aspiration pneumonia   Discharge Condition: stable  Diet recommendation: heart heathy dys 3 thin liquid  Filed Weights   05/25/15 2101 05/26/15 0500 05/27/15 0500  Weight: 62.8 kg (138 lb 7.2 oz) 65.5 kg (144 lb 6.4 oz) 67.5 kg (148 lb 13 oz)    History of present illness:   Patient presented to ED 05/25/15 with cc AMS. Per report pt had been very ill over the previous 4-5 days. She had eaten little to nothing over this period of time. She'd had multiple daily bouts of nonbloody emesis and diarrhea, which was getting worse. This had been associated with complaint of stomach pain and per patient's daughter she reported that the patient's abdomen was tender to palpation. Unsure of patient's last alcoholic drink before presentation, though at baseline she drinks very heavily. Of note over the previous 3 weeks or so patient had been seen in [place] for lower extremity leg wounds for which she was receiving antibiotics. Patient's daughter stated that she'd been taking the antibiotics sporadically and was unsure of what her last dose was. She had been fairly weak in  general and had fallen multiple times over this period of time per family.  Patient was unaware of why she was in the hospital other than that she states she feels weak and dizzy and has fallen a time or 2. She was alert and oriented to person and place only  Stated her last alcohol beverage was on 05/21/2015.  Hospital Course:  Acute Encephalopathy: likely multifactorial - alcohol withdrawal, infection, severe dehydration hepatic encephalopathy. Vit B12 and folate within normal limits. Ammonia level with in limits of normal 05/30/15. CT abdomen pelvis without contrast (AKI) shows new compression fx and possible pna. 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 Blood cultures with gram negative rods provided with  Zosyn and vancomycin which was transitioned to Levaquin. She will be discharge with 5 more days Levaquin. Evaluated by PT who recommend snf but patient and family wish to go home. Home health support arranged. At discharge patient alert oriented to self and place. Calm cooperative asking meaningful questions about care/equipment at discharge. Follow up with PCP 06/08/15.  sepsis due to gram negative bacteremia: per blood culture. Likely related to UTI. At discharge she has been afebrile and non-toxic appearing for 48 hours. She does have stable leukocytosis which is likely related to steroid which will be tapered quickly. Anti-biotics as above.    UTI: culture with E Coli. Urine culture pan sensitive. Antibiotics as above. Consider OP urinalysis to ensure resolution   Probable aspiration pneumonia: per chest xray 05/29/15. Antibiotics as noted. Also provided with steroids and  nebs. Oxygen saturation level 99% on room air at discharge. Will discharge with quick prednisone taper. At discharge she is afebrile and non-toxic appearing.  Compression fracture L1: complained low back pain. Evaluated by PT and needs assist but able to bear weight. Doubt candidate for  kyphoplasty. Of note PT recommended snf but patient and family declined  Cirrhosis: ETOH abuse. Chart review indicates hepatitis panel from 03/2015 nml. On admission MELD score 34 (56% mortality in 3 mo) - slightly scewed due to dehydration/AKI. AST trending up ALT/ALK Phos within limits of normal, total bilie stable 5.7 up from 2 months ago.has follow up appointment with GI 06/16/15.    AKI: Resolved. Baseline 0.9. Urine output good.   Thrombocytopenia: likely hepatic etiology given ETOH abuse hx. S/p Vitamin K x 3days.  Stable at discharge. No s/sx bleeding. Has GI appointment 06/16/15. Has PCP appointment 06/08/15  Hyponatremia/Hypokalemia: hyponatremia resolved. Hypokalemia resolved.    Elevated Troponin: 0.17 on admission and stable x3. Initial EKG w/o ACS. Repeat EKG ws unchanged. Likely related to demand ischemia in setting of #2. Echo revealed wall thickness wasincreased in a pattern of mild LVH. Systolic function wasvigorous. The estimated ejection fraction was in the range of 70%to 75%. Wall motion was normal; there were no regional wallmotion abnormalities. Doppler parameters consistent withabnormal left ventricular relaxation (grade 1 diastolicdysfunction). No chest pain.   Macrocytic Anemia: Hgb 8.2 at discharge. Baseline appeared to be 12. guac + stool.  Likely related to malnutrition and Cirrhosis. Anemia panel with iron 102, TIBC 112.  Folate and B12 within limits of normal. No evidence of bleeding. S/p vitamin K x3 related to reported melena.  ETOH abuse: chronic condition for pt. Last ETOH on 05/21/15 per pt . ETOH level nml. Managed with  CIWA protocol. Will be discharged with thiamine.  Counseled to stop drinking. Provided with  protonix which will be continued at discharge. Has appointment with GI 06/16/15 for OP follow up.    Hypocalcemia: chronic and stable at discharge.  Likely related to above.   Chronic debilitation/failure to thrive: progressing over previous  several months related to ongoing ETOH abuse and decreased oral intake. Pt evaluated and recommended snf. [facility]  Left lower extremity edema: Venous Doppler US negative for DVT. Of note, she is chronically self anticoagulated secondary to liver disease.   Procedures:  2-D echocardiogram 05/26/15:Study Conclusions - Left ventricle: The cavity size was normal. Wall thickness was increased in a pattern of mild LVH. Systolic function was vigorous. The estimated ejection fraction was in the range of 70% to 75%. Wall motion was normal; there were no regional wall motion abnormalities. Doppler parameters are consistent with abnormal left ventricular relaxation (grade 1 diastolic dysfunction). - Mitral valve: Calcified annulus. There was trivial regurgitation. - Right atrium: Central venous pressure (est): 3 mm Hg. - Tricuspid valve: There was trivial regurgitation. - Pulmonary arteries: Systolic pressure could not be accurately estimated. - Pericardium, extracardiac: A probable, small pericardial effusion was identified anterior to the heart. There was a left pleural effusion. Impressions: - Mild LVH with LVEF 70-75%, no wall motion abnormalities, and grade 1 diastolic dysfunction. Trivial mitral and tricuspid regurgitation. Probable small anterior pericardial effusion as well as potential small left pleural effusion.  Consultations:  none  Discharge Exam: Filed Vitals:   06/02/15 0655  BP: 165/87  Pulse: 85  Temp: 97.6 F (36.4 C)  Resp: 18    General: alert oriented appears comfortable Cardiovascular: rrr no MGR trace le edema  on left PPP Respiratory: normal effort improved air movement. No wheeze or crackles  Discharge Instructions    Current Discharge Medication List    START taking these medications   Details  feeding supplement, ENSURE ENLIVE, (ENSURE ENLIVE) LIQD Take 237 mLs by mouth 2 (two) times daily between meals.     folic acid (FOLVITE) 1 MG tablet Take 1 tablet (1 mg total) by mouth daily. Qty: 30 tablet, Refills: 1    levofloxacin (LEVAQUIN) 750 MG tablet Take 1 tablet (750 mg total) by mouth daily. Qty: 5 tablet, Refills: 0    metoprolol succinate (TOPROL-XL) 25 MG 24 hr tablet Take 1 tablet (25 mg total) by mouth daily. Qty: 30 tablet, Refills: 1    nicotine (NICODERM CQ - DOSED IN MG/24 HOURS) 21 mg/24hr patch Place 1 patch (21 mg total) onto the skin daily.    oxyCODONE (OXY IR/ROXICODONE) 5 MG immediate release tablet Take 1 tablet (5 mg total) by mouth every 6 (six) hours as needed for moderate pain. Qty: 15 tablet, Refills: 0    pantoprazole (PROTONIX) 40 MG tablet Take 1 tablet (40 mg total) by mouth daily with supper. Qty: 30 tablet, Refills: 1    predniSONE (DELTASONE) 10 MG tablet Take 4 tabs on 06/03/15 then take 3 tabs 9/23 then take 2 tabs 9/24 then take 1 tab 9/25 then stop Qty: 10 tablet, Refills: 0    saccharomyces boulardii (FLORASTOR) 250 MG capsule Take 1 capsule (250 mg total) by mouth 2 (two) times daily. Qty: 60 capsule, Refills: 0    thiamine 100 MG tablet Take 1 tablet (100 mg total) by mouth daily. Qty: 30 tablet, Refills: 2      CONTINUE these medications which have CHANGED   Details  albuterol (PROVENTIL HFA;VENTOLIN HFA) 108 (90 BASE) MCG/ACT inhaler Inhale 1-2 puffs into the lungs every 6 (six) hours as needed for wheezing. Qty: 1 Inhaler, Refills: 0       Allergies  Allergen Reactions  . Loratadine Rash   Follow-up Information    Follow up with [facility] On 06/08/2015.   Why:  at 1:30   Contact information:   [place] [address] [phone]       Follow up with [facility].   Contact information:   [address] [place] [address] [phone]        The results of significant diagnostics from this hospitalization (including imaging, microbiology, ancillary and laboratory)  are listed below for reference.    Significant Diagnostic Studies: Ct Abdomen Pelvis Wo Contrast  05/25/2015   CLINICAL DATA:  5-YEAR-OLD FEMALE WITH SEPSIS  EXAM: CT ABDOMEN AND PELVIS WITHOUT CONTRAST  TECHNIQUE: Multidetector CT imaging of the abdomen and pelvis was performed following the standard protocol without IV contrast.  COMPARISON:  CT DATED 07/22/2013  FINDINGS: Evaluation of this exam is limited in the absence of intravenous contrast. Evaluation is also limited due to streak artifact caused by patient's arms.  Bibasilar dependent subsegmental atelectasis/scarring. There is aortoiliac atherosclerotic disease. No intra-abdominal free air or free fluid.  Hepatomegaly with diffuse fatty liver. Subcentimeter right hepatic hypodense lesions is not characterized. The gallbladder is filled with stones. No pericholecystic fluid noted. Pancreas, spleen, adrenal glands appear unremarkable. There is no hydronephrosis or nephrolithiasis on either side. There is a grossly stable left renal inferior pole cyst. A 1.3 x 1.2 cm high attenuating lesions arising from the inferior pole of the left kidney appears grossly similar  to prior study and not characterized on this noncontrast CT. Follow-up with ultrasound or MRI recommended for further characterization. There are mild bilateral perinephric stranding. Correlation with urinalysis recommended to exclude UTI. The urinary bladder is distended. A small calcified uterine fibroid noted.  Sigmoid diverticulosis with muscular hypertrophy. No active inflammation. There is submucosal fat deposit in the cecum and ascending colon likely sequela of chronic inflammation. This appearance is similar to the prior study. No acute inflammatory changes identified. There is no evidence of bowel obstruction.  There is aortoiliac atherosclerotic disease. There is heavy calcification of the origin of the left renal artery. No portal venous gas identified. There is no adenopathy.  There  is mild stranding of the subcutaneous soft tissues of the abdominal pelvic wall. Mild degenerative changes of the spine. There is compression fracture of the L1 vertebra with extension of the fracture through the superior and inferior endplates. This fracture is new compared to the prior study and appears to be acute or subacute. Clinical correlation is recommended. No retropulsed fragment identified.  IMPRESSION: L1 compression fracture, new from prior study and likely acute. Clinical correlation is recommended. No retropulsed fragment identified.  Bibasilar subsegmental atelectasis/ scarring. Pneumonia is not excluded.  Cholelithiasis.  No hydronephrosis or nephrolithiasis. Correlation with urinalysis recommended to exclude UTI.  Sigmoid diverticulosis. No evidence of bowel obstruction or inflammation.   Electronically Signed   By: Anner Crete M.D.   On: 05/25/2015 20:49   Ct Head Wo Contrast  05/25/2015   CLINICAL DATA:  Altered mental status.  Shortness of breath.  EXAM: CT HEAD WITHOUT CONTRAST  TECHNIQUE: Contiguous axial images were obtained from the base of the skull through the vertex without intravenous contrast.  COMPARISON:  03/24/2010  FINDINGS: Old infarct with encephalomalacia in the left frontal lobe. Stable hypodensities throughout the deep white matter compatible with chronic small vessel disease. No acute infarct. No hemorrhage or hydrocephalus. No mass lesion or midline shift.  Visualized paranasal sinuses and mastoids clear. Orbital soft tissues unremarkable.  IMPRESSION: Old left frontal infarct.  Chronic small vessel disease.  No acute intracranial abnormality.   Electronically Signed   By: Rolm Baptise M.D.   On: 05/25/2015 16:34   US Venous Img Lower Unilateral Left  06/01/2015   CLINICAL DATA:  Left lower extremity swelling, pain  EXAM: LEFT LOWER EXTREMITY VENOUS DOPPLER ULTRASOUND  TECHNIQUE: Gray-scale sonography with compression, as well as color and duplex ultrasound, were  performed to evaluate the deep venous system from the level of the common femoral vein through the popliteal and proximal calf veins.  COMPARISON:  None  FINDINGS: Normal compressibility of the common femoral, superficial femoral, and popliteal veins, as well as the proximal calf veins. No filling defects to suggest DVT on grayscale or color Doppler imaging. Doppler waveforms show normal direction of venous flow, normal respiratory phasicity and response to augmentation. Survey views of the contralateral common femoral vein are unremarkable.  IMPRESSION: 1. No evidence of lower extremity deep vein thrombosis, LEFT.   Electronically Signed   By: Lucrezia Europe M.D.   On: 06/01/2015 17:29   Dg Chest Port 1 View  05/29/2015   CLINICAL DATA:  Rhonchi, alcohol abuse  EXAM: PORTABLE CHEST - 1 VIEW  COMPARISON:  05/26/2015  FINDINGS: Bilateral lower lobe airspace disease, left worse than right. Patchy areas of airspace disease in the upper lobes bilaterally. No pneumothorax. Small left pleural effusion. Stable cardiomediastinal silhouette. No acute osseous abnormality.  IMPRESSION: Bilateral areas of airspace  disease most concerning for multilobar pneumonia.   Electronically Signed   By: Kathreen Devoid   On: 05/29/2015 13:21   Dg Chest Port 1 View  05/26/2015   CLINICAL DATA:  Shortness of breath.  Pneumonia.  Fever.  EXAM: PORTABLE CHEST - 1 VIEW  COMPARISON:  05/25/2015 and 03/21/2015  FINDINGS: The patient has developed dense consolidation at the left lung base. There may be an adjacent small effusion as well. Right lung is clear. Heart size and vascularity remain normal. No acute osseous abnormality.  IMPRESSION: Interval consolidation of the left lower lobe consistent with pneumonia. There may be an adjacent small effusion.   Electronically Signed   By: Lorriane Shire M.D.   On: 05/26/2015 12:08   Dg Chest Port 1 View  05/25/2015   CLINICAL DATA:  Short of breath. Severe abdominal pain and shortness of breath.   EXAM: PORTABLE CHEST - 1 VIEW  COMPARISON:  03/21/2015.  FINDINGS: Low volume chest. This accentuates the size of the cardiopericardial silhouette which is probably within normal limits. Bilateral basilar opacity probably represents atelectasis based on the low lung volumes. No gross focal consolidation. Monitoring leads project over the chest.  IMPRESSION: Low volume chest.   Electronically Signed   By: Dereck Ligas M.D.   On: 05/25/2015 15:27    Microbiology: Recent Results (from the past 240 hour(s))  Urine culture     Status: None   Collection Time: 05/25/15  3:12 PM  Result Value Ref Range Status   Specimen Description URINE, CATHETERIZED  Final   Special Requests NONE  Final   Culture   Final    80,000 COLONIES/ml ESCHERICHIA COLI Performed at [facility]    Report Status 05/29/2015 FINAL  Final   Organism ID, Bacteria ESCHERICHIA COLI  Final      Susceptibility   Escherichia coli - MIC*    AMPICILLIN <=2 SENSITIVE Sensitive     CEFAZOLIN <=4 SENSITIVE Sensitive     CEFTRIAXONE <=1 SENSITIVE Sensitive     CIPROFLOXACIN <=0.25 SENSITIVE Sensitive     GENTAMICIN <=1 SENSITIVE Sensitive     IMIPENEM <=0.25 SENSITIVE Sensitive     NITROFURANTOIN <=16 SENSITIVE Sensitive     TRIMETH/SULFA <=20 SENSITIVE Sensitive     AMPICILLIN/SULBACTAM <=2 SENSITIVE Sensitive     PIP/TAZO <=4 SENSITIVE Sensitive     * 80,000 COLONIES/ml ESCHERICHIA COLI  Blood Culture (routine x 2)     Status: None   Collection Time: 05/25/15  3:30 PM  Result Value Ref Range Status   Specimen Description BLOOD LEFT ARM  Final   Special Requests BOTTLES DRAWN AEROBIC AND ANAEROBIC 8CC  Final   Culture  Setup Time   Final    GRAM NEGATIVE RODS RECOVERED FROM BOTH BOTTLES Gram Stain Report Called to,Read Back By and Verified With: HEARN,J. AT 0615 ON 05/26/2015 BY RESSEGGER,R. Performed at [facility]    Culture   Final    ESCHERICHIA COLI Performed at [facility]    Report Status  05/28/2015 FINAL  Final   Organism ID, Bacteria ESCHERICHIA COLI  Final      Susceptibility   Escherichia coli - MIC*    AMPICILLIN 4 SENSITIVE Sensitive     CEFAZOLIN <=4 SENSITIVE Sensitive     CEFEPIME <=1 SENSITIVE Sensitive     CEFTAZIDIME <=1 SENSITIVE Sensitive     CEFTRIAXONE <=1 SENSITIVE Sensitive     CIPROFLOXACIN <=0.25 SENSITIVE Sensitive     GENTAMICIN <=1 SENSITIVE Sensitive  IMIPENEM <=0.25 SENSITIVE Sensitive     TRIMETH/SULFA <=20 SENSITIVE Sensitive     AMPICILLIN/SULBACTAM <=2 SENSITIVE Sensitive     PIP/TAZO <=4 SENSITIVE Sensitive     * ESCHERICHIA COLI  Blood Culture (routine x 2)     Status: None   Collection Time: 05/25/15  3:38 PM  Result Value Ref Range Status   Specimen Description BLOOD RIGHT HAND  Final   Special Requests BOTTLES DRAWN AEROBIC AND ANAEROBIC 5CC  Final   Culture  Setup Time   Final    GRAM NEGATIVE RODS RECOVERED FROM BOTH BOTTLES Gram Stain Report Called to,Read Back By and Verified With: PHILLIPS,C. AT 0800 ON 05/26/2015 BY RESSEGGER,R. Performed at [facility]    Culture   Final    ESCHERICHIA COLI SUSCEPTIBILITIES PERFORMED ON PREVIOUS CULTURE WITHIN THE LAST 5 DAYS. Performed at [facility]    Report Status 05/28/2015 FINAL  Final  MRSA PCR Screening     Status: None   Collection Time: 05/25/15  8:40 PM  Result Value Ref Range Status   MRSA by PCR NEGATIVE NEGATIVE Final    Comment:        The GeneXpert MRSA Assay (FDA approved for NASAL specimens only), is one component of a comprehensive MRSA colonization surveillance program. It is not intended to diagnose MRSA infection nor to guide or monitor treatment for MRSA infections.   C difficile quick scan w PCR reflex     Status: Abnormal   Collection Time: 05/30/15  5:20 PM  Result Value Ref Range Status   C Diff antigen POSITIVE (A) NEGATIVE Final    Comment: RESULT CALLED TO, READ BACK BY AND VERIFIED WITH: JOHNSON,B AT 1934 ON 05/30/15 BY  MOSLEY,J    C Diff toxin NEGATIVE NEGATIVE Final   C Diff interpretation   Final    C. difficile present, but toxin not detected. This indicates colonization. In most cases, this does not require treatment. If patient has signs and symptoms consistent with colitis, consider treatment.     Labs: Basic Metabolic Panel:  Recent Labs Lab 05/28/15 0737 05/30/15 0554 05/31/15 1205 06/01/15 0628 06/02/15 0615  NA 139 140 138 137 136  K 3.1* 4.1 4.4 4.4 5.0  CL 111 112* 111 111 110  CO2 21* 21* 20* 22 21*  GLUCOSE 127* 139* 158* 128* 114*  BUN 42* 22* 24* 24* 21*  CREATININE 0.76 0.55 0.58 0.65 0.55  CALCIUM 7.1* 7.4* 7.1* 7.1* 7.1*  MG  --  2.0  --   --   --    Liver Function Tests:  Recent Labs Lab 05/27/15 0421 05/31/15 1205  AST 193* 172*  ALT 34 69*  ALKPHOS 53 145*  BILITOT 5.7* 4.8*  PROT 5.5* 7.2  ALBUMIN 1.6* 1.9*   No results for input(s): LIPASE, AMYLASE in the last 168 hours.  Recent Labs Lab 05/30/15 0554  AMMONIA 31   CBC:  Recent Labs Lab 05/27/15 0421  05/28/15 0737 05/30/15 0554 05/31/15 1205 06/01/15 0628 06/02/15 0615  WBC 4.4  --  6.8 12.7* 17.2* 16.6* 17.8*  NEUTROABS 3.1  --   --   --   --   --   --   HGB 8.0*  --  8.5* 7.9* 8.3* 7.8* 8.2*  HCT 24.4*  < > 25.0* 23.5* 25.0* 23.0* 25.4*  MCV 131.9*  --  129.5* 128.4* 128.2* 127.8* 129.6*  PLT 36*  --  33* 58* 93* 83* 107*  < > = values  in this interval not displayed. Cardiac Enzymes: No results for input(s): CKTOTAL, CKMB, CKMBINDEX, TROPONINI in the last 168 hours. BNP: BNP (last 3 results)  Recent Labs  03/21/15 2209  BNP 53.0    ProBNP (last 3 results) No results for input(s): PROBNP in the last 8760 hours.  CBG: No results for input(s): GLUCAP in the last 168 hours.     SignedRadene Gunning  [facility] 06/02/2015, 10:57 AM

## 2015-06-16 ENCOUNTER — Ambulatory Visit: Payer: Self-pay | Admitting: Nurse Practitioner

## 2015-06-16 ENCOUNTER — Encounter: Payer: Self-pay | Admitting: Nurse Practitioner

## 2015-06-16 ENCOUNTER — Telehealth: Payer: Self-pay | Admitting: Nurse Practitioner

## 2015-06-16 NOTE — Telephone Encounter (Signed)
PATIENT WAS A NO SHOW AND LETTER SENT  °

## 2015-06-16 NOTE — Telephone Encounter (Signed)
Noted  

## 2015-06-25 ENCOUNTER — Emergency Department (HOSPITAL_COMMUNITY): Payer: Medicaid Other

## 2015-06-25 ENCOUNTER — Encounter (HOSPITAL_COMMUNITY): Payer: Self-pay | Admitting: *Deleted

## 2015-06-25 ENCOUNTER — Inpatient Hospital Stay (HOSPITAL_COMMUNITY)
Admission: EM | Admit: 2015-06-25 | Discharge: 2015-06-27 | DRG: 872 | Disposition: A | Discharge status: Home / Self Care | Payer: Medicaid Other | Attending: Family Medicine | Admitting: Family Medicine

## 2015-06-25 DIAGNOSIS — N390 Urinary tract infection, site not specified: Secondary | ICD-10-CM

## 2015-06-25 DIAGNOSIS — I10 Essential (primary) hypertension: Secondary | ICD-10-CM | POA: Diagnosis present

## 2015-06-25 DIAGNOSIS — K7030 Alcoholic cirrhosis of liver without ascites: Secondary | ICD-10-CM | POA: Diagnosis present

## 2015-06-25 DIAGNOSIS — R509 Fever, unspecified: Secondary | ICD-10-CM

## 2015-06-25 DIAGNOSIS — K760 Fatty (change of) liver, not elsewhere classified: Secondary | ICD-10-CM | POA: Diagnosis present

## 2015-06-25 DIAGNOSIS — K7689 Other specified diseases of liver: Secondary | ICD-10-CM

## 2015-06-25 DIAGNOSIS — Z806 Family history of leukemia: Secondary | ICD-10-CM

## 2015-06-25 DIAGNOSIS — F1097 Alcohol use, unspecified with alcohol-induced persisting dementia: Secondary | ICD-10-CM | POA: Diagnosis present

## 2015-06-25 DIAGNOSIS — Z8673 Personal history of transient ischemic attack (TIA), and cerebral infarction without residual deficits: Secondary | ICD-10-CM

## 2015-06-25 DIAGNOSIS — D638 Anemia in other chronic diseases classified elsewhere: Secondary | ICD-10-CM | POA: Diagnosis present

## 2015-06-25 DIAGNOSIS — F17210 Nicotine dependence, cigarettes, uncomplicated: Secondary | ICD-10-CM | POA: Diagnosis present

## 2015-06-25 DIAGNOSIS — Z23 Encounter for immunization: Secondary | ICD-10-CM

## 2015-06-25 DIAGNOSIS — Z9114 Patient's other noncompliance with medication regimen: Secondary | ICD-10-CM

## 2015-06-25 DIAGNOSIS — A419 Sepsis, unspecified organism: Principal | ICD-10-CM | POA: Diagnosis present

## 2015-06-25 DIAGNOSIS — D696 Thrombocytopenia, unspecified: Secondary | ICD-10-CM | POA: Diagnosis present

## 2015-06-25 DIAGNOSIS — K219 Gastro-esophageal reflux disease without esophagitis: Secondary | ICD-10-CM | POA: Diagnosis present

## 2015-06-25 DIAGNOSIS — F1010 Alcohol abuse, uncomplicated: Secondary | ICD-10-CM | POA: Diagnosis present

## 2015-06-25 HISTORY — DX: Wedge compression fracture of unspecified lumbar vertebra, initial encounter for closed fracture: S32.000A

## 2015-06-25 HISTORY — DX: Anemia in other chronic diseases classified elsewhere: D63.8

## 2015-06-25 HISTORY — DX: Alcohol dependence with alcohol-induced persisting dementia: F10.27

## 2015-06-25 HISTORY — DX: Alcoholic cirrhosis of liver without ascites: K70.30

## 2015-06-25 HISTORY — DX: Coagulation defect, unspecified: D68.9

## 2015-06-25 HISTORY — DX: Gastrointestinal hemorrhage, unspecified: K92.2

## 2015-06-25 HISTORY — DX: Thrombocytopenia, unspecified: D69.6

## 2015-06-25 LAB — URINALYSIS, ROUTINE W REFLEX MICROSCOPIC
GLUCOSE, UA: NEGATIVE mg/dL
KETONES UR: NEGATIVE mg/dL
Nitrite: NEGATIVE
PROTEIN: 30 mg/dL — AB
Specific Gravity, Urine: 1.02 (ref 1.005–1.030)
UROBILINOGEN UA: 2 mg/dL — AB (ref 0.0–1.0)
pH: 6 (ref 5.0–8.0)

## 2015-06-25 LAB — COMPREHENSIVE METABOLIC PANEL
ALBUMIN: 2.3 g/dL — AB (ref 3.5–5.0)
ALK PHOS: 172 U/L — AB (ref 38–126)
ALT: 15 U/L (ref 14–54)
ANION GAP: 8 (ref 5–15)
AST: 54 U/L — AB (ref 15–41)
BILIRUBIN TOTAL: 1.9 mg/dL — AB (ref 0.3–1.2)
BUN: 8 mg/dL (ref 6–20)
CALCIUM: 8.7 mg/dL — AB (ref 8.9–10.3)
CO2: 23 mmol/L (ref 22–32)
Chloride: 108 mmol/L (ref 101–111)
Creatinine, Ser: 0.8 mg/dL (ref 0.44–1.00)
GFR calc Af Amer: >60 mL/min (ref >60)
GLUCOSE: 108 mg/dL — AB (ref 65–99)
POTASSIUM: 3.9 mmol/L (ref 3.5–5.1)
Sodium: 139 mmol/L (ref 135–145)
TOTAL PROTEIN: 6.9 g/dL (ref 6.5–8.1)

## 2015-06-25 LAB — CBC
HCT: 31.8 % — ABNORMAL LOW (ref 36.0–46.0)
Hemoglobin: 9.9 g/dL — ABNORMAL LOW (ref 12.0–15.0)
MCH: 34.4 pg — ABNORMAL HIGH (ref 26.0–34.0)
MCHC: 31.1 g/dL (ref 30.0–36.0)
MCV: 110.4 fL — ABNORMAL HIGH (ref 78.0–100.0)
Platelets: 130 10*3/uL — ABNORMAL LOW (ref 150–400)
RBC: 2.88 MIL/uL — ABNORMAL LOW (ref 3.87–5.11)
RDW: 15 % (ref 11.5–15.5)
WBC: 8.4 10*3/uL (ref 4.0–10.5)

## 2015-06-25 LAB — PROTIME-INR
INR: 1.43 (ref 0.00–1.49)
PROTHROMBIN TIME: 17.6 s — AB (ref 11.6–15.2)

## 2015-06-25 LAB — ETHANOL: Alcohol, Ethyl (B): <5 mg/dL (ref <5)

## 2015-06-25 LAB — TROPONIN I

## 2015-06-25 LAB — URINE MICROSCOPIC-ADD ON

## 2015-06-25 LAB — LACTIC ACID, PLASMA: LACTIC ACID, VENOUS: 2.2 mmol/L — AB (ref 0.5–2.0)

## 2015-06-25 MED ORDER — SODIUM CHLORIDE 0.9 % IJ SOLN: 3.0000 mL | Freq: Two times a day (BID) | INTRAMUSCULAR | Status: DC

## 2015-06-25 MED ORDER — PANTOPRAZOLE SODIUM 40 MG PO TBEC
40.0000 mg | DELAYED_RELEASE_TABLET | Freq: Every day | ORAL | Status: DC
  Administered 2015-06-25 – 2015-06-26 (×2): 40 mg via ORAL
  Filled 2015-06-25 (×2): qty 1

## 2015-06-25 MED ORDER — SODIUM CHLORIDE 0.9 % IV BOLUS (SEPSIS)
500.0000 mL | Freq: Once | INTRAVENOUS | Status: AC
  Administered 2015-06-25: 500 mL via INTRAVENOUS

## 2015-06-25 MED ORDER — ONDANSETRON HCL 4 MG/2ML IJ SOLN: 4.0000 mg | Freq: Four times a day (QID) | INTRAMUSCULAR | Status: DC | PRN

## 2015-06-25 MED ORDER — DEXTROSE 5 % IV SOLN
1.0000 g | INTRAVENOUS | Status: DC
  Filled 2015-06-25 (×2): qty 10

## 2015-06-25 MED ORDER — FOLIC ACID 1 MG PO TABS
1.0000 mg | ORAL_TABLET | Freq: Every day | ORAL | Status: DC
  Administered 2015-06-25 – 2015-06-27 (×3): 1 mg via ORAL
  Filled 2015-06-25 (×3): qty 1

## 2015-06-25 MED ORDER — DEXTROSE 5 % IV SOLN
1.0000 g | Freq: Once | INTRAVENOUS | Status: AC
  Administered 2015-06-25: 1 g via INTRAVENOUS
  Filled 2015-06-25: qty 10

## 2015-06-25 MED ORDER — SACCHAROMYCES BOULARDII 250 MG PO CAPS
250.0000 mg | ORAL_CAPSULE | Freq: Two times a day (BID) | ORAL | Status: DC
  Administered 2015-06-25 – 2015-06-27 (×4): 250 mg via ORAL
  Filled 2015-06-25 (×4): qty 1

## 2015-06-25 MED ORDER — ALBUTEROL SULFATE (2.5 MG/3ML) 0.083% IN NEBU
3.0000 mL | INHALATION_SOLUTION | Freq: Four times a day (QID) | RESPIRATORY_TRACT | Status: DC | PRN
  Administered 2015-06-25 – 2015-06-26 (×2): 3 mL via RESPIRATORY_TRACT
  Filled 2015-06-25 (×2): qty 3

## 2015-06-25 MED ORDER — VITAMIN B-1 100 MG PO TABS
100.0000 mg | ORAL_TABLET | Freq: Every day | ORAL | Status: DC
  Administered 2015-06-25 – 2015-06-27 (×3): 100 mg via ORAL
  Filled 2015-06-25 (×3): qty 1

## 2015-06-25 MED ORDER — ENSURE ENLIVE PO LIQD
237.0000 mL | Freq: Two times a day (BID) | ORAL | Status: DC
  Administered 2015-06-26 – 2015-06-27 (×2): 237 mL via ORAL

## 2015-06-25 MED ORDER — HEPARIN SODIUM (PORCINE) 5000 UNIT/ML IJ SOLN
5000.0000 [IU] | Freq: Three times a day (TID) | INTRAMUSCULAR | Status: DC
  Administered 2015-06-25 – 2015-06-27 (×5): 5000 [IU] via SUBCUTANEOUS
  Filled 2015-06-25 (×5): qty 1

## 2015-06-25 MED ORDER — ONDANSETRON HCL 4 MG PO TABS: 4.0000 mg | ORAL_TABLET | Freq: Four times a day (QID) | ORAL | Status: DC | PRN

## 2015-06-25 MED ORDER — NICOTINE 21 MG/24HR TD PT24
21.0000 mg | MEDICATED_PATCH | Freq: Every day | TRANSDERMAL | Status: DC
  Administered 2015-06-25 – 2015-06-27 (×3): 21 mg via TRANSDERMAL
  Filled 2015-06-25 (×3): qty 1

## 2015-06-25 MED ORDER — SODIUM CHLORIDE 0.9 % IV SOLN
INTRAVENOUS | Status: DC
  Administered 2015-06-25 – 2015-06-26 (×3): via INTRAVENOUS

## 2015-06-25 NOTE — ED Notes (Signed)
CRITICAL VALUE ALERT  Critical value received: lactic 2.2  Date of notification:  06/25/15  Time of notification:  1803  Critical value read back: yes  Nurse who received alert: Eligah EastJ. Georgios Kina RN  MD notified (1st page):  Clarene DukeMcManus  Time of first page: 1804  MD notified (2nd page):  Time of second page:  Responding MD:  Clarene DukeMcManus  Time MD responded:  [phone]

## 2015-06-25 NOTE — ED Provider Notes (Signed)
CSN: [id number]     Arrival date & time 06/25/15  1618 History   First MD Initiated Contact with Patient 06/25/15 1630     Chief Complaint  Patient presents with  . Fever     Patient is a 59 y.o. female presenting with fever. The history is provided by a relative and the patient. The history is limited by the condition of the patient (Hx alcoholic dementia).  Fever Pt was seen at 1700. Per pt and her family: Pt was seen by her PMD PTA, told she "had a fever above 101" and was sent to the ED for admission. Pt has hx of admission last month for sepsis due to UTI. Pt's family states she was [facility] last week for "red legs," but left AMA after an overnight stay. Pt's family states pt has had a cough for the past several days. Denies home fevers, no N/V/D, no abd pain, no CP/SOB.    Past Medical History  Diagnosis Date  . Hypertension   . GERD (gastroesophageal reflux disease)   . Stroke [facility](HCC) 2008    left sided weakness  . Alcohol abuse   . Normal cardiac stress test 2008    myoview  . Hepatic steatosis   . Cholelithiasis   . Compression fracture     L1  . Alcoholic dementia (HCC)   . Alcoholic cirrhosis (HCC)   . Anemia of chronic disease   . Chronic GI bleeding   . Compression fracture of lumbar vertebra (HCC)   . Thrombocytopenia (HCC)   . Coagulopathy [facility](HCC)    Past Surgical History  Procedure Laterality Date  . Esophagus stretched    . Appendectomy     Family History  Problem Relation Age of Onset  . Leukemia Mother    Social History  Substance Use Topics  . Smoking status: Current Every Day Smoker -- 1.50 packs/day for 40 years    Types: Cigarettes  . Smokeless tobacco: None  . Alcohol Use: 12.0 oz/week    20 Shots of liquor per week     Comment: daily    Review of Systems  Unable to perform ROS: Dementia  Constitutional: Positive for fever.    Allergies  Loratadine  Home Medications   Prior to Admission medications   Medication Sig  Start Date End Date Taking? Authorizing Provider  albuterol (PROVENTIL HFA;VENTOLIN HFA) 108 (90 BASE) MCG/ACT inhaler Inhale 1-2 puffs into the lungs every 6 (six) hours as needed for wheezing. 06/02/15 01/31/19 Yes Lesle ChrisKaren M Black, NP  feeding supplement, ENSURE ENLIVE, (ENSURE ENLIVE) LIQD Take 237 mLs by mouth 2 (two) times daily between meals. 06/02/15  Yes Lesle ChrisKaren M Black, NP  folic acid (FOLVITE) 1 MG tablet Take 1 tablet (1 mg total) by mouth daily. 06/02/15  Yes Lesle ChrisKaren M Black, NP  metoprolol succinate (TOPROL-XL) 25 MG 24 hr tablet Take 1 tablet (25 mg total) by mouth daily. 06/02/15  Yes Lesle ChrisKaren M Black, NP  pantoprazole (PROTONIX) 40 MG tablet Take 1 tablet (40 mg total) by mouth daily with supper. 06/02/15  Yes Lesle ChrisKaren M Black, NP  levofloxacin (LEVAQUIN) 750 MG tablet Take 1 tablet (750 mg total) by mouth daily. Patient not taking: Reported on 06/25/2015 06/02/15   Gwenyth BenderKaren M Black, NP  nicotine (NICODERM CQ - DOSED IN MG/24 HOURS) 21 mg/24hr patch Place 1 patch (21 mg total) onto the skin daily. Patient not taking: Reported on 06/25/2015 06/02/15   Gwenyth BenderKaren M Black, NP  oxyCODONE (OXY IR/ROXICODONE)  5 MG immediate release tablet Take 1 tablet (5 mg total) by mouth every 6 (six) hours as needed for moderate pain. Patient not taking: Reported on 06/25/2015 06/02/15   Gwenyth Bender, NP  predniSONE (DELTASONE) 10 MG tablet Take 4 tabs on 06/03/15 then take 3 tabs 9/23 then take 2 tabs 9/24 then take 1 tab 9/25 then stop Patient not taking: Reported on 06/25/2015 06/02/15   Gwenyth Bender, NP  saccharomyces boulardii (FLORASTOR) 250 MG capsule Take 1 capsule (250 mg total) by mouth 2 (two) times daily. Patient not taking: Reported on 06/25/2015 06/02/15   Gwenyth Bender, NP  thiamine 100 MG tablet Take 1 tablet (100 mg total) by mouth daily. Patient not taking: Reported on 06/25/2015 06/02/15   Gwenyth Bender, NP   BP 116/71 mmHg  Pulse 109  Temp(Src) 100.4 F (38 C) (Rectal)  Resp 25  Wt 148 lb (67.132 kg)   SpO2 99%  Filed Vitals:   06/25/15 1647 06/25/15 1700 06/25/15 1742 06/25/15 1800  BP:  116/71  109/89  Pulse:  109    Temp: 99.6 F (37.6 C)  100.4 F (38 C)   TempSrc: Oral  Rectal   Resp:  25    Weight:      SpO2:  99%      Physical Exam  1705: Physical examination:  Nursing notes reviewed; Vital signs and O2 SAT reviewed;  Constitutional: Well developed, Well nourished, In no acute distress; Head:  Normocephalic, atraumatic; Eyes: EOMI, PERRL, No scleral icterus; ENMT: Mouth and pharynx normal, Mucous membranes dry; Neck: Supple, Full range of motion, No lymphadenopathy; Cardiovascular: Tachycardic rate and rhythm, No gallop; Respiratory: Breath sounds clear & equal bilaterally, No wheezes.  Speaking full sentences with ease, Normal respiratory effort/excursion; Chest: Nontender, Movement normal; Abdomen: Soft, Nontender, Nondistended, Normal bowel sounds; Genitourinary: No CVA tenderness; Extremities: Pulses normal, No tenderness, +1 pedal edema bilat. No calf asymmetry. No erythema to bilat LE's.; Neuro: Awake, alert, mildly confused re: events. No facial droop. Major CN grossly intact.  Speech clear. Moves all extremities spontaneously without apparent gross focal motor deficits.; Skin: Color normal, Warm, Dry.   ED Course  Procedures (including critical care time) Labs Review   Imaging Review  I have personally reviewed and evaluated these images and lab results as part of my medical decision-making.   EKG Interpretation   Date/Time:  Friday June 25 2015 17:01:19 EDT Ventricular Rate:  107 PR Interval:  144 QRS Duration: 85 QT Interval:  346 QTC Calculation: 462 R Axis:   -10 Text Interpretation:  Sinus tachycardia Left axis deviation Borderline T  abnormalities, anterior leads When compared with ECG of 05/26/2015  Borderline T wave abnormality is now Present Confirmed by [facility]  MD,  Nicholos Johns [phone]) on 06/25/2015 5:24:28 PM      MDM  MDM Reviewed: previous  chart, nursing note and vitals Reviewed previous: labs and ECG Interpretation: labs, ECG and x-ray      Results for orders placed or performed during the hospital encounter of 06/25/15  Comprehensive metabolic panel  Result Value Ref Range   Sodium 139 135 - 145 mmol/L   Potassium 3.9 3.5 - 5.1 mmol/L   Chloride 108 101 - 111 mmol/L   CO2 23 22 - 32 mmol/L   Glucose, Bld 108 (H) 65 - 99 mg/dL   BUN 8 6 - 20 mg/dL   Creatinine, Ser 6.04 0.44 - 1.00 mg/dL   Calcium 8.7 (L) 8.9 - 10.3 mg/dL  Total Protein 6.9 6.5 - 8.1 g/dL   Albumin 2.3 (L) 3.5 - 5.0 g/dL   AST 54 (H) 15 - 41 U/L   ALT 15 14 - 54 U/L   Alkaline Phosphatase 172 (H) 38 - 126 U/L   Total Bilirubin 1.9 (H) 0.3 - 1.2 mg/dL   GFR calc non Af Amer >60 >60 mL/min   GFR calc Af Amer >60 >60 mL/min   Anion gap 8 5 - 15  CBC  Result Value Ref Range   WBC 8.4 4.0 - 10.5 K/uL   RBC 2.88 (L) 3.87 - 5.11 MIL/uL   Hemoglobin 9.9 (L) 12.0 - 15.0 g/dL   HCT 16.1 (L) 09.6 - 04.5 %   MCV 110.4 (H) 78.0 - 100.0 fL   MCH 34.4 (H) 26.0 - 34.0 pg   MCHC 31.1 30.0 - 36.0 g/dL   RDW 40.9 81.1 - 91.4 %   Platelets 130 (L) 150 - 400 K/uL  Lactic acid, plasma  Result Value Ref Range   Lactic Acid, Venous 2.2 (HH) 0.5 - 2.0 mmol/L  Urinalysis, Routine w reflex microscopic  Result Value Ref Range   Color, Urine YELLOW YELLOW   APPearance HAZY (A) CLEAR   Specific Gravity, Urine 1.020 1.005 - 1.030   pH 6.0 5.0 - 8.0   Glucose, UA NEGATIVE NEGATIVE mg/dL   Hgb urine dipstick MODERATE (A) NEGATIVE   Bilirubin Urine SMALL (A) NEGATIVE   Ketones, ur NEGATIVE NEGATIVE mg/dL   Protein, ur 30 (A) NEGATIVE mg/dL   Urobilinogen, UA 2.0 (H) 0.0 - 1.0 mg/dL   Nitrite NEGATIVE NEGATIVE   Leukocytes, UA LARGE (A) NEGATIVE  Troponin I  Result Value Ref Range   Troponin I <0.03 <0.031 ng/mL  Ethanol  Result Value Ref Range   Alcohol, Ethyl (B) <5 <5 mg/dL  Protime-INR  Result Value Ref Range   Prothrombin Time 17.6 (H) 11.6 - 15.2  seconds   INR 1.43 0.00 - 1.49  Urine microscopic-add on  Result Value Ref Range   Squamous Epithelial / LPF FEW (A) RARE   WBC, UA TOO NUMEROUS TO COUNT <3 WBC/hpf   RBC / HPF 3-6 <3 RBC/hpf   Bacteria, UA MANY (A) RARE   Dg Chest 2 View 06/25/2015  CLINICAL DATA:  Fever.  Cough.  Wheezing.  Recent pneumonia. EXAM: CHEST  2 VIEW COMPARISON:  05/29/2015 FINDINGS: Artifact and arm position degradation involves the lateral view. Patient rotated to the left on the frontal. Midline trachea. Mild cardiomegaly. No pleural effusion or pneumothorax. Improved aeration. Mild atelectasis or scarring in the right mid lung on the frontal. Increased density projecting over the left lung base on the frontal is partially felt to be due to overlying EKG lead. IMPRESSION: Improved aeration. Cannot exclude residual or recurrent patchy atelectasis versus pneumonia at the left lung base. Cardiomegaly without congestive failure. Electronically Signed   By: Jeronimo Greaves M.D.   On: 06/25/2015 18:21     1900:  +UTI, UC pending; will dose IV rocephin after BC x2 obtained.  H/H per baseline and LFT's improved from previous admission. Concern regarding pt's non-compliance and hx of recent admit for sepsis due to [facility] UTI; will observation admit. Dx and testing d/w pt and family.  Questions answered.  Verb understanding, agreeable to admit.  T/C to [facility]. Conley Rolls, case discussed, including:  HPI, pertinent PM/SHx, VS/PE, dx testing, ED course and treatment:  Agreeable to admit, requests he will come to the ED for evaluation.  Samuel Jester, DO 06/28/15 0005

## 2015-06-25 NOTE — ED Notes (Signed)
MD at bedside. 

## 2015-06-25 NOTE — ED Notes (Signed)
Per EMS patient recently dx with cirrhosis, sent here today by [facility] for eval.

## 2015-06-25 NOTE — H&P (Signed)
[facility] History and Physical  [name] JWJ:[id number] DOB: 06-09-1956    PCP:   Vertis Kelch, NP   Chief Complaint: fever to 101.  HPI: [name] is an 59 y.o. female with hx of alcoholic cirrhosis, mild alcohol dementia, hx of HTN, GERD, recent admission for sepsis and leg cellulitis, signed out AMA, brought in from home as she spiked fever with temp of 101 at home.  She has been cared for by her daughter, and she has been non compliant with taking her medication.  Her daughter has some difficulty taking care of her at home.  She denied coughs, SOB, abdominal pain, nausea, vomiting, or diarrhea.  She has no dysuria or polyuria.  She has not been drinking alcohol for at least several months.  Evaluation in the ER showed Lactic acid of 2.1, Hb of 9 g per dL, and normal Cr. Her total bili was 1.9, with normal SGOT and SGPT.  Her UA showed TNTC WBCs.  She did not have hypotension.  Hospitalist was asked to admit her for UTI, possible early sepsis, and difficult home situation.     Rewiew of Systems:  Constitutional: Negative for malaise, fever and chills. No significant weight loss or weight gain Eyes: Negative for eye pain, redness and discharge, diplopia, visual changes, or flashes of light. ENMT: Negative for ear pain, hoarseness, nasal congestion, sinus pressure and sore throat. No headaches; tinnitus, drooling, or problem swallowing. Cardiovascular: Negative for chest pain, palpitations, diaphoresis, dyspnea and peripheral edema. ; No orthopnea, PND Respiratory: Negative for cough, hemoptysis, wheezing and stridor. No pleuritic chestpain. Gastrointestinal: Negative for nausea, vomiting, diarrhea, constipation, abdominal pain, melena, blood in stool, hematemesis, jaundice and rectal bleeding.    Genitourinary: Negative for frequency, dysuria, incontinence,flank pain and hematuria; Musculoskeletal: Negative for back pain and neck pain. Negative for swelling and trauma.;   Skin: . Negative for pruritus, rash, abrasions, bruising and skin lesion.; ulcerations Neuro: Negative for headache, lightheadedness and neck stiffness. Negative for weakness, altered level of consciousness , altered mental status, extremity weakness, burning feet, involuntary movement, seizure and syncope.  Psych: negative for anxiety, depression, insomnia, tearfulness, panic attacks, hallucinations, paranoia, suicidal or homicidal ideation    Past Medical History  Diagnosis Date  . Hypertension   . GERD (gastroesophageal reflux disease)   . Stroke [facility]) 2008    left sided weakness  . Alcohol abuse   . Normal cardiac stress test 2008    myoview  . Hepatic steatosis   . Cholelithiasis   . Compression fracture     L1  . Alcoholic dementia (HCC)   . Alcoholic cirrhosis (HCC)   . Anemia of chronic disease   . Chronic GI bleeding   . Compression fracture of lumbar vertebra (HCC)   . Thrombocytopenia (HCC)   . Coagulopathy [facility])     Past Surgical History  Procedure Laterality Date  . Esophagus stretched    . Appendectomy      Medications:  HOME MEDS: Prior to Admission medications   Medication Sig Start Date End Date Taking? Authorizing Provider  albuterol (PROVENTIL HFA;VENTOLIN HFA) 108 (90 BASE) MCG/ACT inhaler Inhale 1-2 puffs into the lungs every 6 (six) hours as needed for wheezing. 06/02/15 01/31/19 Yes Lesle Chris Black, NP  feeding supplement, ENSURE ENLIVE, (ENSURE ENLIVE) LIQD Take 237 mLs by mouth 2 (two) times daily between meals. 06/02/15  Yes Lesle Chris Black, NP  folic acid (FOLVITE) 1 MG tablet Take 1 tablet (1 mg total) by mouth  daily. 06/02/15  Yes Lesle ChrisKaren M Black, NP  metoprolol succinate (TOPROL-XL) 25 MG 24 hr tablet Take 1 tablet (25 mg total) by mouth daily. 06/02/15  Yes Lesle ChrisKaren M Black, NP  pantoprazole (PROTONIX) 40 MG tablet Take 1 tablet (40 mg total) by mouth daily with supper. 06/02/15  Yes Lesle ChrisKaren M Black, NP  levofloxacin (LEVAQUIN) 750 MG tablet Take 1 tablet  (750 mg total) by mouth daily. Patient not taking: Reported on 06/25/2015 06/02/15   Gwenyth BenderKaren M Black, NP  nicotine (NICODERM CQ - DOSED IN MG/24 HOURS) 21 mg/24hr patch Place 1 patch (21 mg total) onto the skin daily. Patient not taking: Reported on 06/25/2015 06/02/15   Gwenyth BenderKaren M Black, NP  oxyCODONE (OXY IR/ROXICODONE) 5 MG immediate release tablet Take 1 tablet (5 mg total) by mouth every 6 (six) hours as needed for moderate pain. Patient not taking: Reported on 06/25/2015 06/02/15   Gwenyth BenderKaren M Black, NP  predniSONE (DELTASONE) 10 MG tablet Take 4 tabs on 06/03/15 then take 3 tabs 9/23 then take 2 tabs 9/24 then take 1 tab 9/25 then stop Patient not taking: Reported on 06/25/2015 06/02/15   Gwenyth BenderKaren M Black, NP  saccharomyces boulardii (FLORASTOR) 250 MG capsule Take 1 capsule (250 mg total) by mouth 2 (two) times daily. Patient not taking: Reported on 06/25/2015 06/02/15   Gwenyth BenderKaren M Black, NP  thiamine 100 MG tablet Take 1 tablet (100 mg total) by mouth daily. Patient not taking: Reported on 06/25/2015 06/02/15   Gwenyth BenderKaren M Black, NP     Allergies:  Allergies  Allergen Reactions  . Loratadine Rash    Social History:   reports that she has been smoking Cigarettes.  She has a 60 pack-year smoking history. She does not have any smokeless tobacco history on file. She reports that she drinks about 12.0 oz of alcohol per week. She reports that she does not use illicit drugs.  Family History: Family History  Problem Relation Age of Onset  . Leukemia Mother      Physical Exam: Filed Vitals:   06/25/15 1647 06/25/15 1700 06/25/15 1742 06/25/15 1800  BP:  116/71  109/89  Pulse:  109    Temp: 99.6 F (37.6 C)  100.4 F (38 C)   TempSrc: Oral  Rectal   Resp:  25    Weight:      SpO2:  99%     Blood pressure 109/89, pulse 109, temperature 100.4 F (38 C), temperature source Rectal, resp. rate 25, weight 67.132 kg (148 lb), SpO2 99 %.  GEN:  Pleasant patient lying in the stretcher in no acute  distress; cooperative with exam. PSYCH:  alert and oriented x4; does not appear anxious or depressed; affect is appropriate. HEENT: Mucous membranes pink and anicteric; PERRLA; EOM intact; no cervical lymphadenopathy nor thyromegaly or carotid bruit; no JVD; There were no stridor. Neck is very supple. Breasts:: Not examined CHEST WALL: No tenderness CHEST: Normal respiration, clear to auscultation bilaterally.  HEART: Regular rate and rhythm.  There are no murmur, rub, or gallops.   BACK: No kyphosis or scoliosis; no CVA tenderness ABDOMEN: soft and non-tender; no masses, no organomegaly, normal abdominal bowel sounds; no pannus; no intertriginous candida. There is no rebound and no distention. Rectal Exam: Not done EXTREMITIES: No bone or joint deformity; age-appropriate arthropathy of the hands and knees; no edema; no ulcerations.  There is no calf tenderness. Genitalia: not examined PULSES: 2+ and symmetric SKIN: Normal hydration no rash or ulceration CNS: Cranial nerves  2-12 grossly intact no focal lateralizing neurologic deficit.  Speech is fluent; uvula elevated with phonation, facial symmetry and tongue midline. DTR are normal bilaterally, cerebella exam is intact, barbinski is negative and strengths are equaled bilaterally.  No sensory loss.   Labs on Admission:  Basic Metabolic Panel:  Recent Labs Lab 06/25/15 1700  NA 139  K 3.9  CL 108  CO2 23  GLUCOSE 108*  BUN 8  CREATININE 0.80  CALCIUM 8.7*   Liver Function Tests:  Recent Labs Lab 06/25/15 1700  AST 54*  ALT 15  ALKPHOS 172*  BILITOT 1.9*  PROT 6.9  ALBUMIN 2.3*   No results for input(s): LIPASE, AMYLASE in the last 168 hours. No results for input(s): AMMONIA in the last 168 hours. CBC:  Recent Labs Lab 06/25/15 1700  WBC 8.4  HGB 9.9*  HCT 31.8*  MCV 110.4*  PLT 130*   Cardiac Enzymes:  Recent Labs Lab 06/25/15 1738  TROPONINI <0.03   Radiological Exams on Admission: Dg Chest 2  View  06/25/2015  CLINICAL DATA:  Fever.  Cough.  Wheezing.  Recent pneumonia. EXAM: CHEST  2 VIEW COMPARISON:  05/29/2015 FINDINGS: Artifact and arm position degradation involves the lateral view. Patient rotated to the left on the frontal. Midline trachea. Mild cardiomegaly. No pleural effusion or pneumothorax. Improved aeration. Mild atelectasis or scarring in the right mid lung on the frontal. Increased density projecting over the left lung base on the frontal is partially felt to be due to overlying EKG lead. IMPRESSION: Improved aeration. Cannot exclude residual or recurrent patchy atelectasis versus pneumonia at the left lung base. Cardiomegaly without congestive failure. Electronically Signed   By: Jeronimo Greaves M.D.   On: 06/25/2015 18:21    Assessment/Plan Present on Admission:  . UTI (lower urinary tract infection) . ETOH abuse . Abnormal liver function  PLAN:  Will admit her for UTI, possible early sepsis, and start her on IV Rocephin.  She has agreed to stay rather than signing out AMA again this time.  I have consulted LSW to help with disposition, though she likely will want to go home at discharge.  She is currently applying for Medicaid, but has not gotten approval, from my understanding.   Will hold her BP meds for now.  She is stable, full code, and will be admitted to [facility] service.  Thank you and Good Day.   Other plans as per orders.  Code Status: FULL Unk Lightning, MD. [facility] Pager [phone] 7pm to 7am.  06/25/2015, 8:07 PM

## 2015-06-25 NOTE — ED Notes (Signed)
Per daughter, patient recently admitted for sepsis here in September and again on October [facility]th at [facility]. [facility]. Daughter states that today at follow up, patient was found to have elevated heart rate and fever and was sent here for eval.

## 2015-06-25 NOTE — Progress Notes (Signed)
Tried sticking patient twice for Lab, brachial cannot feel radial pulse, barely can feel brachial. No success.

## 2015-06-26 DIAGNOSIS — K7030 Alcoholic cirrhosis of liver without ascites: Secondary | ICD-10-CM | POA: Diagnosis present

## 2015-06-26 DIAGNOSIS — D696 Thrombocytopenia, unspecified: Secondary | ICD-10-CM | POA: Diagnosis present

## 2015-06-26 DIAGNOSIS — F1010 Alcohol abuse, uncomplicated: Secondary | ICD-10-CM

## 2015-06-26 DIAGNOSIS — N390 Urinary tract infection, site not specified: Secondary | ICD-10-CM

## 2015-06-26 DIAGNOSIS — F1097 Alcohol use, unspecified with alcohol-induced persisting dementia: Secondary | ICD-10-CM | POA: Diagnosis present

## 2015-06-26 DIAGNOSIS — Z9114 Patient's other noncompliance with medication regimen: Secondary | ICD-10-CM | POA: Diagnosis not present

## 2015-06-26 DIAGNOSIS — D638 Anemia in other chronic diseases classified elsewhere: Secondary | ICD-10-CM

## 2015-06-26 DIAGNOSIS — K760 Fatty (change of) liver, not elsewhere classified: Secondary | ICD-10-CM | POA: Diagnosis present

## 2015-06-26 DIAGNOSIS — A419 Sepsis, unspecified organism: Principal | ICD-10-CM

## 2015-06-26 DIAGNOSIS — I10 Essential (primary) hypertension: Secondary | ICD-10-CM | POA: Diagnosis present

## 2015-06-26 DIAGNOSIS — F17210 Nicotine dependence, cigarettes, uncomplicated: Secondary | ICD-10-CM | POA: Diagnosis present

## 2015-06-26 DIAGNOSIS — Z23 Encounter for immunization: Secondary | ICD-10-CM | POA: Diagnosis not present

## 2015-06-26 DIAGNOSIS — K219 Gastro-esophageal reflux disease without esophagitis: Secondary | ICD-10-CM | POA: Diagnosis present

## 2015-06-26 DIAGNOSIS — R509 Fever, unspecified: Secondary | ICD-10-CM | POA: Diagnosis present

## 2015-06-26 DIAGNOSIS — Z806 Family history of leukemia: Secondary | ICD-10-CM | POA: Diagnosis not present

## 2015-06-26 DIAGNOSIS — Z8673 Personal history of transient ischemic attack (TIA), and cerebral infarction without residual deficits: Secondary | ICD-10-CM | POA: Diagnosis not present

## 2015-06-26 LAB — CBC
HCT: 24.7 % — ABNORMAL LOW (ref 36.0–46.0)
HEMOGLOBIN: 7.8 g/dL — AB (ref 12.0–15.0)
MCH: 34.7 pg — ABNORMAL HIGH (ref 26.0–34.0)
MCHC: 31.6 g/dL (ref 30.0–36.0)
MCV: 109.8 fL — ABNORMAL HIGH (ref 78.0–100.0)
Platelets: 102 10*3/uL — ABNORMAL LOW (ref 150–400)
RBC: 2.25 MIL/uL — ABNORMAL LOW (ref 3.87–5.11)
RDW: 15 % (ref 11.5–15.5)
WBC: 6.5 10*3/uL (ref 4.0–10.5)

## 2015-06-26 LAB — COMPREHENSIVE METABOLIC PANEL
ALBUMIN: 1.9 g/dL — AB (ref 3.5–5.0)
ALT: 13 U/L — ABNORMAL LOW (ref 14–54)
ANION GAP: 8 (ref 5–15)
AST: 41 U/L (ref 15–41)
Alkaline Phosphatase: 138 U/L — ABNORMAL HIGH (ref 38–126)
BILIRUBIN TOTAL: 1.4 mg/dL — AB (ref 0.3–1.2)
BUN: 7 mg/dL (ref 6–20)
CHLORIDE: 111 mmol/L (ref 101–111)
CO2: 22 mmol/L (ref 22–32)
Calcium: 8.1 mg/dL — ABNORMAL LOW (ref 8.9–10.3)
Creatinine, Ser: 0.63 mg/dL (ref 0.44–1.00)
GFR calc Af Amer: >60 mL/min (ref >60)
GFR calc non Af Amer: >60 mL/min (ref >60)
GLUCOSE: 86 mg/dL (ref 65–99)
POTASSIUM: 3.4 mmol/L — AB (ref 3.5–5.1)
Sodium: 141 mmol/L (ref 135–145)
Total Protein: 5.8 g/dL — ABNORMAL LOW (ref 6.5–8.1)

## 2015-06-26 LAB — LACTIC ACID, PLASMA: Lactic Acid, Venous: 1.2 mmol/L (ref 0.5–2.0)

## 2015-06-26 MED ORDER — CIPROFLOXACIN HCL 250 MG PO TABS
500.0000 mg | ORAL_TABLET | Freq: Two times a day (BID) | ORAL | Status: DC
  Administered 2015-06-27: 500 mg via ORAL
  Filled 2015-06-26: qty 2

## 2015-06-26 MED ORDER — ALBUTEROL SULFATE (2.5 MG/3ML) 0.083% IN NEBU
3.0000 mL | INHALATION_SOLUTION | Freq: Four times a day (QID) | RESPIRATORY_TRACT | Status: DC
  Administered 2015-06-26 – 2015-06-27 (×3): 3 mL via RESPIRATORY_TRACT
  Filled 2015-06-26 (×3): qty 3

## 2015-06-26 MED ORDER — METOPROLOL SUCCINATE ER 25 MG PO TB24
25.0000 mg | ORAL_TABLET | Freq: Every day | ORAL | Status: DC
  Administered 2015-06-26 – 2015-06-27 (×2): 25 mg via ORAL
  Filled 2015-06-26 (×2): qty 1

## 2015-06-26 NOTE — Progress Notes (Signed)
Pt arrived on floor round 2300 with daughter.  Blood cultures  collected by lab tech.  Pt ate small amt of TV dinner then went to sleep.  She was unable to swallow large pills - requested apple sauce for assistance.   No complaints of pain - only wanted to smoke. Nicotine patch applied as ordered.

## 2015-06-26 NOTE — Progress Notes (Signed)
Patient wanting to sign herself out AMA.  Husband is at bedside refusing to take her home.  Dr. Irene LimboGoodrich notified.

## 2015-06-26 NOTE — Progress Notes (Addendum)
PROGRESS NOTE  [name]:[id number] DOB: 30-Dec-1955 DOA: 06/25/2015 PCP: Vertis Kelch, NP  Summary: 62 yow PMHalcoholic cirrhosis, mild alcohol dementia, HTN, GERD. Recently admit to [facility] for leg cellulitis and signed out AMA, brought in from home as she spiked fever with temp of 101 at home. Per daughter who is her caretaker, she has been non compliant with taking her medication. Evaluation in the ER showed Lactic acid of 2.1, Hb of 9, and normal Cr. Her total bili was 1.9, with normal SGOT and SGPT. Her UA showed TNTC WBCs. She did not have hypotension. Admitted for UTI and possible early sepsis.  Assessment/Plan: 1. UTI possible early sepsis. Continue empiric abx. UC pending. WBC wnl, temp 100.56F.  2. Alcoholic cirrhosis with associated alcoholic dementia, thrombocytopenia, anemia. LFTs relatively unremarkable. 3. ETOH abuse, in remission.  4. HTN.  5. Anemia of chronic disease. Hgb 7.8. Stable. Continue to monitor. No evidence of bleeding. 6. PMH sepsis secondary to E coli UTI with bacteremia 05/2015    Overall stable. Continue IV abx and follow up culture data.  Likely discharge home tomorrow if continues to improve.  Code Status: Full DVT prophylaxis: SCDs Family Communication: Husband at bedside. Discussed with patient who understands and has no concerns at this time. Disposition Plan: Anticipate discharge tomorrow.   Brendia Sacks, MD  [facility]  Pager [phone] If 7PM-7AM, please contact night-coverage at [email or web address], password [facility] 06/26/2015, 7:16 AM    Consultants:    Procedures:    Antibiotics:  Rocephin 10/15  HPI/Subjective: Denies any pain, nausea, or vomiting. Has some SOB which was relieved by O2 [place]. Cough is improved.   Objective: Filed Vitals:   06/25/15 2120 06/25/15 2157 06/26/15 0557 06/26/15 0634  BP: 128/65   118/56  Pulse: 95     Temp: 98.9 F (37.2 C)   100.4 F (38 C)  TempSrc: Oral   Oral  Resp: 20   14   Height:   (1.676 m)    Weight:      SpO2: 100% 96% 93% 100%   No intake or output data in the 24 hours ending 06/26/15 0716   Filed Weights   06/25/15 1617  Weight: 67.132 kg (148 lb)    Exam:    VSS, temp 100.4, not hypoxic on [place] General:  Appears calm and comfortable Eyes: PERRL, normal lids, irises & conjunctiva ENT: grossly normal hearing, lips & tongue Neck: no LAD, masses or thyromegaly Cardiovascular: RRR, no m/r/g. No LE edema. Telemetry: Sinus tachycardia, no arrhythmias  Respiratory: CTA bilaterally, no w/r/r. Normal respiratory effort. Abdomen: soft, ntnd Skin: no rash or induration seen Musculoskeletal: grossly normal tone BUE/BLE Psychiatric: grossly normal mood and affect, speech fluent and appropriate. Oriented to self, place, month, and president. Believes it is 2017.  Neurologic: grossly non-focal.  New data reviewed:  LFTs and BMP unremarkable  WBC wnl, Hgb 7.8 , Platelets 102  Pertinent data since admission:  Initial Lactic acid 2.2  EKG ST  CXR: favor atelectasis. IMPRESSION: Improved aeration. Cannot exclude residual or recurrent patchy atelectasis versus pneumonia at the left lung base.  Cardiomegaly without congestive failure.  Pending data:  BC/UC  Scheduled Meds: . albuterol  3 mL Inhalation Q6H  . cefTRIAXone (ROCEPHIN)  IV  1 g Intravenous Q24H  . feeding supplement (ENSURE ENLIVE)  237 mL Oral BID BM  . folic acid  1 mg Oral Daily  . heparin  5,000 Units Subcutaneous 3 times per day  . nicotine  21 mg Transdermal Daily  . pantoprazole  40 mg Oral Q supper  . saccharomyces boulardii  250 mg Oral BID  . sodium chloride  3 mL Intravenous Q12H  . thiamine  100 mg Oral Daily   Continuous Infusions: . sodium chloride 100 mL/hr at 06/25/15 2216    Principal Problem:   UTI (lower urinary tract infection) Active Problems:   Alcohol abuse   Alcoholic cirrhosis (HCC)   ETOH abuse   Sepsis (HCC)   Anemia of chronic  disease   Time spent 25 minutes    By signing my name below, I, Burnett HarryJennifer Gregorio attest that this documentation has been prepared under the direction and in the presence of Brendia Sacksaniel Goodrich, MD Electronically signed: Burnett HarryJennifer Gregorio, Scribe.  06/26/2015 11:01am  I personally performed the services described in this documentation. All medical record entries made by the scribe were at my direction. I have reviewed the chart and agree that the record reflects my personal performance and is accurate and complete. Brendia Sacksaniel Goodrich, MD

## 2015-06-27 MED ORDER — THERA VITAL M PO TABS: 1.0000 | ORAL_TABLET | Freq: Every day | ORAL | Status: DC

## 2015-06-27 MED ORDER — CEFUROXIME AXETIL 500 MG PO TABS: 500.0000 mg | ORAL_TABLET | Freq: Two times a day (BID) | ORAL | Status: DC

## 2015-06-27 MED ORDER — CEFUROXIME AXETIL 250 MG PO TABS
500.0000 mg | ORAL_TABLET | Freq: Two times a day (BID) | ORAL | Status: DC
  Administered 2015-06-27: 500 mg via ORAL
  Filled 2015-06-27 (×2): qty 2

## 2015-06-27 NOTE — Progress Notes (Signed)
Pt lost IV access and refused another stick.  Pt states "I'm going home tomorrow no matter what the Dr says and no one is going to stick me again". Scheduled IV Rocphine-not given.  Dr on call notified and suggested to note that pt refused another IV and to let her primary dr make the decision as to what to do about the rocephin when he visits her later this morning.

## 2015-06-27 NOTE — Progress Notes (Signed)
   06/27/15 0933  What Happened  Was fall witnessed? No  Was patient injured? Yes  Patient found on floor  Found by Staff-comment Trula Ore(Christina RN)  Stated prior activity bathroom-unassisted  Follow Up  MD notified Yes  Time MD notified [phone]  Family notified Yes-comment (Daughter [name](Abdulhadi Stopa Poindexter))  Time family notified [phone]  Simple treatment Dressing  Progress note created (see row info) Yes

## 2015-06-27 NOTE — Discharge Summary (Signed)
Physician Discharge Summary  [name]:[id number] DOB: [birth year] DOA: 06/25/2015  PCP: Vertis Kelch, NP  Admit date: 06/25/2015 Discharge date: 06/27/2015  Recommendations for Outpatient Follow-up:  1. Follow up with PCP in 1-2 weeks for resolution of UTI. Follow up culture data. Continue abx as prescribed.  Follow-up Information    Follow up with Vertis Kelch, NP. Schedule an appointment as soon as possible for a visit in 1 week.   Specialty:  Nurse Practitioner      Discharge Diagnoses:  1. UTI possible early sepsis. 2. Alcoholic cirrhosis with associated alcoholic dementia, thrombocytopenia, anemia. 3. ETOH abuse, in remission.  4. Essential HTN. 5. Anemia of chronic disease.  Discharge Condition: Improved Disposition: Home  Diet recommendation: Regular  Filed Weights   06/25/15 1617 06/26/15 0634  Weight: 67.132 kg (148 lb) 60.691 kg (133 lb 12.8 oz)    History of present illness:  59 yow PMHalcoholic cirrhosis, mild alcohol dementia, HTN, GERD. Recently admit to [facility] for leg cellulitis and signed out AMA, brought in from home as she spiked fever with temp of 101 at home. Per daughter who is her caretaker, she has been non compliant with taking her medication. Evaluation in the ER showed Lactic acid of 2.1, Hb of 9, and normal Cr. Her total bili was 1.9, with normal SGOT and SGPT. Her UA showed TNTC WBCs. She did not have hypotension. Admitted for UTI and possible early sepsis.  Hospital Course:   UTI with possible early sepsis was treated with IV abx. Low grade temperature only, hemodynamics stable. UC shows no growth to date however final results are still pending. Her WBC remained normal. Alcoholic cirrhosis with associated alcoholic dementia, thrombocytopenia, and anemia remained stable. LFTs were relatively unremarkable. ETOH abuse, in remission remained stable.   1. UTI possible early sepsis, appears resolved. Transitioned to oral abx. UC  pending. WBC wnl. Low grade temp likely atelectasis. 2. Alcoholic cirrhosis with associated alcoholic dementia, thrombocytopenia, anemia. Stable. 3. ETOH abuse, in remission.  4. Essential HTN, stable.  5. Anemia of chronic disease. Stable. No evidence of bleeding. 6. PMH sepsis secondary to E coli UTI with bacteremia 05/2015   Overall improved. Discharge home today on oral abx. Follow-up urine culture.  CXR noted, clinical findings c/w atelectasis.  Consultants:  none  Procedures:  none  Antibiotics:  Rocephin 10/15>>10/15  Ceftin 10/16>>10/21   Discharge Instructions Discharge Instructions    Diet - low sodium heart healthy    Complete by:  As directed      Discharge instructions    Complete by:  As directed   Call your physician or seek immediate medical attention for fever, confusion, difficulty urinating or worsening of condition.     Increase activity slowly    Complete by:  As directed            Discharge Medication List as of 06/27/2015 10:24 AM    START taking these medications   Details  cefUROXime (CEFTIN) 500 MG tablet Take 1 tablet (500 mg total) by mouth 2 (two) times daily with a meal., Starting 06/27/2015, Until Discontinued, Normal    Multiple Vitamins-Minerals (MULTIVITAMIN) tablet Take 1 tablet by mouth daily., Starting 06/27/2015, Until Discontinued, No Print      CONTINUE these medications which have NOT CHANGED   Details  albuterol (PROVENTIL HFA;VENTOLIN HFA) 108 (90 BASE) MCG/ACT inhaler Inhale 1-2 puffs into the lungs every 6 (six) hours as needed for wheezing., Starting 06/02/2015, Until Fri 01/31/19, Print  feeding supplement, ENSURE ENLIVE, (ENSURE ENLIVE) LIQD Take 237 mLs by mouth 2 (two) times daily between meals., Starting 06/02/2015, Until Discontinued, OTC    folic acid (FOLVITE) 1 MG tablet Take 1 tablet (1 mg total) by mouth daily., Starting 06/02/2015, Until Discontinued, Print    metoprolol succinate (TOPROL-XL) 25 MG 24  hr tablet Take 1 tablet (25 mg total) by mouth daily., Starting 06/02/2015, Until Discontinued, Print    pantoprazole (PROTONIX) 40 MG tablet Take 1 tablet (40 mg total) by mouth daily with supper., Starting 06/02/2015, Until Discontinued, Print    nicotine (NICODERM CQ - DOSED IN MG/24 HOURS) 21 mg/24hr patch Place 1 patch (21 mg total) onto the skin daily., Starting 06/02/2015, Until Discontinued, OTC    saccharomyces boulardii (FLORASTOR) 250 MG capsule Take 1 capsule (250 mg total) by mouth 2 (two) times daily., Starting 06/02/2015, Until Discontinued, Print    thiamine 100 MG tablet Take 1 tablet (100 mg total) by mouth daily., Starting 06/02/2015, Until Discontinued, Print      STOP taking these medications     levofloxacin (LEVAQUIN) 750 MG tablet      oxyCODONE (OXY IR/ROXICODONE) 5 MG immediate release tablet      predniSONE (DELTASONE) 10 MG tablet        Allergies  Allergen Reactions  . Loratadine Rash    The results of significant diagnostics from this hospitalization (including imaging, microbiology, ancillary and laboratory) are listed below for reference.    Significant Diagnostic Studies: Dg Chest 2 View  06/25/2015  CLINICAL DATA:  Fever.  Cough.  Wheezing.  Recent pneumonia. EXAM: CHEST  2 VIEW COMPARISON:  05/29/2015 FINDINGS: Artifact and arm position degradation involves the lateral view. Patient rotated to the left on the frontal. Midline trachea. Mild cardiomegaly. No pleural effusion or pneumothorax. Improved aeration. Mild atelectasis or scarring in the right mid lung on the frontal. Increased density projecting over the left lung base on the frontal is partially felt to be due to overlying EKG lead. IMPRESSION: Improved aeration. Cannot exclude residual or recurrent patchy atelectasis versus pneumonia at the left lung base. Cardiomegaly without congestive failure. Electronically Signed   By: Jeronimo GreavesKyle  Talbot M.D.   On: 06/25/2015 18:21   Microbiology: Recent Results  (from the past 240 hour(s))  Culture, blood (routine x 2)     Status: None (Preliminary result)   Collection Time: 06/25/15  5:38 PM  Result Value Ref Range Status   Specimen Description BLOOD RIGHT HAND  Final   Special Requests BOTTLES DRAWN AEROBIC ONLY 6CC  Final   Culture NO GROWTH 2 DAYS  Final   Report Status PENDING  Incomplete  Urine culture     Status: None (Preliminary result)   Collection Time: 06/25/15  5:46 PM  Result Value Ref Range Status   Specimen Description URINE, RANDOM  Final   Special Requests NONE  Final   Culture   Final    TOO YOUNG TO READ [facility]    Report Status PENDING  Incomplete  Culture, blood (routine x 2)     Status: None (Preliminary result)   Collection Time: 06/26/15  2:39 AM  Result Value Ref Range Status   Specimen Description BLOOD RIGHT HAND  Final   Special Requests BOTTLES DRAWN AEROBIC AND ANAEROBIC 6CC  Final   Culture NO GROWTH 1 DAY  Final   Report Status PENDING  Incomplete     Labs: Basic Metabolic Panel:  Recent Labs Lab 06/25/15 1700 06/26/15  0524  NA 139 141  K 3.9 3.4*  CL 108 111  CO2 23 22  GLUCOSE 108* 86  BUN 8 7  CREATININE 0.80 0.63  CALCIUM 8.7* 8.1*   Liver Function Tests:  Recent Labs Lab 06/25/15 1700 06/26/15 0524  AST 54* 41  ALT 15 13*  ALKPHOS 172* 138*  BILITOT 1.9* 1.4*  PROT 6.9 5.8*  ALBUMIN 2.3* 1.9*    CBC:  Recent Labs Lab 06/25/15 1700 06/26/15 0524  WBC 8.4 6.5  HGB 9.9* 7.8*  HCT 31.8* 24.7*  MCV 110.4* 109.8*  PLT 130* 102*   Cardiac Enzymes:  Recent Labs Lab 06/25/15 1738  TROPONINI <0.03      Principal Problem:   UTI (lower urinary tract infection) Active Problems:   Alcohol abuse   Alcoholic cirrhosis (HCC)   ETOH abuse   Sepsis (HCC)   Anemia of chronic disease   Time coordinating discharge: 35 minutes  Signed:  Brendia Sacks, MD [facility] 06/27/2015, 9:10 AM   By signing my name below, I, Burnett Harry attest that this documentation has been prepared under the direction and in the presence of Brendia Sacks, MD Electronically signed: Burnett Harry, Scribe.  06/27/2015  I personally performed the services described in this documentation. All medical record entries made by the scribe were at my direction. I have reviewed the chart and agree that the record reflects my personal performance and is accurate and complete. Brendia Sacks, MD

## 2015-06-27 NOTE — Progress Notes (Signed)
Discharge instructions, medications and follow up appointments reviewed and discussed with patient. All questions were answered and no further questions at this time. Pt verbalized understanding of discharge instructions. Pt in stable condition and in no acute distress at this time. Pt escorted by nurse tech.

## 2015-06-27 NOTE — Progress Notes (Signed)
PROGRESS NOTE  [name] AVW:[id number] DOB: 04-25-1956 DOA: 06/25/2015 PCP: Vertis Kelch, NP  Summary: 74 yow PMHalcoholic cirrhosis, mild alcohol dementia, HTN, GERD. Recently admit to [facility] for leg cellulitis and signed out AMA, brought in from home as she spiked fever with temp of 101 at home. Per daughter who is her caretaker, she has been non compliant with taking her medication. Evaluation in the ER showed Lactic acid of 2.1, Hb of 9, and normal Cr. Her total bili was 1.9, with normal SGOT and SGPT. Her UA showed TNTC WBCs. She did not have hypotension. Admitted for UTI and possible early sepsis.  Assessment/Plan: 1. UTI possible early sepsis, appears resolved. Transitioned to oral abx. UC pending. WBC wnl. Low grade temp likely atelectasis. 2. Alcoholic cirrhosis with associated alcoholic dementia, thrombocytopenia, anemia. Stable. 3. ETOH abuse, in remission.  4. Essential HTN, stable.  5. Anemia of chronic disease. Stable. No evidence of bleeding. 6. PMH sepsis secondary to E coli UTI with bacteremia 05/2015    Overall improved. Discharge home today on oral abx. Follow-up urine culture.  CXR noted, clinical findings c/w atelectasis.  Code Status: Full DVT prophylaxis: SCDs Family Communication:  Discussed with patient who understands and has no concerns at this time. Disposition Plan:  Discharge home today.   Brendia Sacks, MD  [facility]  Pager [phone] If 7PM-7AM, please contact night-coverage at [email or web address], password [facility] 06/27/2015, 6:56 AM  LOS: 1 day   Consultants:    Procedures:    Antibiotics:  Rocephin 10/15..10/15  Ceftin 10/16>>10/21  HPI/Subjective: Feeling good. Denies any pain, nausea, or vomiting. Eating well.   Objective: Filed Vitals:   06/26/15 1535 06/26/15 1935 06/26/15 2238 06/27/15 0627  BP: 117/63  139/88 116/69  Pulse: 102  103 100  Temp: 98.8 F (37.1 C)  99 F (37.2 C) 100.5 F (38.1 C)  TempSrc:  Oral  Oral Oral  Resp: Height:      Weight:      SpO2: 100% 100% 100% 100%    Intake/Output Summary (Last 24 hours) at 06/27/15 0656 Last data filed at 06/26/15 1745  Gross per 24 hour  Intake    880 ml  Output      1 ml  Net    879 ml     Filed Weights   06/25/15 1617 06/26/15 0634  Weight: 67.132 kg (148 lb) 60.691 kg (133 lb 12.8 oz)    Exam:    VSS, temp 100.5, not hypoxic on [place] General:  Appears comfortable, calm. Eyes: PERRL, normal lids, irises ENT: grossly normal hearing, lips, tongue Cardiovascular: Regular rate and rhythm, no murmur, rub or gallop. 1+ LLE edema.  Respiratory: Clear to auscultation bilaterally except for a few crackles on the right side. No wheezes  or rhonchi. Normal respiratory effort. Musculoskeletal: grossly normal tone bilateral upper and lower extremities Psychiatric: grossly normal mood and affect, speech fluent and appropriate. Oriented to person, place, year, and president. Neurologic: grossly non-focal.   New data reviewed:  none  Pertinent data since admission:  Initial Lactic acid 2.2  EKG ST  CXR: favor atelectasis. IMPRESSION: Improved aeration. Cannot exclude residual or recurrent patchy atelectasis versus pneumonia at the left lung base.  Cardiomegaly without congestive failure.  Pending data:  BC/UC  Scheduled Meds: . albuterol  3 mL Inhalation Q6H  . ciprofloxacin  500 mg Oral Q12H  . feeding supplement (ENSURE ENLIVE)  237 mL Oral BID BM  . folic acid  1 mg Oral Daily  . heparin  5,000 Units Subcutaneous 3 times per day  . metoprolol succinate  25 mg Oral Daily  . nicotine  21 mg Transdermal Daily  . pantoprazole  40 mg Oral Q supper  . saccharomyces boulardii  250 mg Oral BID  . sodium chloride  3 mL Intravenous Q12H  . thiamine  100 mg Oral Daily   Continuous Infusions:    Principal Problem:   UTI (lower urinary tract infection) Active Problems:   Alcohol abuse   Alcoholic cirrhosis  (HCC)   ETOH abuse   Sepsis (HCC)   Anemia of chronic disease    By signing my name below, I, Burnett HarryJennifer Gregorio attest that this documentation has been prepared under the direction and in the presence of Brendia Sacksaniel Goodrich, MD Electronically signed: Burnett HarryJennifer Gregorio, Scribe.  06/27/2015 9:04am  I personally performed the services described in this documentation. All medical record entries made by the scribe were at my direction. I have reviewed the chart and agree that the record reflects my personal performance and is accurate and complete. Brendia Sacksaniel Goodrich, MD

## 2015-06-27 NOTE — Progress Notes (Signed)
Pt assessed by MD.

## 2015-06-29 LAB — URINE CULTURE

## 2015-06-30 LAB — CULTURE, BLOOD (ROUTINE X 2): CULTURE: NO GROWTH

## 2015-07-01 LAB — CULTURE, BLOOD (ROUTINE X 2): Culture: NO GROWTH

## 2015-07-06 ENCOUNTER — Other Ambulatory Visit: Payer: Self-pay

## 2015-07-06 ENCOUNTER — Encounter: Payer: Self-pay | Admitting: Gastroenterology

## 2015-07-06 ENCOUNTER — Ambulatory Visit (INDEPENDENT_AMBULATORY_CARE_PROVIDER_SITE_OTHER): Payer: Self-pay | Admitting: Gastroenterology

## 2015-07-06 VITALS — BP 120/80 | HR 117 | Temp 97.0°F | Ht 66.0 in | Wt 127.4 lb

## 2015-07-06 DIAGNOSIS — K921 Melena: Secondary | ICD-10-CM

## 2015-07-06 DIAGNOSIS — D539 Nutritional anemia, unspecified: Secondary | ICD-10-CM

## 2015-07-06 DIAGNOSIS — K7030 Alcoholic cirrhosis of liver without ascites: Secondary | ICD-10-CM

## 2015-07-06 DIAGNOSIS — N289 Disorder of kidney and ureter, unspecified: Secondary | ICD-10-CM

## 2015-07-06 NOTE — Patient Instructions (Signed)
1. Please have your labs done as soon as possible. 2. Upper endoscopy, see separate instructions.

## 2015-07-06 NOTE — Progress Notes (Signed)
cc'ed to pcp °

## 2015-07-06 NOTE — Assessment & Plan Note (Signed)
Will require further imaging of left renal lesion via contrast CT or MRI. Await pending labs prior to scheduling (ie creatine).

## 2015-07-06 NOTE — Progress Notes (Signed)
Primary Care Physician:  ALLEN, DEBRA, NP  Primary Gastroenterologist:  Michael Rourk, MD   Chief Complaint  Patient presents with  . Follow-up    hospital cirrhosis    HPI:  [name] is a 59 y.o. female here for further evaluation of cirrhosis, anemia and Hemoccult-positive stools. Appointment made by hospitalist at time of discharge. Multiple hospitalizations over the past few months. Diagnosed with cirrhosis in the past few months. Most recent hospitalization within the past 2 weeks for UTI with possible early sepsis. She's had some problems with lower extremity cellulitis with admission at [facility] as well. Admitted in September 2016 with acute encephalopathy felt to be multifactorial related to dehydration, alcohol withdrawal, plus or minus alcoholic dementia. Ammonia level was normal. Patient states her mental status has completely normalized. Also had sepsis due to Escherichia coli UTI at the time. Aspiration pneumonia as well. She's had significant anemia with heme positive stool and reports transfusion some point in the past few monthsbut daughter is not clear on this.  Patient reports last alcohol intake around September 11. History of significant alcohol abuse in the past. Appetite slightly improved although daughter states she eats only a couple of bites of food at a time. 2-3 Ensures daily. Doesn't feel hungry. No n/v. Cannot swallow large pills. Has to crush. Swallows food okay as long she chews thoroughly. States symptoms not as bad as previously when required dilation.Bowel movement regular. No blood in the stool or melena per patient however daughter reports that she had some black stool recently. Has had some intermittent loose stools lately.     Current Outpatient Prescriptions  Medication Sig Dispense Refill  . albuterol (PROVENTIL HFA;VENTOLIN HFA) 108 (90 BASE) MCG/ACT inhaler Inhale 1-2 puffs into the lungs every 6 (six) hours as needed for wheezing. 1 Inhaler 0  .  cefUROXime (CEFTIN) 500 MG tablet Take 1 tablet (500 mg total) by mouth 2 (two) times daily with a meal. 11 tablet 0  . feeding supplement, ENSURE ENLIVE, (ENSURE ENLIVE) LIQD Take 237 mLs by mouth 2 (two) times daily between meals.    . folic acid (FOLVITE) 1 MG tablet Take 1 tablet (1 mg total) by mouth daily. 30 tablet 1  . metoprolol succinate (TOPROL-XL) 25 MG 24 hr tablet Take 1 tablet (25 mg total) by mouth daily. 30 tablet 1  . Multiple Vitamins-Minerals (MULTIVITAMIN) tablet Take 1 tablet by mouth daily.    . pantoprazole (PROTONIX) 40 MG tablet Take 1 tablet (40 mg total) by mouth daily with supper. 30 tablet 1  . saccharomyces boulardii (FLORASTOR) 250 MG capsule Take 1 capsule (250 mg total) by mouth 2 (two) times daily. 60 capsule 0   No current facility-administered medications for this visit.    Allergies as of 07/06/2015 - Review Complete 07/06/2015  Allergen Reaction Noted  . Loratadine Rash 03/21/2015    Past Medical History  Diagnosis Date  . Hypertension   . GERD (gastroesophageal reflux disease)   . Stroke (HCC) 2008    left sided weakness  . Alcohol abuse     quit 05/23/15  . Normal cardiac stress test 2008    myoview  . Hepatic steatosis   . Cholelithiasis   . Compression fracture     L1, fall in 05/2015  . Alcoholic dementia (HCC)     05/2015,??? patient reports mental status clear, possible had change related to DTs  . Alcoholic cirrhosis (HCC)     05/2015  . Anemia of chronic disease   .   Chronic GI bleeding   . Compression fracture of lumbar vertebra (HCC)   . Thrombocytopenia (HCC)   . Coagulopathy [facility])     Past Surgical History  Procedure Laterality Date  . Esophagogastroduodenoscopy  04/2007    RMR: tight peptic stricture status post dilation, antral erosions, superficial ulcerations, submucosal duodenal mass, gastric biopsy positive for H. pylori, duodenal biopsy benign, completed H. pylori treatment  . Appendectomy    . Colonoscopy      ???   . Esophagogastroduodenoscopy  07/2007    RMR: peptic stricture status post dilation, gastric erosions, 2 cm submucosal duodenal mass. Biopsies negative.referred for endoscopic ultrasound of submucosal duodenal mass but unclear whether this was completed.    Family History  Problem Relation Age of Onset  . Leukemia Mother   . Cirrhosis Paternal Uncle     etoh  . Colon cancer Neg Hx     Social History   Social History  . Marital Status: Unknown    Spouse Name: N/A  . Number of Children: 2  . Years of Education: N/A   Occupational History  . Not on file.   Social History Main Topics  . Smoking status: Current Every Day Smoker -- 1.50 packs/day for 40 years    Types: Cigarettes  . Smokeless tobacco: Not on file  . Alcohol Use: 12.0 oz/week    20 Shots of liquor per week     Comment: quit etoh 05/23/15. used to drink 1/2 gallon vodka in two days   . Drug Use: No  . Sexual Activity: Not on file   Other Topics Concern  . Not on file   Social History Narrative      ROS:  General: +anorexia and weight loss, weight down 10-15 pounds this year. Negative for fever, chills. + fatigue, weakness. Eyes: Negative for vision changes.  ENT: Negative for hoarseness,   nasal congestion. See hpi CV: Negative for chest pain, angina, palpitations, dyspnea on exertion, +peripheral edema.  Respiratory: Negative for dyspnea at rest, dyspnea on exertion, cough, sputum, wheezing.  GI: See history of present illness. GU:  Negative for dysuria, hematuria, urinary incontinence, urinary frequency, nocturnal urination.  MS: Negative for joint pain.+ low back pain.  Derm: Negative for rash or itching.  Neuro: Negative for weakness, abnormal sensation, seizure, frequent headaches, memory loss, confusion.  Psych: Negative for anxiety, depression, suicidal ideation, hallucinations.  Endo: see hpi  Heme: Negative for bruising or bleeding. Allergy: Negative for rash or hives.    Physical  Examination:  BP 120/80 mmHg  Pulse 117  Temp(Src) 97 F (36.1 C) (Oral)  Ht  (1.676 m)  Wt 127 lb 6.4 oz (57.788 kg)  BMI 20.57 kg/m2   General: chronically ill-appearing WF in NAD. Accompanied by daughter. Pale in appearance.   Head: Normocephalic, atraumatic.   Eyes: Conjunctiva pale, no icterus. Mouth: Oropharyngeal mucosa moist and pink , no lesions erythema or exudate. Neck: Supple without thyromegaly, masses, or lymphadenopathy.  Lungs: Clear to auscultation bilaterally.  Heart: Regular rate and rhythm, no murmurs rubs or gallops.  Abdomen: Bowel sounds are normal, nontender, nondistended, no hepatosplenomegaly or masses, no abdominal bruits or    hernia , no rebound or guarding.  Examined in chair. Patient unable to get on exam table after several attempts.  Rectal: not performed Extremities: left greater than right lower extremity edema, trace to 1+ on left. No clubbing or deformities.  Neuro: Alert and oriented x 4 , grossly normal neurologically.  Skin: Warm and  dry, no rash or jaundice.   Psych: Alert and cooperative, normal mood and affect.  Labs: Lab Results  Component Value Date   CREATININE 0.63 06/26/2015   BUN 7 06/26/2015   NA 141 06/26/2015   K 3.4* 06/26/2015   CL 111 06/26/2015   CO2 22 06/26/2015   Lab Results  Component Value Date   ALT 13* 06/26/2015   AST 41 06/26/2015   ALKPHOS 138* 06/26/2015   BILITOT 1.4* 06/26/2015   Lab Results  Component Value Date   INR 1.43 06/25/2015   INR 1.35 06/02/2015   INR 1.87* 05/26/2015   Lab Results  Component Value Date   WBC 6.5 06/26/2015   HGB 7.8* 06/26/2015   HCT 24.7* 06/26/2015   MCV 109.8* 06/26/2015   PLT 102* 06/26/2015   Lab Results  Component Value Date   VITAMINB12 616 05/27/2015   Lab Results  Component Value Date   FOLATE 13.5 05/25/2015   Lab Results  Component Value Date   IRON 102 05/25/2015   TIBC 112* 05/25/2015   FERRITIN 1363* 05/25/2015   03/2015: HCV Ab  negative, Hep B surf Ag: negative, Hep A igM negative, Hep B C IgM negative  Imaging Studies: Dg Chest 2 View  06/25/2015  CLINICAL DATA:  Fever.  Cough.  Wheezing.  Recent pneumonia. EXAM: CHEST  2 VIEW COMPARISON:  05/29/2015 FINDINGS: Artifact and arm position degradation involves the lateral view. Patient rotated to the left on the frontal. Midline trachea. Mild cardiomegaly. No pleural effusion or pneumothorax. Improved aeration. Mild atelectasis or scarring in the right mid lung on the frontal. Increased density projecting over the left lung base on the frontal is partially felt to be due to overlying EKG lead. IMPRESSION: Improved aeration. Cannot exclude residual or recurrent patchy atelectasis versus pneumonia at the left lung base. Cardiomegaly without congestive failure. Electronically Signed   By: Jeronimo GreavesKyle  Talbot M.D.   On: 06/25/2015 18:21   CT abdomen and pelvis without contrast on 05/25/2015, compared 2014 CT Hepatomegaly with diffuse fatty liver. Subcentimeter right hepatic hypodense lesions not characterized. Gallbladder filled with stones. Grossly stable left renal inferior pole cyst. 1.3 x 1.2 cm hypoattenuating lesions arising from the inferior pole of the left kidney and not characterized on noncontrast CT. Follow-up ultrasound or MRI recommended. Mild bilateral perinephric stranding. Sigmoid diverticulosis with muscular hypertrophy, submucosal fat deposit in the cecum and ascending colon likely sequelae of chronic inflammation, compression fracture of L1 vertebra with extension of the fracture through superior and inferior endplates.   Abdominal ultrasound July 2016, gallstones, fatty liver,cystic mass arising from lower pole of left kidney measuring 2.9 x 2.9 x 3.9 cm also on prior CT. Small solid-appearing mass arising from the lower pole of the left kidney seen on prior CT is not appreciable on the ultrasound today.CT or MR pre-and post IV contrast recommended.

## 2015-07-06 NOTE — Assessment & Plan Note (Signed)
F/u CBC now. Warning signs provided for which she should go to ER.

## 2015-07-06 NOTE — Assessment & Plan Note (Signed)
59 year old female with recent diagnosis of suspected chronic alcoholic liver disease. History of significant alcohol abuse until 05/23/2015. Last MELD calculation on 06/25/15 was 11. Multiple recent admissions for UTI plus or minus sepsis, aspiration pneumonia, lower extremity cellulitis. Developed acute encephalopathy during recent admission, likely multifactorial. Doubt alcoholic dementia as previously stated, given patient's normal mental state at this time. Likely was related to alcohol withdrawal symptoms, sepsis. Ammonia level was normal.  Recent imaging includes abdominal ultrasound and noncontrast CT of abdomen. Evidence of fatty liver. No obvious portal hypertension. Platelet count has been mild to moderately low. Significant hypoalbuminemia also in the setting of malnutrition. Cannot exclude underlying chronic liver disease.  Profound macrocytic anemia. Hemoccult-positive stool. Reported black stools. Upper endoscopy needed for further evaluation. Patient wants to hold off on colonoscopy at this time. Possibly remote colonoscopy done more than 10 years ago although we are not able to track down any records.  EGD in near future in OR with deep sedation given etoh abuse history. +/- esophageal dilation +/- esophageal variceal banding if present.  I have discussed the risks, alternatives, benefits with regards to but not limited to the risk of reaction to medication, bleeding, infection, perforation and the patient is agreeable to proceed. Written consent to be obtained.  Will check hep a and b immune status. Update liver labs.

## 2015-07-16 LAB — CBC WITH DIFFERENTIAL/PLATELET
BASOS PCT: 3 % — AB (ref 0–1)
Basophils Absolute: 0.2 10*3/uL — ABNORMAL HIGH (ref 0.0–0.1)
Eosinophils Absolute: 0.1 10*3/uL (ref 0.0–0.7)
Eosinophils Relative: 1 % (ref 0–5)
HCT: 33.4 % — ABNORMAL LOW (ref 36.0–46.0)
Hemoglobin: 10.6 g/dL — ABNORMAL LOW (ref 12.0–15.0)
LYMPHS ABS: 2.6 10*3/uL (ref 0.7–4.0)
Lymphocytes Relative: 37 % (ref 12–46)
MCH: 31.1 pg (ref 26.0–34.0)
MCHC: 31.7 g/dL (ref 30.0–36.0)
MCV: 97.9 fL (ref 78.0–100.0)
MONO ABS: 0.4 10*3/uL (ref 0.1–1.0)
MONOS PCT: 6 % (ref 3–12)
MPV: 14 fL — ABNORMAL HIGH (ref 8.6–12.4)
NEUTROS PCT: 53 % (ref 43–77)
Neutro Abs: 3.8 10*3/uL (ref 1.7–7.7)
PLATELETS: 141 10*3/uL — AB (ref 150–400)
RBC: 3.41 MIL/uL — ABNORMAL LOW (ref 3.87–5.11)
RDW: 16.1 % — ABNORMAL HIGH (ref 11.5–15.5)
WBC: 7.1 10*3/uL (ref 4.0–10.5)

## 2015-07-16 LAB — COMPREHENSIVE METABOLIC PANEL
ALBUMIN: 2.2 g/dL — AB (ref 3.6–5.1)
ALK PHOS: 140 U/L — AB (ref 33–130)
ALT: 27 U/L (ref 6–29)
AST: 83 U/L — ABNORMAL HIGH (ref 10–35)
BUN: 6 mg/dL — AB (ref 7–25)
CO2: 24 mmol/L (ref 20–31)
CREATININE: 0.57 mg/dL (ref 0.50–1.05)
Calcium: 8.8 mg/dL (ref 8.6–10.4)
Chloride: 106 mmol/L (ref 98–110)
Glucose, Bld: 96 mg/dL (ref 65–99)
Potassium: 4.2 mmol/L (ref 3.5–5.3)
SODIUM: 136 mmol/L (ref 135–146)
TOTAL PROTEIN: 6.7 g/dL (ref 6.1–8.1)
Total Bilirubin: 1.4 mg/dL — ABNORMAL HIGH (ref 0.2–1.2)

## 2015-07-16 LAB — PROTIME-INR
INR: 1.31 (ref <1.50)
Prothrombin Time: 16.5 seconds — ABNORMAL HIGH (ref 11.6–15.2)

## 2015-07-16 LAB — HEPATITIS B SURFACE ANTIBODY,QUALITATIVE: Hep B S Ab: NEGATIVE

## 2015-07-16 LAB — HEPATITIS A ANTIBODY, TOTAL: Hep A Total Ab: NONREACTIVE

## 2015-07-19 LAB — TISSUE TRANSGLUTAMINASE, IGA: TISSUE TRANSGLUTAMINASE AB, IGA: 1 U/mL (ref <4)

## 2015-07-20 ENCOUNTER — Encounter (HOSPITAL_COMMUNITY): Admission: RE | Admit: 2015-07-20 | Payer: Self-pay | Source: Ambulatory Visit

## 2015-07-22 NOTE — Progress Notes (Signed)
Quick Note:  She needs vaccinations for Hep A and B.  Other labs stable. Hgb up. EGD as planned. ______

## 2015-07-26 ENCOUNTER — Ambulatory Visit (HOSPITAL_COMMUNITY): Payer: Medicaid Other | Admitting: Anesthesiology

## 2015-07-26 ENCOUNTER — Ambulatory Visit (HOSPITAL_COMMUNITY)
Admission: RE | Admit: 2015-07-26 | Discharge: 2015-07-26 | Disposition: A | Discharge status: Home / Self Care | Payer: Medicaid Other | Source: Ambulatory Visit | Attending: Internal Medicine | Admitting: Internal Medicine

## 2015-07-26 ENCOUNTER — Encounter (HOSPITAL_COMMUNITY)
Admission: RE | Disposition: A | Discharge status: Home / Self Care | Payer: Self-pay | Source: Ambulatory Visit | Attending: Internal Medicine

## 2015-07-26 DIAGNOSIS — F17210 Nicotine dependence, cigarettes, uncomplicated: Secondary | ICD-10-CM | POA: Insufficient documentation

## 2015-07-26 DIAGNOSIS — D696 Thrombocytopenia, unspecified: Secondary | ICD-10-CM | POA: Insufficient documentation

## 2015-07-26 DIAGNOSIS — K639 Disease of intestine, unspecified: Secondary | ICD-10-CM | POA: Diagnosis not present

## 2015-07-26 DIAGNOSIS — R1310 Dysphagia, unspecified: Secondary | ICD-10-CM | POA: Insufficient documentation

## 2015-07-26 DIAGNOSIS — K222 Esophageal obstruction: Secondary | ICD-10-CM | POA: Insufficient documentation

## 2015-07-26 DIAGNOSIS — D689 Coagulation defect, unspecified: Secondary | ICD-10-CM | POA: Diagnosis not present

## 2015-07-26 DIAGNOSIS — K760 Fatty (change of) liver, not elsewhere classified: Secondary | ICD-10-CM | POA: Insufficient documentation

## 2015-07-26 DIAGNOSIS — Z79899 Other long term (current) drug therapy: Secondary | ICD-10-CM | POA: Insufficient documentation

## 2015-07-26 DIAGNOSIS — K449 Diaphragmatic hernia without obstruction or gangrene: Secondary | ICD-10-CM | POA: Diagnosis not present

## 2015-07-26 DIAGNOSIS — K219 Gastro-esophageal reflux disease without esophagitis: Secondary | ICD-10-CM | POA: Diagnosis not present

## 2015-07-26 DIAGNOSIS — I10 Essential (primary) hypertension: Secondary | ICD-10-CM | POA: Insufficient documentation

## 2015-07-26 DIAGNOSIS — K7030 Alcoholic cirrhosis of liver without ascites: Secondary | ICD-10-CM | POA: Diagnosis not present

## 2015-07-26 DIAGNOSIS — K2970 Gastritis, unspecified, without bleeding: Secondary | ICD-10-CM | POA: Diagnosis not present

## 2015-07-26 DIAGNOSIS — K2289 Other specified disease of esophagus: Secondary | ICD-10-CM | POA: Insufficient documentation

## 2015-07-26 DIAGNOSIS — K3189 Other diseases of stomach and duodenum: Secondary | ICD-10-CM | POA: Insufficient documentation

## 2015-07-26 DIAGNOSIS — K229 Disease of esophagus, unspecified: Secondary | ICD-10-CM

## 2015-07-26 HISTORY — PX: ESOPHAGOGASTRODUODENOSCOPY (EGD) WITH PROPOFOL: SHX5813

## 2015-07-26 HISTORY — PX: BIOPSY: SHX5522

## 2015-07-26 SURGERY — ESOPHAGOGASTRODUODENOSCOPY (EGD) WITH PROPOFOL
Anesthesia: Monitor Anesthesia Care

## 2015-07-26 MED ORDER — PROPOFOL 10 MG/ML IV BOLUS
INTRAVENOUS | Status: DC | PRN
  Administered 2015-07-26 (×2): 11 mg via INTRAVENOUS

## 2015-07-26 MED ORDER — LIDOCAINE VISCOUS 2 % MT SOLN
OROMUCOSAL | Status: AC
  Filled 2015-07-26: qty 15

## 2015-07-26 MED ORDER — FENTANYL CITRATE (PF) 100 MCG/2ML IJ SOLN: 25.0000 ug | INTRAMUSCULAR | Status: DC | PRN

## 2015-07-26 MED ORDER — LACTATED RINGERS IV SOLN
INTRAVENOUS | Status: DC
  Administered 2015-07-26: 11:00:00 via INTRAVENOUS

## 2015-07-26 MED ORDER — SIMETHICONE 40 MG/0.6ML PO SUSP
ORAL | Status: DC | PRN
  Administered 2015-07-26: 1000 mL

## 2015-07-26 MED ORDER — LIDOCAINE VISCOUS 2 % MT SOLN
15.0000 mL | Freq: Once | OROMUCOSAL | Status: AC
  Administered 2015-07-26: 15 mL via OROMUCOSAL

## 2015-07-26 MED ORDER — MIDAZOLAM HCL 2 MG/2ML IJ SOLN
1.0000 mg | INTRAMUSCULAR | Status: DC | PRN
  Administered 2015-07-26 (×2): 2 mg via INTRAVENOUS

## 2015-07-26 MED ORDER — PROPOFOL 10 MG/ML IV BOLUS
INTRAVENOUS | Status: AC
  Filled 2015-07-26: qty 20

## 2015-07-26 MED ORDER — MIDAZOLAM HCL 2 MG/2ML IJ SOLN
INTRAMUSCULAR | Status: AC
  Filled 2015-07-26: qty 2

## 2015-07-26 MED ORDER — LIDOCAINE HCL (CARDIAC) 10 MG/ML IV SOLN
INTRAVENOUS | Status: DC | PRN
  Administered 2015-07-26: 50 mg via INTRAVENOUS

## 2015-07-26 MED ORDER — ONDANSETRON HCL 4 MG/2ML IJ SOLN: 4.0000 mg | Freq: Once | INTRAMUSCULAR | Status: DC | PRN

## 2015-07-26 MED ORDER — PROPOFOL 500 MG/50ML IV EMUL
INTRAVENOUS | Status: DC | PRN
  Administered 2015-07-26: 100 ug/kg/min via INTRAVENOUS

## 2015-07-26 SURGICAL SUPPLY — 35 items
BALLN CRE LF 10-12 240X5.5 (BALLOONS)
BALLN CRE LF 10-12MM 240X5.5 (BALLOONS)
BALLN DILATOR CRE 12-15 240 (BALLOONS)
BALLN DILATOR CRE 15-18 240 (BALLOONS) IMPLANT
BALLN DILATOR CRE 18-20 240 (BALLOONS) IMPLANT
BALLN DILATOR CRE WIREGUIDE (BALLOONS)
BALLOON CRE LF 10-12 240X5.5 (BALLOONS) IMPLANT
BALLOON DILATOR CRE 12-15 240 (BALLOONS) IMPLANT
BALLOON DILATOR CRE WIREGUIDE (BALLOONS) IMPLANT
BLOCK BITE 60FR ADLT L/F BLUE (MISCELLANEOUS) ×3 IMPLANT
DEVICE CLIP HEMOSTAT 235CM (CLIP) IMPLANT
ELECT REM PT RETURN 9FT ADLT (ELECTROSURGICAL)
ELECTRODE REM PT RTRN 9FT ADLT (ELECTROSURGICAL) IMPLANT
FLOOR PAD 36X40 (MISCELLANEOUS) ×3
FORCEP COLD BIOPSY (CUTTING FORCEPS) IMPLANT
FORCEP RJ3 GP 1.8X160 W-NEEDLE (CUTTING FORCEPS) IMPLANT
FORCEPS BIOP RAD 4 LRG CAP 4 (CUTTING FORCEPS) ×2 IMPLANT
FORMALIN 10 PREFIL 20ML (MISCELLANEOUS) ×4 IMPLANT
KIT ENDO PROCEDURE PEN (KITS) ×3 IMPLANT
MANIFOLD NEPTUNE II (INSTRUMENTS) ×2 IMPLANT
MANIFOLD NEPTUNE WASTE (CANNULA) IMPLANT
NDL SCLEROTHERAPY 25GX240 (NEEDLE) IMPLANT
NEEDLE SCLEROTHERAPY 25GX240 (NEEDLE) IMPLANT
PAD FLOOR 36X40 (MISCELLANEOUS) IMPLANT
PROBE APC STR FIRE (PROBE) IMPLANT
PROBE INJECTION GOLD (MISCELLANEOUS)
PROBE INJECTION GOLD 7FR (MISCELLANEOUS) IMPLANT
SNARE ROTATE MED OVAL 20MM (MISCELLANEOUS) IMPLANT
SNARE SHORT THROW 13M SML OVAL (MISCELLANEOUS) ×1 IMPLANT
SYR 50ML LL SCALE MARK (SYRINGE) ×2 IMPLANT
SYR INFLATE BILIARY GAUGE (MISCELLANEOUS) IMPLANT
SYR INFLATION 60ML (SYRINGE) ×1 IMPLANT
TUBING INSUFFLATOR CO2MPACT (TUBING) ×3 IMPLANT
TUBING IRRIGATION ENDOGATOR (MISCELLANEOUS) ×3 IMPLANT
WATER STERILE IRR 1000ML POUR (IV SOLUTION) ×2 IMPLANT

## 2015-07-26 NOTE — Discharge Instructions (Signed)
EGD Discharge instructions Please read the instructions outlined below and refer to this sheet in the next few weeks. These discharge instructions provide you with general information on caring for yourself after you leave the hospital. Your doctor may also give you specific instructions. While your treatment has been planned according to the most current medical practices available, unavoidable complications occasionally occur. If you have any problems or questions after discharge, please call your doctor. ACTIVITY  You may resume your regular activity but move at a slower pace for the next 24 hours.   Take frequent rest periods for the next 24 hours.   Walking will help expel (get rid of) the air and reduce the bloated feeling in your abdomen.   No driving for 24 hours (because of the anesthesia (medicine) used during the test).   You may shower.   Do not sign any important legal documents or operate any machinery for 24 hours (because of the anesthesia used during the test).  NUTRITION  Drink plenty of fluids.   You may resume your normal diet.   Begin with a light meal and progress to your normal diet.   Avoid alcoholic beverages for 24 hours or as instructed by your caregiver.  MEDICATIONS  You may resume your normal medications unless your caregiver tells you otherwise.  WHAT YOU CAN EXPECT TODAY  You may experience abdominal discomfort such as a feeling of fullness or gas pains.  FOLLOW-UP  Your doctor will discuss the results of your test with you.  SEEK IMMEDIATE MEDICAL ATTENTION IF ANY OF THE FOLLOWING OCCUR:  Excessive nausea (feeling sick to your stomach) and/or vomiting.   Severe abdominal pain and distention (swelling).   Trouble swallowing.   Temperature over 101 F (37.8 C).   Rectal bleeding or vomiting of blood.      GERD information provided  Increase Protonix to 40 mg twice daily  Further recommendations to follow pending review of  pathology report  Office visit with us in 6 weeks  Gastroesophageal Reflux Disease, Adult Normally, food travels down the esophagus and stays in the stomach to be digested. However, when a person has gastroesophageal reflux disease (GERD), food and stomach acid move back up into the esophagus. When this happens, the esophagus becomes sore and inflamed. Over time, GERD can create small holes (ulcers) in the lining of the esophagus.  CAUSES This condition is caused by a problem with the muscle between the esophagus and the stomach (lower esophageal sphincter, or LES). Normally, the LES muscle closes after food passes through the esophagus to the stomach. When the LES is weakened or abnormal, it does not close properly, and that allows food and stomach acid to go back up into the esophagus. The LES can be weakened by certain dietary substances, medicines, and medical conditions, including:  Tobacco use.  Pregnancy.  Having a hiatal hernia.  Heavy alcohol use.  Certain foods and beverages, such as coffee, chocolate, onions, and peppermint. RISK FACTORS This condition is more likely to develop in:  People who have an increased body weight.  People who have connective tissue disorders.  People who use NSAID medicines. SYMPTOMS Symptoms of this condition include:  Heartburn.  Difficult or painful swallowing.  The feeling of having a lump in the throat.  Abitter taste in the mouth.  Bad breath.  Having a large amount of saliva.  Having an upset or bloated stomach.  Belching.  Chest pain.  Shortness of breath or wheezing.  Ongoing (chronic)  cough or a night-time cough.  Wearing away of tooth enamel.  Weight loss. Different conditions can cause chest pain. Make sure to see your health care provider if you experience chest pain. DIAGNOSIS Your health care provider will take a medical history and perform a physical exam. To determine if you have mild or severe GERD, your  health care provider may also monitor how you respond to treatment. You may also have other tests, including:  An endoscopy toexamine your stomach and esophagus with a small camera.  A test thatmeasures the acidity level in your esophagus.  A test thatmeasures how much pressure is on your esophagus.  A barium swallow or modified barium swallow to show the shape, size, and functioning of your esophagus. TREATMENT The goal of treatment is to help relieve your symptoms and to prevent complications. Treatment for this condition may vary depending on how severe your symptoms are. Your health care provider may recommend:  Changes to your diet.  Medicine.  Surgery. HOME CARE INSTRUCTIONS Diet  Follow a diet as recommended by your health care provider. This may involve avoiding foods and drinks such as:  Coffee and tea (with or without caffeine).  Drinks that containalcohol.  Energy drinks and sports drinks.  Carbonated drinks or sodas.  Chocolate and cocoa.  Peppermint and mint flavorings.  Garlic and onions.  Horseradish.  Spicy and acidic foods, including peppers, chili powder, curry powder, vinegar, hot sauces, and barbecue sauce.  Citrus fruit juices and citrus fruits, such as oranges, lemons, and limes.  Tomato-based foods, such as red sauce, chili, salsa, and pizza with red sauce.  Fried and fatty foods, such as donuts, french fries, potato chips, and high-fat dressings.  High-fat meats, such as hot dogs and fatty cuts of red and white meats, such as rib eye steak, sausage, ham, and bacon.  High-fat dairy items, such as whole milk, butter, and cream cheese.  Eat small, frequent meals instead of large meals.  Avoid drinking large amounts of liquid with your meals.  Avoid eating meals during the 2-3 hours before bedtime.  Avoid lying down right after you eat.  Do not exercise right after you eat. General Instructions  Pay attention to any changes in  your symptoms.  Take over-the-counter and prescription medicines only as told by your health care provider. Do not take aspirin, ibuprofen, or other NSAIDs unless your health care provider told you to do so.  Do not use any tobacco products, including cigarettes, chewing tobacco, and e-cigarettes. If you need help quitting, ask your health care provider.  Wear loose-fitting clothing. Do not wear anything tight around your waist that causes pressure on your abdomen.  Raise (elevate) the head of your bed 6 inches (15cm).  Try to reduce your stress, such as with yoga or meditation. If you need help reducing stress, ask your health care provider.  If you are overweight, reduce your weight to an amount that is healthy for you. Ask your health care provider for guidance about a safe weight loss goal.  Keep all follow-up visits as told by your health care provider. This is important. SEEK MEDICAL CARE IF:  You have new symptoms.  You have unexplained weight loss.  You have difficulty swallowing, or it hurts to swallow.  You have wheezing or a persistent cough.  Your symptoms do not improve with treatment.  You have a hoarse voice. SEEK IMMEDIATE MEDICAL CARE IF:  You have pain in your arms, neck, jaw, teeth, or back.  You feel sweaty, dizzy, or light-headed.  You have chest pain or shortness of breath.  You vomit and your vomit looks like blood or coffee grounds.  You faint.  Your stool is bloody or black.  You cannot swallow, drink, or eat.   This information is not intended to replace advice given to you by your health care provider. Make sure you discuss any questions you have with your health care provider.   Document Released: 06/07/2005 Document Revised: 05/19/2015 Document Reviewed: 12/23/2014 Elsevier Interactive Patient Education [employer]

## 2015-07-26 NOTE — Progress Notes (Signed)
Awake. Needs to go to bathroom. Up to BR. Tolerated well.

## 2015-07-26 NOTE — Anesthesia Procedure Notes (Signed)
Procedure Name: MAC Date/Time: 07/26/2015 12:42 PM Performed by: Franco NonesYATES, Ghassan Coggeshall S Pre-anesthesia Checklist: Patient identified, Emergency Drugs available, Suction available, Timeout performed and Patient being monitored Patient Re-evaluated:Patient Re-evaluated prior to inductionOxygen Delivery Method: Non-rebreather mask

## 2015-07-26 NOTE — H&P (View-Only) (Signed)
Primary Care Physician:  Vertis Kelch, NP  Primary Gastroenterologist:  Roetta Sessions, MD   Chief Complaint  Patient presents with  . Follow-up    hospital cirrhosis    HPI:  [name] is a 59 y.o. female here for further evaluation of cirrhosis, anemia and Hemoccult-positive stools. Appointment made by hospitalist at time of discharge. Multiple hospitalizations over the past few months. Diagnosed with cirrhosis in the past few months. Most recent hospitalization within the past 2 weeks for UTI with possible early sepsis. She's had some problems with lower extremity cellulitis with admission at [facility] as well. Admitted in September 2016 with acute encephalopathy felt to be multifactorial related to dehydration, alcohol withdrawal, plus or minus alcoholic dementia. Ammonia level was normal. Patient states her mental status has completely normalized. Also had sepsis due to Escherichia coli UTI at the time. Aspiration pneumonia as well. She's had significant anemia with heme positive stool and reports transfusion some point in the past few monthsbut daughter is not clear on this.  Patient reports last alcohol intake around September 11. History of significant alcohol abuse in the past. Appetite slightly improved although daughter states she eats only a couple of bites of food at a time. 2-3 Ensures daily. Doesn't feel hungry. No n/v. Cannot swallow large pills. Has to crush. Swallows food okay as long she chews thoroughly. States symptoms not as bad as previously when required dilation.Bowel movement regular. No blood in the stool or melena per patient however daughter reports that she had some black stool recently. Has had some intermittent loose stools lately.     Current Outpatient Prescriptions  Medication Sig Dispense Refill  . albuterol (PROVENTIL HFA;VENTOLIN HFA) 108 (90 BASE) MCG/ACT inhaler Inhale 1-2 puffs into the lungs every 6 (six) hours as needed for wheezing. 1 Inhaler 0  .  cefUROXime (CEFTIN) 500 MG tablet Take 1 tablet (500 mg total) by mouth 2 (two) times daily with a meal. 11 tablet 0  . feeding supplement, ENSURE ENLIVE, (ENSURE ENLIVE) LIQD Take 237 mLs by mouth 2 (two) times daily between meals.    . folic acid (FOLVITE) 1 MG tablet Take 1 tablet (1 mg total) by mouth daily. 30 tablet 1  . metoprolol succinate (TOPROL-XL) 25 MG 24 hr tablet Take 1 tablet (25 mg total) by mouth daily. 30 tablet 1  . Multiple Vitamins-Minerals (MULTIVITAMIN) tablet Take 1 tablet by mouth daily.    . pantoprazole (PROTONIX) 40 MG tablet Take 1 tablet (40 mg total) by mouth daily with supper. 30 tablet 1  . saccharomyces boulardii (FLORASTOR) 250 MG capsule Take 1 capsule (250 mg total) by mouth 2 (two) times daily. 60 capsule 0   No current facility-administered medications for this visit.    Allergies as of 07/06/2015 - Review Complete 07/06/2015  Allergen Reaction Noted  . Loratadine Rash 03/21/2015    Past Medical History  Diagnosis Date  . Hypertension   . GERD (gastroesophageal reflux disease)   . Stroke [facility]) 2008    left sided weakness  . Alcohol abuse     quit 05/23/15  . Normal cardiac stress test 2008    myoview  . Hepatic steatosis   . Cholelithiasis   . Compression fracture     L1, fall in 05/2015  . Alcoholic dementia (HCC)     05/2015,??? patient reports mental status clear, possible had change related to DTs  . Alcoholic cirrhosis (HCC)     05/2015  . Anemia of chronic disease   .  Chronic GI bleeding   . Compression fracture of lumbar vertebra (HCC)   . Thrombocytopenia (HCC)   . Coagulopathy [facility])     Past Surgical History  Procedure Laterality Date  . Esophagogastroduodenoscopy  04/2007    RMR: tight peptic stricture status post dilation, antral erosions, superficial ulcerations, submucosal duodenal mass, gastric biopsy positive for H. pylori, duodenal biopsy benign, completed H. pylori treatment  . Appendectomy    . Colonoscopy      ???   . Esophagogastroduodenoscopy  07/2007    RMR: peptic stricture status post dilation, gastric erosions, 2 cm submucosal duodenal mass. Biopsies negative.referred for endoscopic ultrasound of submucosal duodenal mass but unclear whether this was completed.    Family History  Problem Relation Age of Onset  . Leukemia Mother   . Cirrhosis Paternal Uncle     etoh  . Colon cancer Neg Hx     Social History   Social History  . Marital Status: Unknown    Spouse Name: N/A  . Number of Children: 2  . Years of Education: N/A   Occupational History  . Not on file.   Social History Main Topics  . Smoking status: Current Every Day Smoker -- 1.50 packs/day for 40 years    Types: Cigarettes  . Smokeless tobacco: Not on file  . Alcohol Use: 12.0 oz/week    20 Shots of liquor per week     Comment: quit etoh 05/23/15. used to drink 1/2 gallon vodka in two days   . Drug Use: No  . Sexual Activity: Not on file   Other Topics Concern  . Not on file   Social History Narrative      ROS:  General: +anorexia and weight loss, weight down 10-15 pounds this year. Negative for fever, chills. + fatigue, weakness. Eyes: Negative for vision changes.  ENT: Negative for hoarseness,   nasal congestion. See hpi CV: Negative for chest pain, angina, palpitations, dyspnea on exertion, +peripheral edema.  Respiratory: Negative for dyspnea at rest, dyspnea on exertion, cough, sputum, wheezing.  GI: See history of present illness. GU:  Negative for dysuria, hematuria, urinary incontinence, urinary frequency, nocturnal urination.  MS: Negative for joint pain.+ low back pain.  Derm: Negative for rash or itching.  Neuro: Negative for weakness, abnormal sensation, seizure, frequent headaches, memory loss, confusion.  Psych: Negative for anxiety, depression, suicidal ideation, hallucinations.  Endo: see hpi  Heme: Negative for bruising or bleeding. Allergy: Negative for rash or hives.    Physical  Examination:  BP 120/80 mmHg  Pulse 117  Temp(Src) 97 F (36.1 C) (Oral)  Ht  (1.676 m)  Wt 127 lb 6.4 oz (57.788 kg)  BMI 20.57 kg/m2   General: chronically ill-appearing WF in NAD. Accompanied by daughter. Pale in appearance.   Head: Normocephalic, atraumatic.   Eyes: Conjunctiva pale, no icterus. Mouth: Oropharyngeal mucosa moist and pink , no lesions erythema or exudate. Neck: Supple without thyromegaly, masses, or lymphadenopathy.  Lungs: Clear to auscultation bilaterally.  Heart: Regular rate and rhythm, no murmurs rubs or gallops.  Abdomen: Bowel sounds are normal, nontender, nondistended, no hepatosplenomegaly or masses, no abdominal bruits or    hernia , no rebound or guarding.  Examined in chair. Patient unable to get on exam table after several attempts.  Rectal: not performed Extremities: left greater than right lower extremity edema, trace to 1+ on left. No clubbing or deformities.  Neuro: Alert and oriented x 4 , grossly normal neurologically.  Skin: Warm and  dry, no rash or jaundice.   Psych: Alert and cooperative, normal mood and affect.  Labs: Lab Results  Component Value Date   CREATININE 0.63 06/26/2015   BUN 7 06/26/2015   NA 141 06/26/2015   K 3.4* 06/26/2015   CL 111 06/26/2015   CO2 22 06/26/2015   Lab Results  Component Value Date   ALT 13* 06/26/2015   AST 41 06/26/2015   ALKPHOS 138* 06/26/2015   BILITOT 1.4* 06/26/2015   Lab Results  Component Value Date   INR 1.43 06/25/2015   INR 1.35 06/02/2015   INR 1.87* 05/26/2015   Lab Results  Component Value Date   WBC 6.5 06/26/2015   HGB 7.8* 06/26/2015   HCT 24.7* 06/26/2015   MCV 109.8* 06/26/2015   PLT 102* 06/26/2015   Lab Results  Component Value Date   VITAMINB12 616 05/27/2015   Lab Results  Component Value Date   FOLATE 13.5 05/25/2015   Lab Results  Component Value Date   IRON 102 05/25/2015   TIBC 112* 05/25/2015   FERRITIN 1363* 05/25/2015   03/2015: HCV Ab  negative, Hep B surf Ag: negative, Hep A igM negative, Hep B C IgM negative  Imaging Studies: Dg Chest 2 View  06/25/2015  CLINICAL DATA:  Fever.  Cough.  Wheezing.  Recent pneumonia. EXAM: CHEST  2 VIEW COMPARISON:  05/29/2015 FINDINGS: Artifact and arm position degradation involves the lateral view. Patient rotated to the left on the frontal. Midline trachea. Mild cardiomegaly. No pleural effusion or pneumothorax. Improved aeration. Mild atelectasis or scarring in the right mid lung on the frontal. Increased density projecting over the left lung base on the frontal is partially felt to be due to overlying EKG lead. IMPRESSION: Improved aeration. Cannot exclude residual or recurrent patchy atelectasis versus pneumonia at the left lung base. Cardiomegaly without congestive failure. Electronically Signed   By: Jeronimo GreavesKyle  Talbot M.D.   On: 06/25/2015 18:21   CT abdomen and pelvis without contrast on 05/25/2015, compared 2014 CT Hepatomegaly with diffuse fatty liver. Subcentimeter right hepatic hypodense lesions not characterized. Gallbladder filled with stones. Grossly stable left renal inferior pole cyst. 1.3 x 1.2 cm hypoattenuating lesions arising from the inferior pole of the left kidney and not characterized on noncontrast CT. Follow-up ultrasound or MRI recommended. Mild bilateral perinephric stranding. Sigmoid diverticulosis with muscular hypertrophy, submucosal fat deposit in the cecum and ascending colon likely sequelae of chronic inflammation, compression fracture of L1 vertebra with extension of the fracture through superior and inferior endplates.   Abdominal ultrasound July 2016, gallstones, fatty liver,cystic mass arising from lower pole of left kidney measuring 2.9 x 2.9 x 3.9 cm also on prior CT. Small solid-appearing mass arising from the lower pole of the left kidney seen on prior CT is not appreciable on the ultrasound today.CT or MR pre-and post IV contrast recommended.

## 2015-07-26 NOTE — Anesthesia Postprocedure Evaluation (Signed)
  Anesthesia Post-op Note  Patient: [name]  Procedure(s) Performed: Procedure(s) with comments: ESOPHAGOGASTRODUODENOSCOPY (EGD) WITH PROPOFOL (N/A) - 1015 - moved to 11:45 - office notified pt BIOPSY (N/A) - Gastric, Esophageal  Patient Location: PACU  Anesthesia Type:MAC  Level of Consciousness: awake, alert  and oriented  Airway and Oxygen Therapy: Patient Spontanous Breathing  Post-op Pain: none  Post-op Assessment: Post-op Vital signs reviewed, Patient's Cardiovascular Status Stable, Respiratory Function Stable, Patent Airway and No signs of Nausea or vomiting              Post-op Vital Signs: Reviewed and stable  Last Vitals:  Filed Vitals:   07/26/15 1240  BP: 120/73  Pulse:   Temp:   Resp: 20    Complications: No apparent anesthesia complications

## 2015-07-26 NOTE — Interval H&P Note (Signed)
History and Physical Interval Note:  07/26/2015 12:20 PM  [name]  has presented today for surgery, with the diagnosis of melena, screening for varices, dysphagia  The various methods of treatment have been discussed with the patient and family. After consideration of risks, benefits and other options for treatment, the patient has consented to  Procedure(s) with comments: ESOPHAGOGASTRODUODENOSCOPY (EGD) WITH PROPOFOL (N/A) - 1015 - moved to 11:45 - office notified pt ESOPHAGEAL BANDING (N/A) ESOPHAGEAL DILATION (N/A) as a surgical intervention .  The patient's history has been reviewed, patient examined, no change in status, stable for surgery.  I have reviewed the patient's chart and labs.  Questions were answered to the patient's satisfaction.     [name]  No change.  EGD per plan. The risks, benefits, limitations, alternatives and imponderables have been reviewed with the patient. Potential for esophageal dilation, biopsy, etc. have also been reviewed.  Questions have been answered. All parties agreeable.

## 2015-07-26 NOTE — Addendum Note (Signed)
Addendum  created 07/26/15 1321 by Moshe SalisburyKaren E Cina Klumpp, CRNA   Modules edited: Charges VN

## 2015-07-26 NOTE — Anesthesia Preprocedure Evaluation (Signed)
Anesthesia Evaluation  Patient identified by MRN, date of birth, ID band Patient awake    Reviewed: Allergy & Precautions, NPO status , Patient's Chart, lab work & pertinent test results, reviewed documented beta blocker date and time   Airway Mallampati: II  TM Distance: <3 FB   Mouth opening: Limited Mouth Opening  Dental  (+) Missing, Dental Advisory Given   Pulmonary pneumonia, resolved, Current Smoker,    Pulmonary exam normal        Cardiovascular hypertension, Pt. on medications Normal cardiovascular exam     Neuro/Psych    GI/Hepatic GERD  Medicated,(+) Cirrhosis   Esophageal Varices  substance abuse  alcohol use,   Endo/Other    Renal/GU Renal InsufficiencyRenal disease     Musculoskeletal   Abdominal Normal abdominal exam  (+)   Peds  Hematology  (+) anemia ,   Anesthesia Other Findings   Reproductive/Obstetrics                             Anesthesia Physical Anesthesia Plan  ASA: IV  Anesthesia Plan: MAC   Post-op Pain Management:    Induction: Intravenous  Airway Management Planned: Simple Face Mask  Additional Equipment:   Intra-op Plan:   Post-operative Plan:   Informed Consent: I have reviewed the patients History and Physical, chart, labs and discussed the procedure including the risks, benefits and alternatives for the proposed anesthesia with the patient or authorized representative who has indicated his/her understanding and acceptance.   Dental advisory given  Plan Discussed with: CRNA  Anesthesia Plan Comments:         Anesthesia Quick Evaluation

## 2015-07-26 NOTE — Transfer of Care (Signed)
Immediate Anesthesia Transfer of Care Note  Patient: Debbie ElseDeborah C Valentine  Procedure(s) Performed: Procedure(s) with comments: ESOPHAGOGASTRODUODENOSCOPY (EGD) WITH PROPOFOL (N/A) - 1015 - moved to 11:45 - office notified pt BIOPSY (N/A) - Gastric, Esophageal  Patient Location: PACU  Anesthesia Type:MAC  Level of Consciousness: awake and alert   Airway & Oxygen Therapy: Patient Spontanous Breathing and Patient connected to face mask oxygen  Post-op Assessment: Report given to RN  Post vital signs: Reviewed and stable  Last Vitals:  Filed Vitals:   07/26/15 1240  BP: 120/73  Pulse:   Temp:   Resp: 20    Complications: No apparent anesthesia complications

## 2015-07-27 ENCOUNTER — Encounter (HOSPITAL_COMMUNITY): Payer: Self-pay | Admitting: Internal Medicine

## 2015-07-27 NOTE — Op Note (Addendum)
[facility] [address] [place], [id number]   ENDOSCOPY PROCEDURE REPORT  PATIENT: [name], [name]  MR#: [id number] BIRTHDATE: Jan 13, 1956 , 59  yrs. old GENDER: female ENDOSCOPIST: R.  Roetta SessionsMichael Huntleigh Doolen, MD [facility] HospitalFACP FACG REFERRED BY:  Kizzie Furnishochelle Muse, [place] PROCEDURE DATE:  07/26/2015 PROCEDURE:  EGD w/ biopsy INDICATIONS:  esophageal dysphagia; screening for esophageal varices. MEDICATIONS: Deep sedation per Dr.  [facility] ASA CLASS:      Class II  CONSENT: The risks, benefits, limitations, alternatives and imponderables have been discussed.  The potential for biopsy, esophogeal dilation, etc. have also been reviewed.  Questions have been answered.  All parties agreeable.  Please see the history and physical in the medical record for more information.  DESCRIPTION OF PROCEDURE: After the risks benefits and alternatives of the procedure were thoroughly explained, informed consent was obtained.  The    endoscope was introduced through the mouth and advanced to the second portion of the duodenum , limited by Without limitations. The instrument was slowly withdrawn as the mucosa was fully examined. Estimated blood loss is zero unless otherwise noted in this procedure report.    Somewhat "ringed" appearing tubular esophagus.  Short benign-appearing fibrotic stricture GE junction.  Mucosal friable at this level.  No esophageal varices.  No esophagitis otherwise. No Barrett's epithelium seen.  Stricture opened up nicely with passage of the scope.  Distal esophageal mucosal tissues somewhat crep-paper.  Stomach empty.  2 cm hiatal hernia.  No ulcer or infiltrating process.  Diffusely scarlatiniform appearing gastric mucosa.  Patent pylorus.  Examination first and second portion of the duodenum revealed a large conelike submucosal structure about 2.5 cm in length and 8-9 mm in width.  Please see photos.   Biopsies of the mid and distal esophagus taken.   Biopsies abnormal appearing gastric mucosa taken.  Duodenal mass not manipulated. Retroflexed views revealed as previously described.     The scope was then withdrawn from the patient and the procedure completed.  COMPLICATIONS: There were no immediate complications.  ENDOSCOPIC IMPRESSION: Abnormal esophagus/short distal peptic stricture  - status post dilation with scope passage - status post biopsy. No varices. Hiatal hernia. Abnormal stomach - status post biopsy.     Duodenal mass of uncertain significance.  RECOMMENDATIONS: Follow-up on pathology. Increase Protonix to 40 mg twice daily. Repeat screening EGD in 2 years. Office visit in 12 weeks.  REPEAT EXAM:  eSigned:  R. Roetta SessionsMichael Jentry Warnell, MD Jerrel IvoryFACP [facility] Springs NorthFACG 07/26/2015 1:22 PM    CC:  CPT CODES: ICD CODES:  The ICD and CPT codes recommended by this software are interpretations from the data that the clinical staff has captured with the software.  The verification of the translation of this report to the ICD and CPT codes and modifiers is the sole responsibility of the health care institution and practicing physician where this report was generated.  [employer] will not be held responsible for the validity of the ICD and CPT codes included on this report.  AMA assumes no liability for data contained or not contained herein. CPT is a Publishing rights managerregistered trademark of the [employer].  PATIENT NAME:  [name], [name] MR#: [id number]

## 2015-07-29 ENCOUNTER — Telehealth: Payer: Self-pay | Admitting: Gastroenterology

## 2015-07-29 ENCOUNTER — Encounter: Payer: Self-pay | Admitting: Internal Medicine

## 2015-07-29 ENCOUNTER — Telehealth: Payer: Self-pay

## 2015-07-29 DIAGNOSIS — R7989 Other specified abnormal findings of blood chemistry: Secondary | ICD-10-CM

## 2015-07-29 DIAGNOSIS — R945 Abnormal results of liver function studies: Secondary | ICD-10-CM

## 2015-07-29 NOTE — Telephone Encounter (Signed)
Please let patient know she needs MRI Abd to follow up on left renal lesions.  Also needs further labs to look at liver in more detail.   Labs: AMA, ANA, antismooth muscle ab, ferritin,Immunoglobulins

## 2015-07-29 NOTE — Telephone Encounter (Signed)
PT HAS FU WITH [facility] 09/08/15  IS THIS DATE OK?

## 2015-07-29 NOTE — Telephone Encounter (Signed)
Per RMR- Send letter to patient.  Send copy of letter with path to referring provider and PCP. Candida esophagitis. Patient needs Diflucan 200 mg orally load followed by on her milligrams daily 21 days. No refills.   Office visit in 12 weeks.

## 2015-07-30 NOTE — Telephone Encounter (Signed)
Tried to call pt- NA- LM with daughter for pt to call back.

## 2015-07-30 NOTE — Telephone Encounter (Signed)
Tried to call pt- NA- LM with daughter for pt to call back. 

## 2015-08-03 NOTE — Telephone Encounter (Signed)
Tried to call- NA 

## 2015-08-09 NOTE — Telephone Encounter (Signed)
Tried to call pt- LM with daughter to return call. 

## 2015-08-09 NOTE — Telephone Encounter (Signed)
Tried to call pt- LM with daughter to return call.

## 2015-08-12 MED ORDER — FLUCONAZOLE 100 MG PO TABS: ORAL_TABLET | ORAL | Status: DC

## 2015-08-12 NOTE — Telephone Encounter (Signed)
Unable to reach pt by phone. rx has been sent to the pharmacy. Another letter has been mailed to the pt.

## 2015-08-12 NOTE — Telephone Encounter (Signed)
Unable to reach pt by phone. Letter and lab orders mailed to the pt.

## 2015-08-23 ENCOUNTER — Encounter: Payer: Self-pay | Admitting: Internal Medicine

## 2015-08-23 NOTE — Progress Notes (Signed)
Patient ID: [name], female   DOB: 1956/04/02, 59 y.o.   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 EUS recommended previously but unknown if done. This is likely a benign lesion.

## 2015-08-23 NOTE — Progress Notes (Signed)
Patient ID: [name], female   DOB: [birth year], 59 y.o.   MRN: [id number] Patient to be seen back in our office on December 28. Treated for candidal esophagitis. A remaining loose end is a pedunculated, submucosal appearing lesion in the proximal duodenum. Question lipoma versus other process. May need further evaluation.  I will ask Dr. Christella HartiganJacobs to take a look at the Endo photographs and see what he says

## 2015-08-24 NOTE — Progress Notes (Signed)
Yes, that's what I was thinking. I appreciate your taking the time to look at images.

## 2015-08-24 NOTE — Progress Notes (Signed)
Debbie NovemberMike, i reviewed your EGD images, see that it dates back several years. With her other health concerns, cirrhosis I think observing this clinically is reasonable. Agree very unlikely neoplastic since it's been there for 8 years and doesn't seem to have signficantly changed.

## 2015-09-08 ENCOUNTER — Encounter: Payer: Self-pay | Admitting: Gastroenterology

## 2015-09-08 ENCOUNTER — Ambulatory Visit: Payer: Self-pay | Admitting: Gastroenterology

## 2015-09-08 ENCOUNTER — Telehealth: Payer: Self-pay | Admitting: Gastroenterology

## 2015-09-08 NOTE — Telephone Encounter (Signed)
PATIENT WAS A NO SHOW AND LETTER SENT  °

## 2015-10-17 ENCOUNTER — Encounter (HOSPITAL_COMMUNITY): Payer: Self-pay | Admitting: Emergency Medicine

## 2015-10-17 ENCOUNTER — Emergency Department (HOSPITAL_COMMUNITY)
Admission: EM | Admit: 2015-10-17 | Discharge: 2015-10-17 | Disposition: A | Discharge status: Home / Self Care | Payer: Medicaid Other | Attending: Emergency Medicine | Admitting: Emergency Medicine

## 2015-10-17 ENCOUNTER — Emergency Department (HOSPITAL_COMMUNITY): Payer: Medicaid Other

## 2015-10-17 DIAGNOSIS — I10 Essential (primary) hypertension: Secondary | ICD-10-CM | POA: Insufficient documentation

## 2015-10-17 DIAGNOSIS — Z8781 Personal history of (healed) traumatic fracture: Secondary | ICD-10-CM | POA: Diagnosis not present

## 2015-10-17 DIAGNOSIS — Z862 Personal history of diseases of the blood and blood-forming organs and certain disorders involving the immune mechanism: Secondary | ICD-10-CM | POA: Insufficient documentation

## 2015-10-17 DIAGNOSIS — S8992XA Unspecified injury of left lower leg, initial encounter: Secondary | ICD-10-CM | POA: Diagnosis present

## 2015-10-17 DIAGNOSIS — W2209XA Striking against other stationary object, initial encounter: Secondary | ICD-10-CM | POA: Insufficient documentation

## 2015-10-17 DIAGNOSIS — Z8719 Personal history of other diseases of the digestive system: Secondary | ICD-10-CM | POA: Diagnosis not present

## 2015-10-17 DIAGNOSIS — Y9389 Activity, other specified: Secondary | ICD-10-CM | POA: Diagnosis not present

## 2015-10-17 DIAGNOSIS — Z79899 Other long term (current) drug therapy: Secondary | ICD-10-CM | POA: Insufficient documentation

## 2015-10-17 DIAGNOSIS — Y998 Other external cause status: Secondary | ICD-10-CM | POA: Diagnosis not present

## 2015-10-17 DIAGNOSIS — Z8673 Personal history of transient ischemic attack (TIA), and cerebral infarction without residual deficits: Secondary | ICD-10-CM | POA: Diagnosis not present

## 2015-10-17 DIAGNOSIS — Y9289 Other specified places as the place of occurrence of the external cause: Secondary | ICD-10-CM | POA: Diagnosis not present

## 2015-10-17 DIAGNOSIS — F17210 Nicotine dependence, cigarettes, uncomplicated: Secondary | ICD-10-CM | POA: Diagnosis not present

## 2015-10-17 DIAGNOSIS — M25562 Pain in left knee: Secondary | ICD-10-CM

## 2015-10-17 MED ORDER — HYDROCODONE-ACETAMINOPHEN 5-325 MG PO TABS
2.0000 | ORAL_TABLET | Freq: Once | ORAL | Status: AC
  Administered 2015-10-17: 2 via ORAL
  Filled 2015-10-17: qty 2

## 2015-10-17 MED ORDER — HYDROCODONE-ACETAMINOPHEN 5-325 MG PO TABS: 1.0000 | ORAL_TABLET | Freq: Four times a day (QID) | ORAL | Status: DC | PRN

## 2015-10-17 NOTE — ED Provider Notes (Signed)
CSN: [id number]     Arrival date & time 10/17/15  1608 History   First MD Initiated Contact with Patient 10/17/15 1642     Chief Complaint  Patient presents with  . Knee Pain     (Consider location/radiation/quality/duration/timing/severity/associated sxs/prior Treatment) HPI Patient presents by EMS for left knee pain. She states that at some point in the night she get up to go the bathroom and then struck her left knee on a wooden bedpost. Anything gave way and she felt ground. She denies any other injury specifically to the head or neck. She's had persistent left knee pain since this incident. She states she has been unable to walk, completely straighten or bend the knee. No numbness or focal weakness. No previous history of knee surgery or pain. Past Medical History  Diagnosis Date  . Hypertension   . GERD (gastroesophageal reflux disease)   . Stroke [facility]) 2008    left sided weakness  . Alcohol abuse     quit 05/23/15  . Normal cardiac stress test 2008    myoview  . Hepatic steatosis   . Cholelithiasis   . Compression fracture     L1, fall in 05/2015  . Alcoholic dementia (HCC)     05/2015,??? patient reports mental status clear, possible had change related to DTs  . Alcoholic cirrhosis (HCC)     05/2015  . Anemia of chronic disease   . Chronic GI bleeding   . Compression fracture of lumbar vertebra (HCC)   . Thrombocytopenia (HCC)   . Coagulopathy [facility])    Past Surgical History  Procedure Laterality Date  . Esophagogastroduodenoscopy  04/2007    RMR: tight peptic stricture status post dilation, antral erosions, superficial ulcerations, submucosal duodenal mass, gastric biopsy positive for H. pylori, duodenal biopsy benign, completed H. pylori treatment  . Appendectomy    . Colonoscopy      ???  . Esophagogastroduodenoscopy  07/2007    RMR: peptic stricture status post dilation, gastric erosions, 2 cm submucosal duodenal mass. Biopsies negative.referred for endoscopic  ultrasound of submucosal duodenal mass but unclear whether this was completed.  . Esophagogastroduodenoscopy (egd) with propofol N/A 07/26/2015    RMR: Abnoramal esophagus/short distal peptic stricture-status post dilation with scope passage- status post biopsy. No varices. Hiatal hernia. Abnormal stomach-status post biopsy. duodenal mass of uncertain significance.   . Biopsy N/A 07/26/2015    Procedure: BIOPSY;  Surgeon: Corbin Ade, MD;  Location: [facility] ORS;  Service: Endoscopy;  Laterality: N/A;  Gastric, Esophageal   Family History  Problem Relation Age of Onset  . Leukemia Mother   . Cirrhosis Paternal Uncle     etoh  . Colon cancer Neg Hx    Social History  Substance Use Topics  . Smoking status: Current Every Day Smoker -- 1.50 packs/day for 40 years    Types: Cigarettes  . Smokeless tobacco: Never Used  . Alcohol Use: 12.0 oz/week    20 Shots of liquor per week     Comment: quit etoh 05/23/15. used to drink 1/2 gallon vodka in two days    OB History    No data available     Review of Systems  Constitutional: Negative for fever and chills.  Respiratory: Negative for shortness of breath.   Cardiovascular: Negative for leg swelling.  Gastrointestinal: Negative for nausea and vomiting.  Musculoskeletal: Positive for arthralgias. Negative for back pain and neck pain.  Skin: Negative for rash and wound.  Neurological: Negative for dizziness, syncope, weakness,  light-headedness, numbness and headaches.  All other systems reviewed and are negative.     Allergies  Loratadine  Home Medications   Prior to Admission medications   Medication Sig Start Date End Date Taking? Authorizing Provider  albuterol (PROVENTIL HFA;VENTOLIN HFA) 108 (90 BASE) MCG/ACT inhaler Inhale 1-2 puffs into the lungs every 6 (six) hours as needed for wheezing. 06/02/15 01/31/19 Yes Lesle Chris Black, NP  feeding supplement, ENSURE ENLIVE, (ENSURE ENLIVE) LIQD Take 237 mLs by mouth 2 (two) times daily  between meals. 06/02/15  Yes Lesle Chris Black, NP  cefUROXime (CEFTIN) 500 MG tablet Take 1 tablet (500 mg total) by mouth 2 (two) times daily with a meal. Patient not taking: Reported on 07/19/2015 06/27/15   Standley Brooking, MD  fluconazole (DIFLUCAN) 100 MG tablet 200 mg on day one, followed by 100 mg daily for 21 days Patient not taking: Reported on 10/17/2015 08/12/15   Corbin Ade, MD  folic acid (FOLVITE) 1 MG tablet Take 1 tablet (1 mg total) by mouth daily. Patient not taking: Reported on 10/17/2015 06/02/15   Gwenyth Bender, NP  HYDROcodone-acetaminophen (NORCO) 5-325 MG tablet Take 1 tablet by mouth every 6 (six) hours as needed for severe pain. 10/17/15   Loren Racer, MD  metoprolol succinate (TOPROL-XL) 25 MG 24 hr tablet Take 1 tablet (25 mg total) by mouth daily. Patient not taking: Reported on 10/17/2015 06/02/15   Gwenyth Bender, NP  Multiple Vitamins-Minerals (MULTIVITAMIN) tablet Take 1 tablet by mouth daily. Patient not taking: Reported on 10/17/2015 06/27/15   Standley Brooking, MD  pantoprazole (PROTONIX) 40 MG tablet Take 1 tablet (40 mg total) by mouth daily with supper. Patient not taking: Reported on 10/17/2015 06/02/15   Gwenyth Bender, NP  saccharomyces boulardii (FLORASTOR) 250 MG capsule Take 1 capsule (250 mg total) by mouth 2 (two) times daily. Patient not taking: Reported on 10/17/2015 06/02/15   Lesle Chris Black, NP   BP 145/83 mmHg  Pulse 113  Temp(Src) 98.1 F (36.7 C) (Oral)  Resp 18  Ht 5' (1.524 m)  Wt 127 lb (57.607 kg)  BMI 24.80 kg/m2  SpO2 97% Physical Exam  Constitutional: She is oriented to person, place, and time. She appears well-developed and well-nourished. She appears distressed.  HENT:  Head: Normocephalic and atraumatic.  Mouth/Throat: Oropharynx is clear and moist.  Eyes: EOM are normal. Pupils are equal, round, and reactive to light.  Neck: Normal range of motion. Neck supple.  Cardiovascular: Normal rate and regular rhythm.   Pulmonary/Chest: Effort  normal and breath sounds normal.  Abdominal: Soft. Bowel sounds are normal.  Musculoskeletal: She exhibits tenderness. She exhibits no edema.  Patient with tenderness to palpation over the lateral surface of the left knee. No pulsatile fossa swelling. There is no obvious deformity. No warmth or erythema. No evidence of contusion. Patient with inability to fully extend the left knee. Unable to assess for ligamentous instability due to pain and lack of cooperation with exam.  Neurological: She is alert and oriented to person, place, and time.  Sensation to the left foot and lower leg is intact. Moving toes freely.  Skin: Skin is warm and dry. No rash noted. No erythema.  Psychiatric: She has a normal mood and affect. Her behavior is normal.  Nursing note and vitals reviewed.   ED Course  Procedures (including critical care time) Labs Review Labs Reviewed - No data to display  Imaging Review Dg Knee Complete 4 Views Left  10/17/2015  CLINICAL DATA:  Patient with pain to the posterior aspect of the leg. Status post fall. Initial encounter. EXAM: LEFT KNEE - COMPLETE 4+ VIEW COMPARISON:  None. FINDINGS: Normal anatomic alignment. No evidence for acute fracture or dislocation. Either benign fibro-osseous lesion or chronic bone infarct within the proximal tibia. Regional soft tissues are unremarkable. No joint effusion. IMPRESSION: No acute osseous abnormality. Electronically Signed   By: Annia Belt M.D.   On: 10/17/2015 18:01   I have personally reviewed and evaluated these images and lab results as part of my medical decision-making.   EKG Interpretation None      MDM   Final diagnoses:  Left knee pain   Evidence of fracture on x-ray. Informed of results and need to follow-up with orthopedist. Placed in knee immobilizer and given crutches. Return precautions given.     Loren Racer, MD 10/17/15 [id number]

## 2015-10-17 NOTE — ED Notes (Signed)
Patient brought in via EMS from home. Alert and oriented. Airway patent. Patient c/o sudden pain to posterior aspect of leg causing her to fall and hit knee and foot on wooden bed frame, Denies hitting head or LOC. Per patient painful to touch.

## 2015-10-17 NOTE — Discharge Instructions (Signed)

## 2016-04-25 ENCOUNTER — Encounter (HOSPITAL_COMMUNITY): Payer: Self-pay | Admitting: *Deleted

## 2016-04-25 ENCOUNTER — Emergency Department (HOSPITAL_COMMUNITY)
Admission: EM | Admit: 2016-04-25 | Discharge: 2016-04-25 | Disposition: A | Discharge status: Home / Self Care | Payer: Medicaid Other | Attending: Dermatology | Admitting: Dermatology

## 2016-04-25 DIAGNOSIS — F17210 Nicotine dependence, cigarettes, uncomplicated: Secondary | ICD-10-CM | POA: Insufficient documentation

## 2016-04-25 DIAGNOSIS — Z79899 Other long term (current) drug therapy: Secondary | ICD-10-CM | POA: Diagnosis not present

## 2016-04-25 DIAGNOSIS — Z5321 Procedure and treatment not carried out due to patient leaving prior to being seen by health care provider: Secondary | ICD-10-CM | POA: Diagnosis not present

## 2016-04-25 DIAGNOSIS — Z8673 Personal history of transient ischemic attack (TIA), and cerebral infarction without residual deficits: Secondary | ICD-10-CM | POA: Insufficient documentation

## 2016-04-25 DIAGNOSIS — R102 Pelvic and perineal pain: Secondary | ICD-10-CM | POA: Insufficient documentation

## 2016-04-25 DIAGNOSIS — I10 Essential (primary) hypertension: Secondary | ICD-10-CM | POA: Insufficient documentation

## 2016-04-25 NOTE — ED Triage Notes (Signed)
Pt states she began having increased pelvic pain and pressure starting 2 weeks ago. Pt denies any discharge or itching. Pt states she feels like "something is going to fall out."

## 2016-04-27 ENCOUNTER — Encounter (HOSPITAL_COMMUNITY): Payer: Self-pay

## 2016-04-27 ENCOUNTER — Inpatient Hospital Stay (HOSPITAL_COMMUNITY)
Admission: EM | Admit: 2016-04-27 | Discharge: 2016-05-01 | DRG: 872 | Disposition: A | Discharge status: Home / Self Care | Payer: Medicaid Other | Attending: Internal Medicine | Admitting: Internal Medicine

## 2016-04-27 ENCOUNTER — Emergency Department (HOSPITAL_COMMUNITY): Payer: Medicaid Other

## 2016-04-27 DIAGNOSIS — F1010 Alcohol abuse, uncomplicated: Secondary | ICD-10-CM | POA: Diagnosis present

## 2016-04-27 DIAGNOSIS — D638 Anemia in other chronic diseases classified elsewhere: Secondary | ICD-10-CM | POA: Diagnosis present

## 2016-04-27 DIAGNOSIS — K760 Fatty (change of) liver, not elsewhere classified: Secondary | ICD-10-CM | POA: Diagnosis present

## 2016-04-27 DIAGNOSIS — D539 Nutritional anemia, unspecified: Secondary | ICD-10-CM | POA: Diagnosis not present

## 2016-04-27 DIAGNOSIS — A419 Sepsis, unspecified organism: Principal | ICD-10-CM | POA: Diagnosis present

## 2016-04-27 DIAGNOSIS — Z720 Tobacco use: Secondary | ICD-10-CM

## 2016-04-27 DIAGNOSIS — I69354 Hemiplegia and hemiparesis following cerebral infarction affecting left non-dominant side: Secondary | ICD-10-CM

## 2016-04-27 DIAGNOSIS — K219 Gastro-esophageal reflux disease without esophagitis: Secondary | ICD-10-CM | POA: Diagnosis present

## 2016-04-27 DIAGNOSIS — F17210 Nicotine dependence, cigarettes, uncomplicated: Secondary | ICD-10-CM | POA: Diagnosis present

## 2016-04-27 DIAGNOSIS — Z806 Family history of leukemia: Secondary | ICD-10-CM | POA: Diagnosis not present

## 2016-04-27 DIAGNOSIS — D6959 Other secondary thrombocytopenia: Secondary | ICD-10-CM | POA: Diagnosis present

## 2016-04-27 DIAGNOSIS — R300 Dysuria: Secondary | ICD-10-CM

## 2016-04-27 DIAGNOSIS — N390 Urinary tract infection, site not specified: Secondary | ICD-10-CM | POA: Diagnosis present

## 2016-04-27 DIAGNOSIS — I10 Essential (primary) hypertension: Secondary | ICD-10-CM | POA: Diagnosis present

## 2016-04-27 DIAGNOSIS — K7030 Alcoholic cirrhosis of liver without ascites: Secondary | ICD-10-CM | POA: Diagnosis present

## 2016-04-27 DIAGNOSIS — N368 Other specified disorders of urethra: Secondary | ICD-10-CM | POA: Diagnosis present

## 2016-04-27 DIAGNOSIS — B961 Klebsiella pneumoniae [K. pneumoniae] as the cause of diseases classified elsewhere: Secondary | ICD-10-CM | POA: Diagnosis present

## 2016-04-27 DIAGNOSIS — G8929 Other chronic pain: Secondary | ICD-10-CM | POA: Diagnosis present

## 2016-04-27 DIAGNOSIS — B9620 Unspecified Escherichia coli [E. coli] as the cause of diseases classified elsewhere: Secondary | ICD-10-CM | POA: Diagnosis present

## 2016-04-27 DIAGNOSIS — E871 Hypo-osmolality and hyponatremia: Secondary | ICD-10-CM | POA: Diagnosis present

## 2016-04-27 LAB — HEPATIC FUNCTION PANEL
ALK PHOS: 120 U/L (ref 38–126)
ALT: 21 U/L (ref 14–54)
AST: 117 U/L — ABNORMAL HIGH (ref 15–41)
Albumin: 2.4 g/dL — ABNORMAL LOW (ref 3.5–5.0)
BILIRUBIN DIRECT: 1.2 mg/dL — AB (ref 0.1–0.5)
BILIRUBIN INDIRECT: 1.1 mg/dL — AB (ref 0.3–0.9)
BILIRUBIN TOTAL: 2.3 mg/dL — AB (ref 0.3–1.2)
TOTAL PROTEIN: 7.6 g/dL (ref 6.5–8.1)

## 2016-04-27 LAB — URINALYSIS, ROUTINE W REFLEX MICROSCOPIC
Bilirubin Urine: NEGATIVE
GLUCOSE, UA: NEGATIVE mg/dL
KETONES UR: NEGATIVE mg/dL
NITRITE: NEGATIVE
PROTEIN: 30 mg/dL — AB
Specific Gravity, Urine: 1.01 (ref 1.005–1.030)
pH: 5.5 (ref 5.0–8.0)

## 2016-04-27 LAB — MAGNESIUM: MAGNESIUM: 1.4 mg/dL — AB (ref 1.7–2.4)

## 2016-04-27 LAB — URINE MICROSCOPIC-ADD ON: SQUAMOUS EPITHELIAL / LPF: NONE SEEN

## 2016-04-27 LAB — CBC WITH DIFFERENTIAL/PLATELET
BASOS ABS: 0 10*3/uL (ref 0.0–0.1)
BASOS PCT: 1 %
EOS PCT: 1 %
Eosinophils Absolute: 0.1 10*3/uL (ref 0.0–0.7)
HEMATOCRIT: 31.9 % — AB (ref 36.0–46.0)
Hemoglobin: 10.7 g/dL — ABNORMAL LOW (ref 12.0–15.0)
Lymphocytes Relative: 51 %
Lymphs Abs: 2.3 10*3/uL (ref 0.7–4.0)
MCH: 41.8 pg — ABNORMAL HIGH (ref 26.0–34.0)
MCHC: 33.5 g/dL (ref 30.0–36.0)
MCV: 124.6 fL — ABNORMAL HIGH (ref 78.0–100.0)
MONO ABS: 0.3 10*3/uL (ref 0.1–1.0)
MONOS PCT: 6 %
NEUTROS ABS: 1.9 10*3/uL (ref 1.7–7.7)
Neutrophils Relative %: 41 %
PLATELETS: 34 10*3/uL — AB (ref 150–400)
RBC: 2.56 MIL/uL — ABNORMAL LOW (ref 3.87–5.11)
RDW: 20.7 % — AB (ref 11.5–15.5)
WBC: 4.6 10*3/uL (ref 4.0–10.5)

## 2016-04-27 LAB — BASIC METABOLIC PANEL
ANION GAP: 10 (ref 5–15)
BUN: 10 mg/dL (ref 6–20)
CALCIUM: 8 mg/dL — AB (ref 8.9–10.3)
CO2: 19 mmol/L — AB (ref 22–32)
Chloride: 101 mmol/L (ref 101–111)
Creatinine, Ser: 0.85 mg/dL (ref 0.44–1.00)
GFR calc Af Amer: >60 mL/min (ref >60)
GFR calc non Af Amer: >60 mL/min (ref >60)
GLUCOSE: 83 mg/dL (ref 65–99)
Potassium: 3.4 mmol/L — ABNORMAL LOW (ref 3.5–5.1)
Sodium: 130 mmol/L — ABNORMAL LOW (ref 135–145)

## 2016-04-27 LAB — WET PREP, GENITAL
CLUE CELLS WET PREP: NONE SEEN
Sperm: NONE SEEN
TRICH WET PREP: NONE SEEN
WBC, Wet Prep HPF POC: NONE SEEN
YEAST WET PREP: NONE SEEN

## 2016-04-27 LAB — PROTIME-INR
INR: 1.2
PROTHROMBIN TIME: 15.3 s — AB (ref 11.4–15.2)

## 2016-04-27 LAB — LACTIC ACID, PLASMA
Lactic Acid, Venous: 3.6 mmol/L (ref 0.5–1.9)
Lactic Acid, Venous: 3 mmol/L (ref 0.5–1.9)
Lactic Acid, Venous: 2.3 mmol/L (ref 0.5–1.9)

## 2016-04-27 LAB — TROPONIN I

## 2016-04-27 LAB — PROCALCITONIN: Procalcitonin: 5.68 ng/mL

## 2016-04-27 LAB — ETHANOL: ALCOHOL ETHYL (B): 61 mg/dL — AB (ref <5)

## 2016-04-27 LAB — APTT: aPTT: 48 seconds — ABNORMAL HIGH (ref 24–36)

## 2016-04-27 LAB — PHOSPHORUS: Phosphorus: 2.6 mg/dL (ref 2.5–4.6)

## 2016-04-27 MED ORDER — SODIUM CHLORIDE 0.9 % IV BOLUS (SEPSIS)
1000.0000 mL | Freq: Once | INTRAVENOUS | Status: AC
  Administered 2016-04-27: 1000 mL via INTRAVENOUS

## 2016-04-27 MED ORDER — ONDANSETRON HCL 4 MG/2ML IJ SOLN: 4.0000 mg | Freq: Three times a day (TID) | INTRAMUSCULAR | Status: DC | PRN

## 2016-04-27 MED ORDER — DEXTROSE 50 % IV SOLN
1.0000 | Freq: Once | INTRAVENOUS | Status: AC
  Administered 2016-04-27: 50 mL via INTRAVENOUS

## 2016-04-27 MED ORDER — NICOTINE 21 MG/24HR TD PT24
21.0000 mg | MEDICATED_PATCH | Freq: Every day | TRANSDERMAL | Status: DC
  Administered 2016-04-27 – 2016-05-01 (×5): 21 mg via TRANSDERMAL
  Filled 2016-04-27 (×5): qty 1

## 2016-04-27 MED ORDER — PROMETHAZINE HCL 25 MG/ML IJ SOLN
25.0000 mg | Freq: Three times a day (TID) | INTRAMUSCULAR | Status: DC | PRN
  Administered 2016-04-27: 25 mg via INTRAVENOUS
  Filled 2016-04-27: qty 1

## 2016-04-27 MED ORDER — DEXTROSE 50 % IV SOLN
INTRAVENOUS | Status: AC
  Administered 2016-04-27: 22:00:00
  Filled 2016-04-27: qty 50

## 2016-04-27 MED ORDER — ACETAMINOPHEN-CODEINE #3 300-30 MG PO TABS
1.0000 | ORAL_TABLET | Freq: Four times a day (QID) | ORAL | Status: DC | PRN
  Administered 2016-04-27 – 2016-04-29 (×2): 1 via ORAL
  Filled 2016-04-27 (×2): qty 1

## 2016-04-27 MED ORDER — SODIUM CHLORIDE 0.9% FLUSH
3.0000 mL | Freq: Two times a day (BID) | INTRAVENOUS | Status: DC
  Administered 2016-04-27 – 2016-04-30 (×6): 3 mL via INTRAVENOUS

## 2016-04-27 MED ORDER — LIDOCAINE HCL 2 % EX GEL
1.0000 "application " | Freq: Once | CUTANEOUS | Status: AC
  Administered 2016-04-27: 1 via TOPICAL
  Filled 2016-04-27: qty 10

## 2016-04-27 MED ORDER — MORPHINE SULFATE (PF) 4 MG/ML IV SOLN
4.0000 mg | Freq: Once | INTRAVENOUS | Status: AC
  Administered 2016-04-27: 4 mg via INTRAVENOUS
  Filled 2016-04-27: qty 1

## 2016-04-27 MED ORDER — ONDANSETRON HCL 4 MG/2ML IJ SOLN
4.0000 mg | Freq: Four times a day (QID) | INTRAMUSCULAR | Status: DC | PRN
  Administered 2016-04-27 – 2016-04-30 (×2): 4 mg via INTRAVENOUS
  Filled 2016-04-27 (×2): qty 2

## 2016-04-27 MED ORDER — PROMETHAZINE HCL 12.5 MG PO TABS: 25.0000 mg | ORAL_TABLET | Freq: Three times a day (TID) | ORAL | Status: DC | PRN

## 2016-04-27 MED ORDER — MORPHINE SULFATE (PF) 4 MG/ML IV SOLN
4.0000 mg | INTRAVENOUS | Status: DC | PRN
  Administered 2016-04-27 – 2016-04-30 (×13): 4 mg via INTRAVENOUS
  Filled 2016-04-27 (×13): qty 1

## 2016-04-27 MED ORDER — LORAZEPAM 2 MG/ML IJ SOLN: 1.0000 mg | Freq: Four times a day (QID) | INTRAMUSCULAR | Status: AC | PRN

## 2016-04-27 MED ORDER — DEXTROSE 5 % IV SOLN
1.0000 g | INTRAVENOUS | Status: DC
  Administered 2016-04-28 – 2016-04-30 (×3): 1 g via INTRAVENOUS
  Filled 2016-04-27 (×6): qty 10

## 2016-04-27 MED ORDER — ADULT MULTIVITAMIN W/MINERALS CH
1.0000 | ORAL_TABLET | Freq: Every day | ORAL | Status: DC
  Administered 2016-04-27 – 2016-05-01 (×5): 1 via ORAL
  Filled 2016-04-27 (×5): qty 1

## 2016-04-27 MED ORDER — ALBUTEROL SULFATE (2.5 MG/3ML) 0.083% IN NEBU: 3.0000 mL | INHALATION_SOLUTION | Freq: Four times a day (QID) | RESPIRATORY_TRACT | Status: DC | PRN

## 2016-04-27 MED ORDER — ENSURE ENLIVE PO LIQD
237.0000 mL | Freq: Two times a day (BID) | ORAL | Status: DC
  Administered 2016-04-28 – 2016-05-01 (×7): 237 mL via ORAL

## 2016-04-27 MED ORDER — THIAMINE HCL 100 MG/ML IJ SOLN: 100.0000 mg | Freq: Every day | INTRAMUSCULAR | Status: DC

## 2016-04-27 MED ORDER — SODIUM CHLORIDE 0.9 % IV SOLN
8.0000 mg | Freq: Four times a day (QID) | INTRAVENOUS | Status: DC | PRN
  Filled 2016-04-27: qty 4

## 2016-04-27 MED ORDER — PHENAZOPYRIDINE HCL 100 MG PO TABS: 200.0000 mg | ORAL_TABLET | Freq: Three times a day (TID) | ORAL | Status: DC | PRN

## 2016-04-27 MED ORDER — ONDANSETRON HCL 4 MG/2ML IJ SOLN
4.0000 mg | Freq: Once | INTRAMUSCULAR | Status: AC
  Administered 2016-04-27: 4 mg via INTRAVENOUS
  Filled 2016-04-27: qty 2

## 2016-04-27 MED ORDER — PROMETHAZINE HCL 25 MG RE SUPP: 25.0000 mg | Freq: Three times a day (TID) | RECTAL | Status: DC | PRN

## 2016-04-27 MED ORDER — IBUPROFEN 400 MG PO TABS
600.0000 mg | ORAL_TABLET | Freq: Four times a day (QID) | ORAL | Status: DC | PRN
Start: 2016-04-27 — End: 2016-05-01
  Administered 2016-05-01: 600 mg via ORAL
  Filled 2016-04-27: qty 2

## 2016-04-27 MED ORDER — LORAZEPAM 1 MG PO TABS: 1.0000 mg | ORAL_TABLET | Freq: Four times a day (QID) | ORAL | Status: AC | PRN

## 2016-04-27 MED ORDER — SODIUM CHLORIDE 0.9 % IV SOLN
INTRAVENOUS | Status: DC
  Administered 2016-04-27 – 2016-04-30 (×6): via INTRAVENOUS

## 2016-04-27 MED ORDER — ONDANSETRON HCL 4 MG/2ML IJ SOLN: 4.0000 mg | Freq: Four times a day (QID) | INTRAMUSCULAR | Status: DC | PRN

## 2016-04-27 MED ORDER — DEXTROSE 5 % IV SOLN
1.0000 g | Freq: Once | INTRAVENOUS | Status: AC
  Administered 2016-04-27: 1 g via INTRAVENOUS
  Filled 2016-04-27: qty 10

## 2016-04-27 MED ORDER — VITAMIN B-1 100 MG PO TABS
100.0000 mg | ORAL_TABLET | Freq: Every day | ORAL | Status: DC
  Administered 2016-04-27 – 2016-05-01 (×5): 100 mg via ORAL
  Filled 2016-04-27 (×5): qty 1

## 2016-04-27 MED ORDER — FOLIC ACID 1 MG PO TABS
1.0000 mg | ORAL_TABLET | Freq: Every day | ORAL | Status: DC
  Administered 2016-04-27 – 2016-05-01 (×5): 1 mg via ORAL
  Filled 2016-04-27 (×5): qty 1

## 2016-04-27 NOTE — ED Provider Notes (Signed)
[facility]-EMER[facility] DEPT Provider Note   CSN: [id number] Arrival date & time: 04/27/16  1148     History   Chief Complaint Chief Complaint  Patient presents with  . Urinary Frequency  . Back Pain    HPI [name] is a 60 y.o. female with a history of alcohol abuse with subsequent cirrhosis, history of anemia and thrombocytopenia and CVA with resultant left weakness presenting with  bilateral lower back and vaginal pain which as been getting progressively worse over the past 2 weeks.  She reports dysuria including increased frequency along with episodic hematuria and urgency.  In addition she describes severe burning vaginal discomfort worse with urination but also present persistently since the dysuria began.  She states "it feels like something is going to fall out" as she described severe pressure in her bladder and pelvic region.  She has had no fevers or chills, denies nausea, emesis.  She does have a history of chronic low back pain secondary to a lumbar compression fracture with resultant radicular pain which is similar today.  She denies vaginal discharge and is not sexually active.  She uses an OTC yeast cream yesterday which made her pain worse.  The history is provided by the patient and a relative.    Past Medical History:  Diagnosis Date  . Alcohol abuse    quit 05/23/15  . Alcoholic cirrhosis (HCC)    05/2015  . Alcoholic dementia (HCC)    05/2015,??? patient reports mental status clear, possible had change related to DTs  . Anemia of chronic disease   . Cholelithiasis   . Chronic GI bleeding   . Coagulopathy (HCC)   . Compression fracture    L1, fall in 05/2015  . Compression fracture of lumbar vertebra (HCC)   . GERD (gastroesophageal reflux disease)   . Hepatic steatosis   . Hypertension   . Normal cardiac stress test 2008   myoview  . Stroke [facility](HCC) 2008   left sided weakness  . Thrombocytopenia [facility](HCC)     Patient Active Problem List   Diagnosis Date Noted    . Mucosal abnormality of stomach   . Mucosal abnormality of esophagus   . Melena 07/06/2015  . Renal lesion 07/06/2015  . Sepsis (HCC) 06/26/2015  . Anemia of chronic disease 06/26/2015  . UTI (lower urinary tract infection) 06/25/2015  . Aspiration pneumonia (HCC) 05/29/2015  . E-coli UTI 05/28/2015  . Coagulopathy (HCC) 05/28/2015  . Sepsis due to Gram negative bacteria (HCC) 05/27/2015  . Compression fracture of L1 lumbar vertebra (HCC) 05/26/2015  . Macrocytic anemia 05/25/2015  . Alcoholic cirrhosis (HCC) 05/25/2015  . Acute encephalopathy 05/25/2015  . Thrombocytopenia (HCC) 05/25/2015  . Elevated troponin 05/25/2015  . ETOH abuse 05/25/2015  . Anemia 03/22/2015  . Abnormal liver function   . Shoulder impingement syndrome 07/21/2014  . Cholecystitis, acute 07/23/2013  . Trichomonas infection 07/23/2013  . Acute gastroenteritis 07/22/2013  . Alcohol abuse 07/22/2013  . Hyponatremia 07/22/2013  . Hypokalemia 07/22/2013  . Dehydration 07/22/2013  . Macrocytosis 07/22/2013  . Cholelithiasis 07/22/2013  . Renal cyst 07/22/2013    Past Surgical History:  Procedure Laterality Date  . APPENDECTOMY    . BIOPSY N/A 07/26/2015   Procedure: BIOPSY;  Surgeon: Corbin Adeobert M Rourk, MD;  Location: [facility] ORS;  Service: Endoscopy;  Laterality: N/A;  Gastric, Esophageal  . COLONOSCOPY     ???  . ESOPHAGOGASTRODUODENOSCOPY  04/2007   RMR: tight peptic stricture status post dilation, antral erosions, superficial ulcerations,  submucosal duodenal mass, gastric biopsy positive for H. pylori, duodenal biopsy benign, completed H. pylori treatment  . ESOPHAGOGASTRODUODENOSCOPY  07/2007   RMR: peptic stricture status post dilation, gastric erosions, 2 cm submucosal duodenal mass. Biopsies negative.referred for endoscopic ultrasound of submucosal duodenal mass but unclear whether this was completed.  . ESOPHAGOGASTRODUODENOSCOPY (EGD) WITH PROPOFOL N/A 07/26/2015   RMR: Abnoramal esophagus/short  distal peptic stricture-status post dilation with scope passage- status post biopsy. No varices. Hiatal hernia. Abnormal stomach-status post biopsy. duodenal mass of uncertain significance.     OB History    No data available       Home Medications    Prior to Admission medications   Medication Sig Start Date End Date Taking? Authorizing Provider  acetaminophen-codeine (TYLENOL #3) 300-30 MG tablet Take 1 tablet by mouth every 6 (six) hours as needed for moderate pain.   Yes Historical Provider, MD  albuterol (PROVENTIL HFA;VENTOLIN HFA) 108 (90 BASE) MCG/ACT inhaler Inhale 1-2 puffs into the lungs every 6 (six) hours as needed for wheezing. 06/02/15 01/31/19 Yes Lesle ChrisKaren M Black, NP  feeding supplement, ENSURE ENLIVE, (ENSURE ENLIVE) LIQD Take 237 mLs by mouth 2 (two) times daily between meals. 06/02/15  Yes Gwenyth BenderKaren M Black, NP    Family History Family History  Problem Relation Age of Onset  . Leukemia Mother   . Cirrhosis Paternal Uncle     etoh  . Colon cancer Neg Hx     Social History Social History  Substance Use Topics  . Smoking status: Current Every Day Smoker    Packs/day: 1.50    Years: 40.00    Types: Cigarettes  . Smokeless tobacco: Never Used  . Alcohol use 12.0 oz/week    20 Shots of liquor per week     Comment: quit etoh 05/23/15. used to drink 1/2 gallon vodka in two days      Allergies   Loratadine   Review of Systems Review of Systems  Constitutional: Negative for fever.  HENT: Negative for congestion and sore throat.   Eyes: Negative.   Respiratory: Negative for chest tightness and shortness of breath.   Cardiovascular: Negative for chest pain.  Gastrointestinal: Negative for abdominal pain and nausea.  Genitourinary: Positive for dysuria, frequency, hematuria, urgency and vaginal pain. Negative for vaginal discharge.  Musculoskeletal: Positive for back pain. Negative for arthralgias, joint swelling and neck pain.  Skin: Negative.  Negative for rash  and wound.  Neurological: Negative for dizziness, weakness, light-headedness, numbness and headaches.  Psychiatric/Behavioral: Negative.      Physical Exam Updated Vital Signs BP 132/74   Pulse 101   Temp 97.6 F (36.4 C)   Resp 19   Ht 5\' 6"  (1.676 m)   Wt 57.6 kg   SpO2 95%   BMI 20.50 kg/m   Physical Exam  Constitutional: She appears well-developed and well-nourished. She has a sickly appearance. She appears distressed.  Patient cachectic, looks pale.  She appears uncomfortable.  HENT:  Head: Normocephalic and atraumatic.  Eyes: Conjunctivae are normal.  Neck: Normal range of motion.  Cardiovascular: Normal rate, regular rhythm, normal heart sounds and intact distal pulses.   Pulmonary/Chest: Effort normal and breath sounds normal. She has no wheezes.  Abdominal: Soft. Bowel sounds are normal. She exhibits no mass. There is no guarding.  Genitourinary:  Genitourinary Comments: Attempted speculum exam which patient did not tolerate well.  I was able to collect a wet prep.  There were no lesions, vaginal erythema or significant discharge on exam.  No vesicles or other source of vaginal pain.  Musculoskeletal: Normal range of motion.  Neurological: She is alert.  Skin: Skin is warm and dry.  Psychiatric: She has a normal mood and affect.  Nursing note and vitals reviewed.    ED Treatments / Results  Labs (all labs ordered are listed, but only abnormal results are displayed) Labs Reviewed  URINALYSIS, ROUTINE W REFLEX MICROSCOPIC (NOT AT [facility]) - Abnormal; Notable for the following:       Result Value   APPearance CLOUDY (*)    Hgb urine dipstick MODERATE (*)    Protein, ur 30 (*)    Leukocytes, UA MODERATE (*)    All other components within normal limits  URINE MICROSCOPIC-ADD ON - Abnormal; Notable for the following:    Bacteria, UA MANY (*)    All other components within normal limits  BASIC METABOLIC PANEL - Abnormal; Notable for the following:    Sodium 130  (*)    Potassium 3.4 (*)    CO2 19 (*)    Calcium 8.0 (*)    All other components within normal limits  CBC WITH DIFFERENTIAL/PLATELET - Abnormal; Notable for the following:    RBC 2.56 (*)    Hemoglobin 10.7 (*)    HCT 31.9 (*)    MCV 124.6 (*)    MCH 41.8 (*)    RDW 20.7 (*)    Platelets 34 (*)    All other components within normal limits  HEPATIC FUNCTION PANEL - Abnormal; Notable for the following:    Albumin 2.4 (*)    AST 117 (*)    Total Bilirubin 2.3 (*)    Bilirubin, Direct 1.2 (*)    Indirect Bilirubin 1.1 (*)    All other components within normal limits  ETHANOL - Abnormal; Notable for the following:    Alcohol, Ethyl (B) 61 (*)    All other components within normal limits  LACTIC ACID, PLASMA - Abnormal; Notable for the following:    Lactic Acid, Venous 3.6 (*)    All other components within normal limits  WET PREP, GENITAL  URINE CULTURE  CULTURE, BLOOD (ROUTINE X 2)  CULTURE, BLOOD (ROUTINE X 2)    EKG  EKG Interpretation None       Radiology Ct Renal Stone Study  Result Date: 04/27/2016 CLINICAL DATA:  Dysuria, urethral pain, history kidney stones, hypertension, alcoholic cirrhosis, GERD, appendectomy, GI bleeding EXAM: CT ABDOMEN AND PELVIS WITHOUT CONTRAST TECHNIQUE: Multidetector CT imaging of the abdomen and pelvis was performed following the standard protocol without IV contrast. Sagittal and coronal MPR images reconstructed from axial data set. COMPARISON:  05/25/2015 FINDINGS: Lower chest:  Minimal bibasilar atelectasis Hepatobiliary: Cirrhotic liver with few tiny scattered low-attenuation foci, nonspecific, grossly unchanged. Numerous calculi within gallbladder. No discrete hepatic mass. Pancreas: Normal appearance Spleen: Normal appearance Adrenals/Urinary Tract: Slight thickening of LEFT adrenal gland without discrete adrenal mass. Cyst anterior LEFT kidney inferior pole 3.0 x 2.3 cm grossly unchanged. Kidneys otherwise normal appearance. Additional  mass, hydronephrosis, or urinary tract calcification. Mild scattered bladder wall thickening with mild infiltration of perivesicular fat question cystitis. Stomach/Bowel: Small fat attenuation focus within duodenum 7 x 6 x 14 mm question tiny duodenal lipoma. Sigmoid diverticulosis. Stomach and bowel loops otherwise normal appearance. Appendix surgically absent by history. Vascular/Lymphatic: Atherosclerotic calcifications aorta, iliac arteries, coronary arteries. Numerous pelvic phleboliths. No enlarged abdominal or pelvic nodes. Reproductive: Calcified leiomyoma within uterus. Uterus and adnexa otherwise unremarkable. Other: Mild hazy infiltrative changes identified adjacent  to small normal size lymph nodes at celiac axis question minimal adenitis or inflammatory process. No abscess collection, free air or free fluid. No hernia. Musculoskeletal: Marked compression deformity of L1 vertebral body with vertebra plana configuration focal kyphosis, progressive since 05/25/2015. Bones appear demineralized. IMPRESSION: Mild bladder wall thickening with infiltration of perivesicular fat raising question of cystitis. Sigmoid diverticulosis without evidence of diverticulitis. Marked compression fracture of L1 vertebral body with vertebra plana configuration associated with focal kyphosis, progressive since 2016. Minimal hazy infiltrative changes adjacent to small normal sized lymph nodes at the celiac access raising question of adenitis or nonspecific inflammatory process, of uncertain relationship to cystitis identified in pelvis. Electronically Signed   By: Ulyses Southward M.D.   On: 04/27/2016 15:22    Procedures Procedures (including critical care time)  Medications Ordered in ED Medications  sodium chloride 0.9 % bolus 1,000 mL (1,000 mLs Intravenous New Bag/Given 04/27/16 1658)  morphine 4 MG/ML injection 4 mg (4 mg Intravenous Given 04/27/16 1316)  ondansetron (ZOFRAN) injection 4 mg (4 mg Intravenous Given  04/27/16 1316)  cefTRIAXone (ROCEPHIN) 1 g in dextrose 5 % 50 mL IVPB (0 g Intravenous Stopped 04/27/16 1346)  sodium chloride 0.9 % bolus 1,000 mL (0 mLs Intravenous Stopped 04/27/16 1420)  morphine 4 MG/ML injection 4 mg (4 mg Intravenous Given 04/27/16 1434)  lidocaine (XYLOCAINE) 2 % jelly 1 application (1 application Topical Given 04/27/16 1454)     Initial Impression / Assessment and Plan / ED Course  I have reviewed the triage vital signs and the nursing notes.  Pertinent labs & imaging results that were available during my care of the patient were reviewed by me and considered in my medical decision making (see chart for details).  Clinical Course   Labs reviewed and with low Co2, lactic acid added and elevated. Pt with uti and suspected urosepsis.  Pt has been given 2L of NS, morphine per IV x 2 with transient pain relief.  Persistent vaginal burning not relieved by trial of topical lidocaine, although appears more comfortable when re-examined prior to admission.   Discussed with Dr. Margot Ables who accepts pt for admission, temp admission orders placed.   Final Clinical Impressions(s) / ED Diagnoses   Final diagnoses:  Dysuria  UTI (lower urinary tract infection)  Sepsis, due to unspecified organism [facility])    New Prescriptions New Prescriptions   No medications on file     Burgess Amor, PA-C 04/27/16 1723    Samuel Jester, DO 04/29/16 1542

## 2016-04-27 NOTE — ED Triage Notes (Addendum)
Lower back pain/vaginal pain x2 weeks. Reports of dysuria/frequency. Patient states "it feels like something is going to fall out".

## 2016-04-27 NOTE — ED Notes (Signed)
CRITICAL VALUE ALERT  Critical value received:  Lactic Acid 3.6  Date of notification:  04/27/2016  Time of notification:  1630  Critical value read back:yes  Nurse who received alert:  Rory PercyS Ethel Meisenheimer RN  MD notified (1st page):  MD Effie ShyWentz & PA Idol   Time of first page:  1631  MD notified (2nd page):  Time of second page:  Responding MD:  MD Effie ShyWentz & PA Idol  Time MD responded:  [id number]

## 2016-04-27 NOTE — H&P (Signed)
History and Physical    [name] ZOX:[id number] DOB: Dec 03, 1955 DOA: 04/27/2016  PCP: Vertis KelchALLEN, DEBRA, NP Patient coming from: Home  Chief Complaint: Dysuria and vaginal pain  HPI: [name] is a 60 y.o. female with medical history significant of EtOH abuse, pancytopenia, cirrhosis, anemia, cholelithiasis, GI bleed, compression fractures, GERD, HTN, CVA with residual left-sided weakness, presenting for complaints of bilateral lower back pain, vaginal pain and dysuria. Symptoms initially intermittent but now constant and getting worse. Started 2 weeks ago. Intermittent hematuria. Patient states she is sexually active and denies any vaginal discharge states vaginal discomfort with urination. Denies fevers, chills, nausea, vomiting, diarrhea, rash, neck stiffness, headache, LOC, vertigo. Patient states that this back pain is different from her chronic ongoing lumbar back pain from compression fractures. Denies any numbness or tingling down the lower extremity's. Over-the-counter yeast cream without improvement in vaginal discomfort.     ED Course: Due to findings outlined below. Patient designated as meeting sepsis criteria and subsequent protocol initiated.  Review of Systems: As per HPI otherwise 10 point review of systems negative.   Past Medical History:  Diagnosis Date  . Alcohol abuse    quit 05/23/15  . Alcoholic cirrhosis (HCC)    05/2015  . Alcoholic dementia (HCC)    05/2015,??? patient reports mental status clear, possible had change related to DTs  . Anemia of chronic disease   . Cholelithiasis   . Chronic GI bleeding   . Coagulopathy (HCC)   . Compression fracture    L1, fall in 05/2015  . Compression fracture of lumbar vertebra (HCC)   . GERD (gastroesophageal reflux disease)   . Hepatic steatosis   . Hypertension   . Normal cardiac stress test 2008   myoview  . Stroke [facility](HCC) 2008   left sided weakness  . Thrombocytopenia (HCC)     Past Surgical History:    Procedure Laterality Date  . APPENDECTOMY    . BIOPSY N/A 07/26/2015   Procedure: BIOPSY;  Surgeon: Corbin Adeobert M Rourk, MD;  Location: [facility];  Service: Endoscopy;  Laterality: N/A;  Gastric, Esophageal  . COLONOSCOPY     ???  . ESOPHAGOGASTRODUODENOSCOPY  04/2007   RMR: tight peptic stricture status post dilation, antral erosions, superficial ulcerations, submucosal duodenal mass, gastric biopsy positive for H. pylori, duodenal biopsy benign, completed H. pylori treatment  . ESOPHAGOGASTRODUODENOSCOPY  07/2007   RMR: peptic stricture status post dilation, gastric erosions, 2 cm submucosal duodenal mass. Biopsies negative.referred for endoscopic ultrasound of submucosal duodenal mass but unclear whether this was completed.  . ESOPHAGOGASTRODUODENOSCOPY (EGD) WITH PROPOFOL N/A 07/26/2015   RMR: Abnoramal esophagus/short distal peptic stricture-status post dilation with scope passage- status post biopsy. No varices. Hiatal hernia. Abnormal stomach-status post biopsy. duodenal mass of uncertain significance.     Social History   Social History  . Marital status: Married    Spouse name: N/A  . Number of children: 2  . Years of education: N/A   Occupational History  . Not on file.   Social History Main Topics  . Smoking status: Current Every Day Smoker    Packs/day: 1.50    Years: 40.00    Types: Cigarettes  . Smokeless tobacco: Never Used  . Alcohol use 12.0 oz/week    20 Shots of liquor per week     Comment: quit etoh 05/23/15. used to drink 1/2 gallon vodka in two days   . Drug use: No  . Sexual activity: Not on file   Other  Topics Concern  . Not on file   Social History Narrative  . No narrative on file    Allergies  Allergen Reactions  . Loratadine Rash    Family History  Problem Relation Age of Onset  . Leukemia Mother   . Cirrhosis Paternal Uncle     etoh  . Colon cancer Neg Hx     Prior to Admission medications   Medication Sig Start Date End Date Taking?  Authorizing Provider  acetaminophen-codeine (TYLENOL #3) 300-30 MG tablet Take 1 tablet by mouth every 6 (six) hours as needed for moderate pain.   Yes Historical Provider, MD  albuterol (PROVENTIL HFA;VENTOLIN HFA) 108 (90 BASE) MCG/ACT inhaler Inhale 1-2 puffs into the lungs every 6 (six) hours as needed for wheezing. 06/02/15 01/31/19 Yes Lesle Chris Black, NP  feeding supplement, ENSURE ENLIVE, (ENSURE ENLIVE) LIQD Take 237 mLs by mouth 2 (two) times daily between meals. 06/02/15  Yes Gwenyth Bender, NP    Physical Exam: Vitals:   04/27/16 1700 04/27/16 1715 04/27/16 1730 04/27/16 1800  BP: 132/74  125/69 129/65  Pulse: 101  81 96  Resp: 19  20 15   Temp:  97.6 F (36.4 C)    TempSrc:      SpO2: 95%  95% 90%  Weight:      Height:         General:  Appears calm but ill. Resting in bed Eyes:  PERRL, EOMI, normal lids, iris ENT:  grossly normal hearing, lips & tongue, mmm Neck:  no LAD, masses or thyromegaly Cardiovascular:  RRR, III/VI systolic murmur, 1+ LE edema.  Respiratory:  CTA bilaterally, no w/r/r. Normal respiratory effort. Abdomen:  soft, ntnd, NABS Skin: Mild Jaundice. No rash or induration seen on limited exam Musculoskeletal:  grossly normal tone RUE/RLE,no bony abnormality Psychiatric:  grossly normal mood and affect, speech fluent and appropriate, AOx3 Neurologic:  CN 2-12 grossly intact, sensation intact  Labs on Admission: I have personally reviewed following labs and imaging studies  CBC:  Recent Labs Lab 04/27/16 1317  WBC 4.6  NEUTROABS 1.9  HGB 10.7*  HCT 31.9*  MCV 124.6*  PLT 34*   Basic Metabolic Panel:  Recent Labs Lab 04/27/16 1317  NA 130*  K 3.4*  CL 101  CO2 19*  GLUCOSE 83  BUN 10  CREATININE 0.85  CALCIUM 8.0*   GFR: Estimated Creatinine Clearance: 65.6 mL/min (by C-G formula based on SCr of 0.85 mg/dL). Liver Function Tests:  Recent Labs Lab 04/27/16 1538  AST 117*  ALT 21  ALKPHOS 120  BILITOT 2.3*  PROT 7.6  ALBUMIN  2.4*   No results for input(s): LIPASE, AMYLASE in the last 168 hours. No results for input(s): AMMONIA in the last 168 hours. Coagulation Profile: No results for input(s): INR, PROTIME in the last 168 hours. Cardiac Enzymes: No results for input(s): CKTOTAL, CKMB, CKMBINDEX, TROPONINI in the last 168 hours. BNP (last 3 results) No results for input(s): PROBNP in the last 8760 hours. HbA1C: No results for input(s): HGBA1C in the last 72 hours. CBG: No results for input(s): GLUCAP in the last 168 hours. Lipid Profile: No results for input(s): CHOL, HDL, LDLCALC, TRIG, CHOLHDL, LDLDIRECT in the last 72 hours. Thyroid Function Tests: No results for input(s): TSH, T4TOTAL, FREET4, T3FREE, THYROIDAB in the last 72 hours. Anemia Panel: No results for input(s): VITAMINB12, FOLATE, FERRITIN, TIBC, IRON, RETICCTPCT in the last 72 hours. Urine analysis:    Component Value Date/Time   COLORURINE YELLOW  04/27/2016 1212   APPEARANCEUR CLOUDY (A) 04/27/2016 1212   LABSPEC 1.010 04/27/2016 1212   PHURINE 5.5 04/27/2016 1212   GLUCOSEU NEGATIVE 04/27/2016 1212   HGBUR MODERATE (A) 04/27/2016 1212   BILIRUBINUR NEGATIVE 04/27/2016 1212   KETONESUR NEGATIVE 04/27/2016 1212   PROTEINUR 30 (A) 04/27/2016 1212   UROBILINOGEN 2.0 (H) 06/25/2015 1746   NITRITE NEGATIVE 04/27/2016 1212   LEUKOCYTESUR MODERATE (A) 04/27/2016 1212    Creatinine Clearance: Estimated Creatinine Clearance: 65.6 mL/min (by C-G formula based on SCr of 0.85 mg/dL).  Sepsis Labs: @LABRCNTIP (procalcitonin:4,lacticidven:4) ) Recent Results (from the past 240 hour(s))  Wet prep, genital     Status: None   Collection Time: 04/27/16  2:45 PM  Result Value Ref Range Status   Yeast Wet Prep HPF POC NONE SEEN NONE SEEN Final   Trich, Wet Prep NONE SEEN NONE SEEN Final   Clue Cells Wet Prep HPF POC NONE SEEN NONE SEEN Final   WBC, Wet Prep HPF POC NONE SEEN NONE SEEN Final   Sperm NONE SEEN  Final     Radiological Exams  on Admission: Ct Renal Stone Study  Result Date: 04/27/2016 CLINICAL DATA:  Dysuria, urethral pain, history kidney stones, hypertension, alcoholic cirrhosis, GERD, appendectomy, GI bleeding EXAM: CT ABDOMEN AND PELVIS WITHOUT CONTRAST TECHNIQUE: Multidetector CT imaging of the abdomen and pelvis was performed following the standard protocol without IV contrast. Sagittal and coronal MPR images reconstructed from axial data set. COMPARISON:  05/25/2015 FINDINGS: Lower chest:  Minimal bibasilar atelectasis Hepatobiliary: Cirrhotic liver with few tiny scattered low-attenuation foci, nonspecific, grossly unchanged. Numerous calculi within gallbladder. No discrete hepatic mass. Pancreas: Normal appearance Spleen: Normal appearance Adrenals/Urinary Tract: Slight thickening of LEFT adrenal gland without discrete adrenal mass. Cyst anterior LEFT kidney inferior pole 3.0 x 2.3 cm grossly unchanged. Kidneys otherwise normal appearance. Additional mass, hydronephrosis, or urinary tract calcification. Mild scattered bladder wall thickening with mild infiltration of perivesicular fat question cystitis. Stomach/Bowel: Small fat attenuation focus within duodenum 7 x 6 x 14 mm question tiny duodenal lipoma. Sigmoid diverticulosis. Stomach and bowel loops otherwise normal appearance. Appendix surgically absent by history. Vascular/Lymphatic: Atherosclerotic calcifications aorta, iliac arteries, coronary arteries. Numerous pelvic phleboliths. No enlarged abdominal or pelvic nodes. Reproductive: Calcified leiomyoma within uterus. Uterus and adnexa otherwise unremarkable. Other: Mild hazy infiltrative changes identified adjacent to small normal size lymph nodes at celiac axis question minimal adenitis or inflammatory process. No abscess collection, free air or free fluid. No hernia. Musculoskeletal: Marked compression deformity of L1 vertebral body with vertebra plana configuration focal kyphosis, progressive since 05/25/2015. Bones  appear demineralized. IMPRESSION: Mild bladder wall thickening with infiltration of perivesicular fat raising question of cystitis. Sigmoid diverticulosis without evidence of diverticulitis. Marked compression fracture of L1 vertebral body with vertebra plana configuration associated with focal kyphosis, progressive since 2016. Minimal hazy infiltrative changes adjacent to small normal sized lymph nodes at the celiac access raising question of adenitis or nonspecific inflammatory process, of uncertain relationship to cystitis identified in pelvis. Electronically Signed   By: Ulyses Southward M.D.   On: 04/27/2016 15:22     Assessment/Plan Active Problems:   Hyponatremia   Macrocytic anemia   Alcoholic cirrhosis (HCC)   ETOH abuse   UTI (lower urinary tract infection)   Sepsis (HCC)   Tobacco use   Sepsis: Likely urologic etiology. Tachycardia, tachypnea > 25, lactic acid 3.6, CT showing increased bilateral thickness consistent with cystitis with adenopathy. PT also cirrhotic and unable to mount proper immune response. Multiple  admissions in past for similar presentation.  - Tele - BCX, UCX, - CTX - sepsis protocol - IVF  ETOH abuse: continues to drink. ETOH 63 on admission - CIWA  CIrrhosis: ETOH abuse. MELD score 19. Pt followed by Dr. Jena Gaussourk, but has been a no show at several appointments. Pt scheduled for EGD but did not make it to appt. Pt not on any controller medications likely due to not following up w/ GI. - Consider GI consult after acute illness if prolonged stay in hospital - FOBT - Coags   Vaginal pain: likely from urethral irritation. Wet prep neg.  - Pyridium - f/u STD panel sent by EDP  Macrocytic Anemia / Thrombocytopenia: likely from profound nutritional inadequacies from ETOH abuse and ongoing cirrhosis - anemia panel - CBC in am - folate/Thiamine/MV/Amp D50x1  Hyponatremia: - NS IVF  Chronic pain: - continue T#3  Tobacco: - nicotine patch     DVT  prophylaxis: SCD  Code Status: FULL  Family Communication: son and friend  Disposition Plan: pending improvement  Consults called: none  Admission status: inpt    MERRELL, DAVID J MD [facility]  If 7PM-7AM, please contact night-coverage [email or web address] Password [email or web address]  04/27/2016, 6:56 PM

## 2016-04-27 NOTE — ED Notes (Signed)
Pt c/o lower back pain radiating into pelvis and down between legs. Pt also reports burning with urination and hematuria. Denies n/v/d. nad noted.

## 2016-04-28 DIAGNOSIS — E871 Hypo-osmolality and hyponatremia: Secondary | ICD-10-CM

## 2016-04-28 DIAGNOSIS — F1010 Alcohol abuse, uncomplicated: Secondary | ICD-10-CM

## 2016-04-28 DIAGNOSIS — N390 Urinary tract infection, site not specified: Secondary | ICD-10-CM

## 2016-04-28 DIAGNOSIS — A419 Sepsis, unspecified organism: Secondary | ICD-10-CM

## 2016-04-28 DIAGNOSIS — D539 Nutritional anemia, unspecified: Secondary | ICD-10-CM

## 2016-04-28 LAB — COMPREHENSIVE METABOLIC PANEL
ALK PHOS: 112 U/L (ref 38–126)
ALT: 17 U/L (ref 14–54)
ANION GAP: 6 (ref 5–15)
AST: 71 U/L — ABNORMAL HIGH (ref 15–41)
Albumin: 2.4 g/dL — ABNORMAL LOW (ref 3.5–5.0)
BILIRUBIN TOTAL: 2.2 mg/dL — AB (ref 0.3–1.2)
BUN: 10 mg/dL (ref 6–20)
CALCIUM: 7.9 mg/dL — AB (ref 8.9–10.3)
CO2: 22 mmol/L (ref 22–32)
Chloride: 108 mmol/L (ref 101–111)
Creatinine, Ser: 0.79 mg/dL (ref 0.44–1.00)
GLUCOSE: 90 mg/dL (ref 65–99)
Potassium: 3.7 mmol/L (ref 3.5–5.1)
Sodium: 136 mmol/L (ref 135–145)
TOTAL PROTEIN: 7.5 g/dL (ref 6.5–8.1)

## 2016-04-28 LAB — CBC
HCT: 27.5 % — ABNORMAL LOW (ref 36.0–46.0)
HEMOGLOBIN: 9.1 g/dL — AB (ref 12.0–15.0)
MCH: 42.3 pg — ABNORMAL HIGH (ref 26.0–34.0)
MCHC: 33.1 g/dL (ref 30.0–36.0)
MCV: 127.9 fL — ABNORMAL HIGH (ref 78.0–100.0)
Platelets: 26 10*3/uL — CL (ref 150–400)
RBC: 2.15 MIL/uL — AB (ref 3.87–5.11)
RDW: 21.6 % — ABNORMAL HIGH (ref 11.5–15.5)
WBC: 7.1 10*3/uL (ref 4.0–10.5)

## 2016-04-28 MED ORDER — PROMETHAZINE HCL 25 MG/ML IJ SOLN
6.2500 mg | Freq: Three times a day (TID) | INTRAMUSCULAR | Status: DC | PRN
  Administered 2016-04-28: 6.25 mg via INTRAVENOUS
  Filled 2016-04-28: qty 1

## 2016-04-28 MED ORDER — PROMETHAZINE HCL 12.5 MG PO TABS: 6.2500 mg | ORAL_TABLET | Freq: Three times a day (TID) | ORAL | Status: DC | PRN

## 2016-04-28 MED ORDER — MAGIC MOUTHWASH: 5.0000 mL | ORAL | Status: DC | PRN

## 2016-04-28 MED ORDER — LIDOCAINE VISCOUS 2 % MT SOLN
5.0000 mL | OROMUCOSAL | Status: DC | PRN
  Administered 2016-04-28: 5 mL via OROMUCOSAL
  Filled 2016-04-28: qty 15

## 2016-04-28 MED ORDER — LABETALOL HCL 5 MG/ML IV SOLN
5.0000 mg | INTRAVENOUS | Status: DC | PRN
  Administered 2016-04-28 – 2016-04-30 (×3): 5 mg via INTRAVENOUS
  Filled 2016-04-28 (×2): qty 4

## 2016-04-28 MED ORDER — MAGIC MOUTHWASH W/LIDOCAINE: 10.0000 mL | Freq: Four times a day (QID) | ORAL | Status: DC | PRN

## 2016-04-28 MED ORDER — PROMETHAZINE HCL 12.5 MG RE SUPP
6.2500 mg | Freq: Three times a day (TID) | RECTAL | Status: DC | PRN
  Filled 2016-04-28: qty 1

## 2016-04-28 MED ORDER — SODIUM CHLORIDE 0.9 % IV BOLUS (SEPSIS)
500.0000 mL | Freq: Once | INTRAVENOUS | Status: AC
  Administered 2016-04-28: 500 mL via INTRAVENOUS

## 2016-04-28 NOTE — Progress Notes (Signed)
1341 c/o mouth/gum pain & is requesting oral numbing med. MD notified. Oral swabs and mouth care supplies provided to patient.

## 2016-04-28 NOTE — Progress Notes (Signed)
CRITICAL VALUE ALERT  Critical value received:  Platelets - 26  Date of notification:  04/28/2016  Time of notification:  0742  Critical value read back:Yes.    Nurse who received alert:  Zara Chessrystal Lizbett Garciagarcia, RN  MD notified (1st page):  Dr.Chiu   Time of first page:  [phone]  MD notified (2nd page):  Time of second page:  Responding MD:    Time MD responded:

## 2016-04-28 NOTE — Progress Notes (Signed)
PROGRESS NOTE    [name]  ZOX:[id number] DOB: 11/25/1955 DOA: 04/27/2016 PCP: Vertis KelchALLEN, DEBRA, NP    Brief Narrative:  60 y.o. female with medical history significant of EtOH abuse, pancytopenia, cirrhosis, anemia, cholelithiasis, GI bleed, compression fractures, GERD, HTN, CVA with residual left-sided weakness, presenting for complaints of bilateral lower back pain, vaginal pain and dysuria. Symptoms initially intermittent but now constant and getting worse. Started 2 weeks ago. Intermittent hematuria. Patient states she is sexually active and denies any vaginal discharge states vaginal discomfort with urination. Denies fevers, chills, nausea, vomiting, diarrhea, rash, neck stiffness, headache, LOC, vertigo. Patient states that this back pain is different from her chronic ongoing lumbar back pain from compression fractures. Denies any numbness or tingling down the lower extremity's. Over-the-counter yeast cream without improvement in vaginal discomfort.  Assessment & Plan:   Active Problems:   Hyponatremia   Macrocytic anemia   Alcoholic cirrhosis (HCC)   ETOH abuse   UTI (lower urinary tract infection)   Sepsis (HCC)   Tobacco use   Likely UTI with Sepsis present on admission: Likely urologic etiology. Tachycardia, tachypnea > 25, lactic acid 3.6, CT showing increased bilateral thickness consistent with cystitis with adenopathy. PT also cirrhotic and unable to mount proper immune response. Multiple admissions in past for similar presentation.  -Patient is currently continued on empiric Rocephin. -Clinically still septic. -Continue aggressive IV fluids as tolerated.  ETOH abuse: continues to drink. ETOH 63 on admission - Continue CIWA  Alcoholic cirrhosis. MELD score 19. Pt followed by Dr. Jena Gaussourk, but has been a no show at several appointments. Pt scheduled for EGD but did not make it to appt. Pt not on any controller medications likely due to not following up w/ GI. - Continue  to follow comprehensive metabolic panel/liver profile -Avoid hepatotoxic agents  Vaginal pain: likely from urethral irritation. Wet prep neg.  - Continue Pyridium - f/u STD panel sent by EDP  Macrocytic Anemia / Thrombocytopenia: likely from profound nutritional inadequacies from ETOH abuse and ongoing cirrhosis - Continue folate/Thiamine/MV/Amp D50x1  Hyponatremia: - Continue NS IVF  Chronic pain: - continue T#3  Tobacco: - nicotine patch while admitted  DVT prophylaxis: SCDs Code Status: Full code Family Communication: Agent and room, family at bedside Disposition Plan: Uncertain at this time  Consultants:     Procedures:     Antimicrobials: Anti-infectives    Start     Dose/Rate Route Frequency Ordered Stop   04/28/16 1300  cefTRIAXone (ROCEPHIN) 1 g in dextrose 5 % 50 mL IVPB     1 g 100 mL/hr over 30 Minutes Intravenous Every 24 hours 04/27/16 1855     04/27/16 1315  cefTRIAXone (ROCEPHIN) 1 g in dextrose 5 % 50 mL IVPB     1 g 100 mL/hr over 30 Minutes Intravenous  Once 04/27/16 1302 04/27/16 1346      Subjective: Feeling nauseated at this time.  Objective: Vitals:   04/27/16 2302 04/28/16 0524 04/28/16 0809 04/28/16 1417  BP: 138/66 114/68  (!) 105/56  Pulse: 84 (!) 120 (!) 155 (!) 110  Resp: 20 20  18   Temp: 98.2 F (36.8 C) 98.9 F (37.2 C)  99.4 F (37.4 C)  TempSrc: Oral Oral    SpO2: 100% 94%  99%  Weight:      Height:        Intake/Output Summary (Last 24 hours) at 04/28/16 1440 Last data filed at 04/28/16 0900  Gross per 24 hour  Intake  240 ml  Output              100 ml  Net              140 ml   Filed Weights   04/27/16 1158  Weight: 57.6 kg (127 lb)    Examination:  General exam: Appears calm and comfortable, Lying in bed  Respiratory system: Clear to auscultation. Respiratory effort normal. Cardiovascular system: S1 & S2 heard, tachycardic Gastrointestinal system: Abdomen is nondistended, soft and  nontender. No organomegaly or masses felt. Normal bowel sounds heard. Central nervous system: Alert and oriented. No focal neurological deficits. Extremities: Symmetric 5 x 5 power. Skin: No rashes, lesions Psychiatry: Judgement and insight appear normal. Mood & affect appropriate.   Data Reviewed: I have personally reviewed following labs and imaging studies  CBC:  Recent Labs Lab 04/27/16 1317 04/28/16 0713  WBC 4.6 7.1  NEUTROABS 1.9  --   HGB 10.7* 9.1*  HCT 31.9* 27.5*  MCV 124.6* 127.9*  PLT 34* 26*   Basic Metabolic Panel:  Recent Labs Lab 04/27/16 1317 04/27/16 1704 04/28/16 0713  NA 130*  --  136  K 3.4*  --  3.7  CL 101  --  108  CO2 19*  --  22  GLUCOSE 83  --  90  BUN 10  --  10  CREATININE 0.85  --  0.79  CALCIUM 8.0*  --  7.9*  MG  --  1.4*  --   PHOS  --  2.6  --    GFR: Estimated Creatinine Clearance: 69.7 mL/min (by C-G formula based on SCr of 0.8 mg/dL). Liver Function Tests:  Recent Labs Lab 04/27/16 1538 04/28/16 0713  AST 117* 71*  ALT 21 17  ALKPHOS 120 112  BILITOT 2.3* 2.2*  PROT 7.6 7.5  ALBUMIN 2.4* 2.4*   No results for input(s): LIPASE, AMYLASE in the last 168 hours. No results for input(s): AMMONIA in the last 168 hours. Coagulation Profile:  Recent Labs Lab 04/27/16 1704  INR 1.20   Cardiac Enzymes:  Recent Labs Lab 04/27/16 1704  TROPONINI <0.03   BNP (last 3 results) No results for input(s): PROBNP in the last 8760 hours. HbA1C: No results for input(s): HGBA1C in the last 72 hours. CBG: No results for input(s): GLUCAP in the last 168 hours. Lipid Profile: No results for input(s): CHOL, HDL, LDLCALC, TRIG, CHOLHDL, LDLDIRECT in the last 72 hours. Thyroid Function Tests: No results for input(s): TSH, T4TOTAL, FREET4, T3FREE, THYROIDAB in the last 72 hours. Anemia Panel: No results for input(s): VITAMINB12, FOLATE, FERRITIN, TIBC, IRON, RETICCTPCT in the last 72 hours. Sepsis Labs:  Recent Labs Lab  04/27/16 1538 04/27/16 1704 04/27/16 2123  PROCALCITON  --  5.68  --   LATICACIDVEN 3.6* 3.0* 2.3*    Recent Results (from the past 240 hour(s))  Wet prep, genital     Status: None   Collection Time: 04/27/16  2:45 PM  Result Value Ref Range Status   Yeast Wet Prep HPF POC NONE SEEN NONE SEEN Final   Trich, Wet Prep NONE SEEN NONE SEEN Final   Clue Cells Wet Prep HPF POC NONE SEEN NONE SEEN Final   WBC, Wet Prep HPF POC NONE SEEN NONE SEEN Final   Sperm NONE SEEN  Final  Blood Culture (routine x 2)     Status: None (Preliminary result)   Collection Time: 04/27/16  5:03 PM  Result Value Ref Range Status   Specimen  Description BLOOD LEFT ANTECUBITAL  Final   Special Requests   Final    BOTTLES DRAWN AEROBIC AND ANAEROBIC AEB=10CC ANA=5CC   Culture NO GROWTH < 24 HOURS  Final   Report Status PENDING  Incomplete  Blood Culture (routine x 2)     Status: None (Preliminary result)   Collection Time: 04/27/16  5:04 PM  Result Value Ref Range Status   Specimen Description BLOOD RIGHT HAND  Final   Special Requests BOTTLES DRAWN AEROBIC AND ANAEROBIC 5CC EACH  Final   Culture NO GROWTH < 24 HOURS  Final   Report Status PENDING  Incomplete     Radiology Studies: Ct Renal Stone Study  Result Date: 04/27/2016 CLINICAL DATA:  Dysuria, urethral pain, history kidney stones, hypertension, alcoholic cirrhosis, GERD, appendectomy, GI bleeding EXAM: CT ABDOMEN AND PELVIS WITHOUT CONTRAST TECHNIQUE: Multidetector CT imaging of the abdomen and pelvis was performed following the standard protocol without IV contrast. Sagittal and coronal MPR images reconstructed from axial data set. COMPARISON:  05/25/2015 FINDINGS: Lower chest:  Minimal bibasilar atelectasis Hepatobiliary: Cirrhotic liver with few tiny scattered low-attenuation foci, nonspecific, grossly unchanged. Numerous calculi within gallbladder. No discrete hepatic mass. Pancreas: Normal appearance Spleen: Normal appearance Adrenals/Urinary  Tract: Slight thickening of LEFT adrenal gland without discrete adrenal mass. Cyst anterior LEFT kidney inferior pole 3.0 x 2.3 cm grossly unchanged. Kidneys otherwise normal appearance. Additional mass, hydronephrosis, or urinary tract calcification. Mild scattered bladder wall thickening with mild infiltration of perivesicular fat question cystitis. Stomach/Bowel: Small fat attenuation focus within duodenum 7 x 6 x 14 mm question tiny duodenal lipoma. Sigmoid diverticulosis. Stomach and bowel loops otherwise normal appearance. Appendix surgically absent by history. Vascular/Lymphatic: Atherosclerotic calcifications aorta, iliac arteries, coronary arteries. Numerous pelvic phleboliths. No enlarged abdominal or pelvic nodes. Reproductive: Calcified leiomyoma within uterus. Uterus and adnexa otherwise unremarkable. Other: Mild hazy infiltrative changes identified adjacent to small normal size lymph nodes at celiac axis question minimal adenitis or inflammatory process. No abscess collection, free air or free fluid. No hernia. Musculoskeletal: Marked compression deformity of L1 vertebral body with vertebra plana configuration focal kyphosis, progressive since 05/25/2015. Bones appear demineralized. IMPRESSION: Mild bladder wall thickening with infiltration of perivesicular fat raising question of cystitis. Sigmoid diverticulosis without evidence of diverticulitis. Marked compression fracture of L1 vertebral body with vertebra plana configuration associated with focal kyphosis, progressive since 2016. Minimal hazy infiltrative changes adjacent to small normal sized lymph nodes at the celiac access raising question of adenitis or nonspecific inflammatory process, of uncertain relationship to cystitis identified in pelvis. Electronically Signed   By: Ulyses SouthwardMark  Boles M.D.   On: 04/27/2016 15:22    Scheduled Meds: . cefTRIAXone (ROCEPHIN)  IV  1 g Intravenous Q24H  . feeding supplement (ENSURE ENLIVE)  237 mL Oral BID BM    . folic acid  1 mg Oral Daily  . multivitamin with minerals  1 tablet Oral Daily  . nicotine  21 mg Transdermal Daily  . sodium chloride flush  3 mL Intravenous Q12H  . thiamine  100 mg Oral Daily   Or  . thiamine  100 mg Intravenous Daily   Continuous Infusions: . sodium chloride 125 mL/hr at 04/28/16 0814     LOS: 1 day   Valina Maes, Scheryl MartenSTEPHEN K, MD [facility] Pager [phone]  If 7PM-7AM, please contact night-coverage [email or web address] Password [email or web address] 04/28/2016, 2:40 PM

## 2016-04-28 NOTE — Progress Notes (Signed)
0830 Patient's heart rate noted between 115-118 and decreased from 155, PRN Labetalol IV push effective.

## 2016-04-28 NOTE — Progress Notes (Signed)
0750 central telemetry [facility] called to report that the patient's HR has sustained in the 140's for the last 5mins. MD notified.

## 2016-04-29 DIAGNOSIS — A419 Sepsis, unspecified organism: Principal | ICD-10-CM

## 2016-04-29 DIAGNOSIS — K7030 Alcoholic cirrhosis of liver without ascites: Secondary | ICD-10-CM

## 2016-04-29 DIAGNOSIS — N390 Urinary tract infection, site not specified: Secondary | ICD-10-CM

## 2016-04-29 LAB — CBC
HCT: 26.1 % — ABNORMAL LOW (ref 36.0–46.0)
HEMOGLOBIN: 8.5 g/dL — AB (ref 12.0–15.0)
MCH: 43.1 pg — AB (ref 26.0–34.0)
MCHC: 32.6 g/dL (ref 30.0–36.0)
MCV: 132.5 fL — ABNORMAL HIGH (ref 78.0–100.0)
Platelets: 27 10*3/uL — CL (ref 150–400)
RBC: 1.97 MIL/uL — ABNORMAL LOW (ref 3.87–5.11)
RDW: 21.7 % — ABNORMAL HIGH (ref 11.5–15.5)
WBC: 6.7 10*3/uL (ref 4.0–10.5)

## 2016-04-29 LAB — COMPREHENSIVE METABOLIC PANEL
ALBUMIN: 2.2 g/dL — AB (ref 3.5–5.0)
ALK PHOS: 104 U/L (ref 38–126)
ALT: 17 U/L (ref 14–54)
ANION GAP: 7 (ref 5–15)
AST: 71 U/L — ABNORMAL HIGH (ref 15–41)
BUN: 10 mg/dL (ref 6–20)
CALCIUM: 7.7 mg/dL — AB (ref 8.9–10.3)
CO2: 23 mmol/L (ref 22–32)
Chloride: 106 mmol/L (ref 101–111)
Creatinine, Ser: 0.86 mg/dL (ref 0.44–1.00)
GFR calc Af Amer: >60 mL/min (ref >60)
GFR calc non Af Amer: >60 mL/min (ref >60)
GLUCOSE: 87 mg/dL (ref 65–99)
Potassium: 3.5 mmol/L (ref 3.5–5.1)
SODIUM: 136 mmol/L (ref 135–145)
Total Bilirubin: 1.9 mg/dL — ABNORMAL HIGH (ref 0.3–1.2)
Total Protein: 6.7 g/dL (ref 6.5–8.1)

## 2016-04-29 MED ORDER — ACETAMINOPHEN 325 MG PO TABS
325.0000 mg | ORAL_TABLET | Freq: Four times a day (QID) | ORAL | Status: AC | PRN
  Administered 2016-04-29: 325 mg via ORAL
  Filled 2016-04-29: qty 1

## 2016-04-29 NOTE — Progress Notes (Signed)
PROGRESS NOTE    Debbie ElseDeborah C Valentine  ZOX:[id number] DOB: [birth year] DOA: 04/27/2016 PCP: Vertis KelchALLEN, DEBRA, NP    Brief Narrative:  60 y.o. female with medical history significant of EtOH abuse, pancytopenia, cirrhosis, anemia, cholelithiasis, GI bleed, compression fractures, GERD, HTN, CVA with residual left-sided weakness, presenting for complaints of bilateral lower back pain, vaginal pain and dysuria. Symptoms initially intermittent but now constant and getting worse. Started 2 weeks ago. Intermittent hematuria. Patient states she is sexually active and denies any vaginal discharge states vaginal discomfort with urination. Denies fevers, chills, nausea, vomiting, diarrhea, rash, neck stiffness, headache, LOC, vertigo. Patient states that this back pain is different from her chronic ongoing lumbar back pain from compression fractures. Denies any numbness or tingling down the lower extremity's. Over-the-counter yeast cream without improvement in vaginal discomfort.  Assessment & Plan:   Principal Problem:   Sepsis secondary to UTI [facility](HCC) Active Problems:   Hyponatremia   Macrocytic anemia   Alcoholic cirrhosis (HCC)   ETOH abuse   UTI (lower urinary tract infection)   Sepsis (HCC)   Tobacco use   Likely Ecoli and klebsiella UTI with Sepsis present on admission: Likely urologic etiology. Tachycardia, tachypnea > 25, lactic acid 3.6, CT showing increased bilateral thickness consistent with cystitis with adenopathy. PT also cirrhotic and unable to mount proper immune response. Pt has multiple admissions in past for similar presentation.  -Patient is currently continued on empiric Rocephin. -Patient is improving, although still septic clinically -Continue aggressive IV fluids as tolerated. Cont abx per above -Tmax of 101 this AM at 0456 -urine cx has demonstrated >100,000 ecoli and >100,000 klebsiella species, pending sensitivities  ETOH abuse: continues to drink. ETOH 63 on admission -  Continue CIWA -Stable at present  Alcoholic cirrhosis. MELD score 19. Pt followed by Dr. Jena Gaussourk, but has been a no show at several appointments. Pt scheduled for EGD but did not make it to appt. Pt not on any controller medications likely due to not following up w/ GI. - Continue to follow comprehensive metabolic panel/liver profile -Avoid hepatotoxic agents  Vaginal pain: likely from urethral irritation. Wet prep neg.  - Continue Pyridium - f/u STD panel sent by EDP  Macrocytic Anemia / Thrombocytopenia: likely from profound nutritional inadequacies from ETOH abuse and ongoing cirrhosis - Continue folate/Thiamine/MV/Amp D50x1  Hyponatremia: - Continue NS IVF  Chronic pain: - continue T#3  Tobacco: - continue nicotine patch while admitted  DVT prophylaxis: SCDs Code Status: Full code Family Communication: Agent and room, family at bedside Disposition Plan: Uncertain at this time  Consultants:     Procedures:     Antimicrobials: Anti-infectives    Start     Dose/Rate Route Frequency Ordered Stop   04/28/16 1300  cefTRIAXone (ROCEPHIN) 1 g in dextrose 5 % 50 mL IVPB     1 g 100 mL/hr over 30 Minutes Intravenous Every 24 hours 04/27/16 1855     04/27/16 1315  cefTRIAXone (ROCEPHIN) 1 g in dextrose 5 % 50 mL IVPB     1 g 100 mL/hr over 30 Minutes Intravenous  Once 04/27/16 1302 04/27/16 1346      Subjective: Very eager to go home.  Objective: Vitals:   04/28/16 0809 04/28/16 1417 04/29/16 0456 04/29/16 0925  BP:  (!) 105/56 (!) 99/59 (!) 107/53  Pulse: (!) 155 (!) 110 (!) 118 (!) 109  Resp:  18 18 18   Temp:  99.4 F (37.4 C) (!) 101 F (38.3 C) 99.9 F (37.7 C)  TempSrc:  Oral Oral  SpO2:  99% 99% 99%  Weight:      Height:        Intake/Output Summary (Last 24 hours) at 04/29/16 1343 Last data filed at 04/29/16 1241  Gross per 24 hour  Intake          2979.17 ml  Output                0 ml  Net          2979.17 ml   Filed Weights   04/27/16  1158  Weight: 57.6 kg (127 lb)    Examination:  General exam: Appears calm and comfortable, Lying in bed asleep, easily arousable  Respiratory system: Clear to auscultation. Respiratory effort normal. Cardiovascular system: S1 & S2 heard, tachycardic Gastrointestinal system: Abdomen is nondistended, soft and nontender. No organomegaly or masses felt. Normal bowel sounds heard. Central nervous system: Alert and oriented. No focal neurological deficits. Extremities: Symmetric 5 x 5 power. Skin: No rashes, lesions Psychiatry: Judgement and insight appear normal. Mood & affect appropriate, no auditory/visual hallucinations   Data Reviewed: I have personally reviewed following labs and imaging studies  CBC:  Recent Labs Lab 04/27/16 1317 04/28/16 0713 04/29/16 0607  WBC 4.6 7.1 6.7  NEUTROABS 1.9  --   --   HGB 10.7* 9.1* 8.5*  HCT 31.9* 27.5* 26.1*  MCV 124.6* 127.9* 132.5*  PLT 34* 26* 27*   Basic Metabolic Panel:  Recent Labs Lab 04/27/16 1317 04/27/16 1704 04/28/16 0713 04/29/16 0607  NA 130*  --  136 136  K 3.4*  --  3.7 3.5  CL 101  --  108 106  CO2 19*  --  22 23  GLUCOSE 83  --  90 87  BUN 10  --  10 10  CREATININE 0.85  --  0.79 0.86  CALCIUM 8.0*  --  7.9* 7.7*  MG  --  1.4*  --   --   PHOS  --  2.6  --   --    GFR: Estimated Creatinine Clearance: 64.8 mL/min (by C-G formula based on SCr of 0.86 mg/dL). Liver Function Tests:  Recent Labs Lab 04/27/16 1538 04/28/16 0713 04/29/16 0607  AST 117* 71* 71*  ALT 21 17 17   ALKPHOS 120 112 104  BILITOT 2.3* 2.2* 1.9*  PROT 7.6 7.5 6.7  ALBUMIN 2.4* 2.4* 2.2*   No results for input(s): LIPASE, AMYLASE in the last 168 hours. No results for input(s): AMMONIA in the last 168 hours. Coagulation Profile:  Recent Labs Lab 04/27/16 1704  INR 1.20   Cardiac Enzymes:  Recent Labs Lab 04/27/16 1704  TROPONINI <0.03   BNP (last 3 results) No results for input(s): PROBNP in the last 8760  hours. HbA1C: No results for input(s): HGBA1C in the last 72 hours. CBG: No results for input(s): GLUCAP in the last 168 hours. Lipid Profile: No results for input(s): CHOL, HDL, LDLCALC, TRIG, CHOLHDL, LDLDIRECT in the last 72 hours. Thyroid Function Tests: No results for input(s): TSH, T4TOTAL, FREET4, T3FREE, THYROIDAB in the last 72 hours. Anemia Panel: No results for input(s): VITAMINB12, FOLATE, FERRITIN, TIBC, IRON, RETICCTPCT in the last 72 hours. Sepsis Labs:  Recent Labs Lab 04/27/16 1538 04/27/16 1704 04/27/16 2123  PROCALCITON  --  5.68  --   LATICACIDVEN 3.6* 3.0* 2.3*    Recent Results (from the past 240 hour(s))  Urine culture     Status: Abnormal (Preliminary result)   Collection Time: 04/27/16 12:12 PM  Result Value Ref Range Status   Specimen Description URINE, RANDOM  Final   Special Requests NONE  Final   Culture (A)  Final    >=100,000 COLONIES/mL ESCHERICHIA COLI >=100,000 COLONIES/mL KLEBSIELLA PNEUMONIAE    Report Status PENDING  Incomplete  Wet prep, genital     Status: None   Collection Time: 04/27/16  2:45 PM  Result Value Ref Range Status   Yeast Wet Prep HPF POC NONE SEEN NONE SEEN Final   Trich, Wet Prep NONE SEEN NONE SEEN Final   Clue Cells Wet Prep HPF POC NONE SEEN NONE SEEN Final   WBC, Wet Prep HPF POC NONE SEEN NONE SEEN Final   Sperm NONE SEEN  Final  Blood Culture (routine x 2)     Status: None (Preliminary result)   Collection Time: 04/27/16  5:03 PM  Result Value Ref Range Status   Specimen Description BLOOD LEFT ANTECUBITAL  Final   Special Requests   Final    BOTTLES DRAWN AEROBIC AND ANAEROBIC AEB=10CC ANA=5CC   Culture NO GROWTH < 24 HOURS  Final   Report Status PENDING  Incomplete  Blood Culture (routine x 2)     Status: None (Preliminary result)   Collection Time: 04/27/16  5:04 PM  Result Value Ref Range Status   Specimen Description BLOOD RIGHT HAND  Final   Special Requests BOTTLES DRAWN AEROBIC AND ANAEROBIC 5CC  EACH  Final   Culture NO GROWTH < 24 HOURS  Final   Report Status PENDING  Incomplete     Radiology Studies: Ct Renal Stone Study  Result Date: 04/27/2016 CLINICAL DATA:  Dysuria, urethral pain, history kidney stones, hypertension, alcoholic cirrhosis, GERD, appendectomy, GI bleeding EXAM: CT ABDOMEN AND PELVIS WITHOUT CONTRAST TECHNIQUE: Multidetector CT imaging of the abdomen and pelvis was performed following the standard protocol without IV contrast. Sagittal and coronal MPR images reconstructed from axial data set. COMPARISON:  05/25/2015 FINDINGS: Lower chest:  Minimal bibasilar atelectasis Hepatobiliary: Cirrhotic liver with few tiny scattered low-attenuation foci, nonspecific, grossly unchanged. Numerous calculi within gallbladder. No discrete hepatic mass. Pancreas: Normal appearance Spleen: Normal appearance Adrenals/Urinary Tract: Slight thickening of LEFT adrenal gland without discrete adrenal mass. Cyst anterior LEFT kidney inferior pole 3.0 x 2.3 cm grossly unchanged. Kidneys otherwise normal appearance. Additional mass, hydronephrosis, or urinary tract calcification. Mild scattered bladder wall thickening with mild infiltration of perivesicular fat question cystitis. Stomach/Bowel: Small fat attenuation focus within duodenum 7 x 6 x 14 mm question tiny duodenal lipoma. Sigmoid diverticulosis. Stomach and bowel loops otherwise normal appearance. Appendix surgically absent by history. Vascular/Lymphatic: Atherosclerotic calcifications aorta, iliac arteries, coronary arteries. Numerous pelvic phleboliths. No enlarged abdominal or pelvic nodes. Reproductive: Calcified leiomyoma within uterus. Uterus and adnexa otherwise unremarkable. Other: Mild hazy infiltrative changes identified adjacent to small normal size lymph nodes at celiac axis question minimal adenitis or inflammatory process. No abscess collection, free air or free fluid. No hernia. Musculoskeletal: Marked compression deformity of L1  vertebral body with vertebra plana configuration focal kyphosis, progressive since 05/25/2015. Bones appear demineralized. IMPRESSION: Mild bladder wall thickening with infiltration of perivesicular fat raising question of cystitis. Sigmoid diverticulosis without evidence of diverticulitis. Marked compression fracture of L1 vertebral body with vertebra plana configuration associated with focal kyphosis, progressive since 2016. Minimal hazy infiltrative changes adjacent to small normal sized lymph nodes at the celiac access raising question of adenitis or nonspecific inflammatory process, of uncertain relationship to cystitis identified in pelvis. Electronically Signed   By: Angelyn Punt.D.  On: 04/27/2016 15:22    Scheduled Meds: . cefTRIAXone (ROCEPHIN)  IV  1 g Intravenous Q24H  . feeding supplement (ENSURE ENLIVE)  237 mL Oral BID BM  . folic acid  1 mg Oral Daily  . multivitamin with minerals  1 tablet Oral Daily  . nicotine  21 mg Transdermal Daily  . sodium chloride flush  3 mL Intravenous Q12H  . thiamine  100 mg Oral Daily   Or  . thiamine  100 mg Intravenous Daily   Continuous Infusions: . sodium chloride 125 mL/hr at 04/29/16 0013     LOS: 2 days   CHIU, Scheryl MartenSTEPHEN K, MD [facility] Pager [phone]  If 7PM-7AM, please contact night-coverage [email or web address] Password [email or web address] 04/29/2016, 1:43 PM

## 2016-04-30 LAB — URINE CULTURE: Culture: >=100000 — AB

## 2016-04-30 LAB — CBC
HEMATOCRIT: 23.5 % — AB (ref 36.0–46.0)
HEMOGLOBIN: 7.5 g/dL — AB (ref 12.0–15.0)
MCH: 42.4 pg — ABNORMAL HIGH (ref 26.0–34.0)
MCHC: 31.9 g/dL (ref 30.0–36.0)
MCV: 132.8 fL — ABNORMAL HIGH (ref 78.0–100.0)
Platelets: 27 10*3/uL — CL (ref 150–400)
RBC: 1.77 MIL/uL — ABNORMAL LOW (ref 3.87–5.11)
RDW: 21.6 % — ABNORMAL HIGH (ref 11.5–15.5)
WBC: 6.8 10*3/uL (ref 4.0–10.5)

## 2016-04-30 LAB — LACTIC ACID, PLASMA: Lactic Acid, Venous: 1.6 mmol/L (ref 0.5–1.9)

## 2016-04-30 LAB — PREPARE RBC (CROSSMATCH)

## 2016-04-30 MED ORDER — FUROSEMIDE 10 MG/ML IJ SOLN: 20.0000 mg | Freq: Once | INTRAMUSCULAR | Status: DC

## 2016-04-30 MED ORDER — SODIUM CHLORIDE 0.9 % IV SOLN
Freq: Once | INTRAVENOUS | Status: AC
  Administered 2016-04-30: 14:00:00 via INTRAVENOUS

## 2016-04-30 MED ORDER — FUROSEMIDE 10 MG/ML IJ SOLN
20.0000 mg | Freq: Once | INTRAMUSCULAR | Status: AC
  Administered 2016-04-30: 20 mg via INTRAVENOUS
  Filled 2016-04-30: qty 2

## 2016-04-30 MED ORDER — POLYETHYLENE GLYCOL 3350 17 G PO PACK
17.0000 g | PACK | Freq: Every day | ORAL | Status: DC
  Administered 2016-04-30 – 2016-05-01 (×2): 17 g via ORAL
  Filled 2016-04-30: qty 1

## 2016-04-30 MED ORDER — DIPHENHYDRAMINE HCL 25 MG PO CAPS
25.0000 mg | ORAL_CAPSULE | Freq: Once | ORAL | Status: AC
  Administered 2016-04-30: 25 mg via ORAL
  Filled 2016-04-30: qty 1

## 2016-04-30 NOTE — Progress Notes (Signed)
[name]  ZOX:[id number] DOB: 09/11/1956 DOA: 04/27/2016 PCP: Vertis Kelch, NP    Brief Narrative:  60 y.o. female with medical history significant of EtOH abuse, pancytopenia, cirrhosis, anemia, cholelithiasis, GI bleed, compression fractures, GERD, HTN, CVA with residual left-sided weakness, presenting for complaints of bilateral lower back pain, vaginal pain and dysuria. Symptoms initially intermittent but now constant and getting worse. Started 2 weeks ago. Intermittent hematuria. Patient states she is sexually active and denies any vaginal discharge states vaginal discomfort with urination. Denies fevers, chills, nausea, vomiting, diarrhea, rash, neck stiffness, headache, LOC, vertigo. Patient states that this back pain is different from her chronic ongoing lumbar back pain from compression fractures. Denies any numbness or tingling down the lower extremity's. Over-the-counter yeast cream without improvement in vaginal discomfort.  Assessment & Plan:   Principal Problem:   Sepsis secondary to UTI [facility]) Active Problems:   Hyponatremia   Macrocytic anemia   Alcoholic cirrhosis (HCC)   ETOH abuse   UTI (lower urinary tract infection)   Sepsis (HCC)   Tobacco use   Likely Ecoli and klebsiella UTI with Sepsis present on admission: Likely urologic etiology. Tachycardia, tachypnea > 25, lactic acid 3.6, CT showing increased bilateral thickness consistent with cystitis with adenopathy. PT also cirrhotic and unable to mount proper immune response. Pt has multiple admissions in past for similar presentation.  -Patient is currently continued on empiric Rocephin, micro-sensitivities confirm both organisms are sensitive to Rocephin.. -Patient is improving -Patient afebrile over 24 hours -Consider change to ciprofloxacin on discharge, per micro-sensitivities  ETOH abuse: continues to drink. ETOH 63 on admission - Continue CIWA -Stable at present  Alcoholic  cirrhosis. MELD score 19. Pt followed by Dr. Jena Gauss, but has been a no show at several appointments. Pt scheduled for EGD but did not make it to appt. Pt not on any controller medications likely due to not following up w/ GI. - Continue to follow comprehensive metabolic panel/liver profile -Avoid hepatotoxic agents -Patient reports continuing to drink ETOH -Educated patient and family regarding her ETOH liver disease and of her MELD score. Patient seems to understand the severity of her liver disease and states she will be more compliant with her GI visits and will cut back ETOH  Vaginal pain: likely from urethral irritation. Wet prep neg.  - Continue Pyridium - f/u STD panel sent by EDP  Macrocytic Anemia secondary to liver disease / Pancytopenia: likely from profound nutritional inadequacies from ETOH abuse and ongoing cirrhosis - Continue folate/Thiamine/MV/Amp D50x1 - Plts low but stable - Hgb down to 7.5 today. Will give 2 units prbc's  Hyponatremia: - Continue NS IVF  Chronic pain: - continue T#3  Tobacco: - continue nicotine patch while admitted  DVT prophylaxis: SCDs Code Status: Full code Family Communication: Agent and room, family at bedside Disposition Plan: Uncertain at this time  Consultants:     Procedures:     Antimicrobials: Anti-infectives    Start     Dose/Rate Route Frequency Ordered Stop   04/28/16 1300  cefTRIAXone (ROCEPHIN) 1 g in dextrose 5 % 50 mL IVPB     1 g 100 mL/hr over 30 Minutes Intravenous Every 24 hours 04/27/16 1855     04/27/16 1315  cefTRIAXone (ROCEPHIN) 1 g in dextrose 5 % 50 mL IVPB     1 g 100 mL/hr over 30 Minutes Intravenous  Once 04/27/16 1302 04/27/16 1346      Subjective: No complaints today  Objective: Vitals:  04/30/16 0321 04/30/16 0921 04/30/16 1457 04/30/16 1521  BP: 132/69 115/67 119/74 (!) 116/54  Pulse: 100 (!) 103 63 (!) 108  Resp: 16 20 18 20   Temp: 99 F (37.2 C) 99.1 F (37.3 C) 97.9 F (36.6  C) 99.4 F (37.4 C)  TempSrc: Oral Oral Oral Oral  SpO2: 99% 98% 99% 100%  Weight:      Height:        Intake/Output Summary (Last 24 hours) at 04/30/16 1532 Last data filed at 04/30/16 1300  Gross per 24 hour  Intake              720 ml  Output                0 ml  Net              720 ml   Filed Weights   04/27/16 1158  Weight: 57.6 kg (127 lb)    Examination:  General exam: Appears calm and comfortable, In NAD  Respiratory system: Clear to auscultation. Respiratory effort normal. Cardiovascular system: S1 & S2 heard, regular Gastrointestinal system: Abdomen is nondistended, soft and nontender. No organomegaly or masses felt. Normal bowel sounds heard. Central nervous system: Alert and oriented. No focal neurological deficits. Extremities: Symmetric 5 x 5 power. Skin: No rashes, lesions Psychiatry: Judgement and insight appear normal. Mood & affect appropriate, no auditory/visual hallucinations   Data Reviewed: I have personally reviewed following labs and imaging studies  CBC:  Recent Labs Lab 04/27/16 1317 04/28/16 0713 04/29/16 0607 04/30/16 0709  WBC 4.6 7.1 6.7 6.8  NEUTROABS 1.9  --   --   --   HGB 10.7* 9.1* 8.5* 7.5*  HCT 31.9* 27.5* 26.1* 23.5*  MCV 124.6* 127.9* 132.5* 132.8*  PLT 34* 26* 27* 27*   Basic Metabolic Panel:  Recent Labs Lab 04/27/16 1317 04/27/16 1704 04/28/16 0713 04/29/16 0607  NA 130*  --  136 136  K 3.4*  --  3.7 3.5  CL 101  --  108 106  CO2 19*  --  22 23  GLUCOSE 83  --  90 87  BUN 10  --  10 10  CREATININE 0.85  --  0.79 0.86  CALCIUM 8.0*  --  7.9* 7.7*  MG  --  1.4*  --   --   PHOS  --  2.6  --   --    GFR: Estimated Creatinine Clearance: 64.8 mL/min (by C-G formula based on SCr of 0.86 mg/dL). Liver Function Tests:  Recent Labs Lab 04/27/16 1538 04/28/16 0713 04/29/16 0607  AST 117* 71* 71*  ALT 21 17 17   ALKPHOS 120 112 104  BILITOT 2.3* 2.2* 1.9*  PROT 7.6 7.5 6.7  ALBUMIN 2.4* 2.4* 2.2*   No  results for input(s): LIPASE, AMYLASE in the last 168 hours. No results for input(s): AMMONIA in the last 168 hours. Coagulation Profile:  Recent Labs Lab 04/27/16 1704  INR 1.20   Cardiac Enzymes:  Recent Labs Lab 04/27/16 1704  TROPONINI <0.03   BNP (last 3 results) No results for input(s): PROBNP in the last 8760 hours. HbA1C: No results for input(s): HGBA1C in the last 72 hours. CBG: No results for input(s): GLUCAP in the last 168 hours. Lipid Profile: No results for input(s): CHOL, HDL, LDLCALC, TRIG, CHOLHDL, LDLDIRECT in the last 72 hours. Thyroid Function Tests: No results for input(s): TSH, T4TOTAL, FREET4, T3FREE, THYROIDAB in the last 72 hours. Anemia Panel: No results for input(s):  VITAMINB12, FOLATE, FERRITIN, TIBC, IRON, RETICCTPCT in the last 72 hours. Sepsis Labs:  Recent Labs Lab 04/27/16 1538 04/27/16 1704 04/27/16 2123 04/30/16 0709  PROCALCITON  --  5.68  --   --   LATICACIDVEN 3.6* 3.0* 2.3* 1.6    Recent Results (from the past 240 hour(s))  Urine culture     Status: Abnormal   Collection Time: 04/27/16 12:12 PM  Result Value Ref Range Status   Specimen Description URINE, RANDOM  Final   Special Requests NONE  Final   Culture (A)  Final    >=100,000 COLONIES/mL ESCHERICHIA COLI >=100,000 COLONIES/mL KLEBSIELLA PNEUMONIAE    Report Status 04/30/2016 FINAL  Final   Organism ID, Bacteria ESCHERICHIA COLI (A)  Final   Organism ID, Bacteria KLEBSIELLA PNEUMONIAE (A)  Final      Susceptibility   Escherichia coli - MIC*    AMPICILLIN <=2 SENSITIVE Sensitive     CEFAZOLIN <=4 SENSITIVE Sensitive     CEFTRIAXONE <=1 SENSITIVE Sensitive     CIPROFLOXACIN <=0.25 SENSITIVE Sensitive     GENTAMICIN <=1 SENSITIVE Sensitive     IMIPENEM <=0.25 SENSITIVE Sensitive     NITROFURANTOIN <=16 SENSITIVE Sensitive     TRIMETH/SULFA <=20 SENSITIVE Sensitive     AMPICILLIN/SULBACTAM <=2 SENSITIVE Sensitive     PIP/TAZO <=4 SENSITIVE Sensitive     Extended  ESBL NEGATIVE Sensitive     * >=100,000 COLONIES/mL ESCHERICHIA COLI   Klebsiella pneumoniae - MIC*    AMPICILLIN 16 RESISTANT Resistant     CEFAZOLIN <=4 SENSITIVE Sensitive     CEFTRIAXONE <=1 SENSITIVE Sensitive     CIPROFLOXACIN <=0.25 SENSITIVE Sensitive     GENTAMICIN <=1 SENSITIVE Sensitive     IMIPENEM <=0.25 SENSITIVE Sensitive     NITROFURANTOIN <=16 SENSITIVE Sensitive     TRIMETH/SULFA <=20 SENSITIVE Sensitive     AMPICILLIN/SULBACTAM 4 SENSITIVE Sensitive     PIP/TAZO <=4 SENSITIVE Sensitive     Extended ESBL NEGATIVE Sensitive     * >=100,000 COLONIES/mL KLEBSIELLA PNEUMONIAE  Wet prep, genital     Status: None   Collection Time: 04/27/16  2:45 PM  Result Value Ref Range Status   Yeast Wet Prep HPF POC NONE SEEN NONE SEEN Final   Trich, Wet Prep NONE SEEN NONE SEEN Final   Clue Cells Wet Prep HPF POC NONE SEEN NONE SEEN Final   WBC, Wet Prep HPF POC NONE SEEN NONE SEEN Final   Sperm NONE SEEN  Final  Blood Culture (routine x 2)     Status: None (Preliminary result)   Collection Time: 04/27/16  5:03 PM  Result Value Ref Range Status   Specimen Description BLOOD LEFT ANTECUBITAL  Final   Special Requests   Final    BOTTLES DRAWN AEROBIC AND ANAEROBIC AEB=10CC ANA=5CC   Culture NO GROWTH < 24 HOURS  Final   Report Status PENDING  Incomplete  Blood Culture (routine x 2)     Status: None (Preliminary result)   Collection Time: 04/27/16  5:04 PM  Result Value Ref Range Status   Specimen Description BLOOD RIGHT HAND  Final   Special Requests BOTTLES DRAWN AEROBIC AND ANAEROBIC 5CC EACH  Final   Culture NO GROWTH < 24 HOURS  Final   Report Status PENDING  Incomplete     Radiology Studies: No results found.  Scheduled Meds: . sodium chloride   Intravenous Once  . cefTRIAXone (ROCEPHIN)  IV  1 g Intravenous Q24H  . feeding supplement (ENSURE ENLIVE)  237 mL Oral BID BM  . folic acid  1 mg Oral Daily  . multivitamin with minerals  1 tablet Oral Daily  . nicotine   21 mg Transdermal Daily  . polyethylene glycol  17 g Oral Daily  . sodium chloride flush  3 mL Intravenous Q12H  . thiamine  100 mg Oral Daily   Or  . thiamine  100 mg Intravenous Daily   Continuous Infusions:     LOS: 3 days   Rashon Rezek, Scheryl MartenSTEPHEN K, MD [facility] Pager [phone]  If 7PM-7AM, please contact night-coverage [email or web address] Password [email or web address] 04/30/2016, 3:32 PM

## 2016-04-30 NOTE — Progress Notes (Signed)
Pt requesting something to help her sleep tonight.  [name]. [place] notified.

## 2016-05-01 LAB — TYPE AND SCREEN
ABO/RH(D): AB NEG
Antibody Screen: NEGATIVE
UNIT DIVISION: 0
UNIT DIVISION: 0

## 2016-05-01 LAB — CBC
HCT: 33.6 % — ABNORMAL LOW (ref 36.0–46.0)
HEMOGLOBIN: 11.2 g/dL — AB (ref 12.0–15.0)
MCH: 37.3 pg — AB (ref 26.0–34.0)
MCHC: 33.3 g/dL (ref 30.0–36.0)
MCV: 112 fL — AB (ref 78.0–100.0)
Platelets: 29 10*3/uL — CL (ref 150–400)
RBC: 3 MIL/uL — ABNORMAL LOW (ref 3.87–5.11)
RDW: 31 % — ABNORMAL HIGH (ref 11.5–15.5)
WBC: 7.5 10*3/uL (ref 4.0–10.5)

## 2016-05-01 LAB — COMPREHENSIVE METABOLIC PANEL
ALBUMIN: 2.1 g/dL — AB (ref 3.5–5.0)
ALK PHOS: 98 U/L (ref 38–126)
ALT: 24 U/L (ref 14–54)
ANION GAP: 4 — AB (ref 5–15)
AST: 95 U/L — ABNORMAL HIGH (ref 15–41)
BUN: <5 mg/dL — ABNORMAL LOW (ref 6–20)
CALCIUM: 8 mg/dL — AB (ref 8.9–10.3)
CHLORIDE: 105 mmol/L (ref 101–111)
CO2: 24 mmol/L (ref 22–32)
CREATININE: 0.74 mg/dL (ref 0.44–1.00)
GFR calc Af Amer: >60 mL/min (ref >60)
GFR calc non Af Amer: >60 mL/min (ref >60)
GLUCOSE: 97 mg/dL (ref 65–99)
Potassium: 3.2 mmol/L — ABNORMAL LOW (ref 3.5–5.1)
SODIUM: 133 mmol/L — AB (ref 135–145)
Total Bilirubin: 3.9 mg/dL — ABNORMAL HIGH (ref 0.3–1.2)
Total Protein: 6.6 g/dL (ref 6.5–8.1)

## 2016-05-01 LAB — MAGNESIUM: MAGNESIUM: 1.2 mg/dL — AB (ref 1.7–2.4)

## 2016-05-01 LAB — HEMOGLOBIN AND HEMATOCRIT, BLOOD
HCT: 33.8 % — ABNORMAL LOW (ref 36.0–46.0)
Hemoglobin: 11.3 g/dL — ABNORMAL LOW (ref 12.0–15.0)

## 2016-05-01 MED ORDER — POTASSIUM CHLORIDE CRYS ER 20 MEQ PO TBCR
40.0000 meq | EXTENDED_RELEASE_TABLET | Freq: Two times a day (BID) | ORAL | Status: DC
  Administered 2016-05-01: 40 meq via ORAL
  Filled 2016-05-01: qty 2

## 2016-05-01 MED ORDER — MAGNESIUM SULFATE 4 GM/100ML IV SOLN
4.0000 g | Freq: Once | INTRAVENOUS | Status: AC
  Administered 2016-05-01: 4 g via INTRAVENOUS
  Filled 2016-05-01: qty 100

## 2016-05-01 MED ORDER — CIPROFLOXACIN HCL 500 MG PO TABS: 500.0000 mg | ORAL_TABLET | Freq: Two times a day (BID) | ORAL | 0 refills | Status: DC

## 2016-05-01 NOTE — Discharge Instructions (Signed)
Please follow up with Dr. Jena Gaussourk as soon as possible to discuss your liver disease

## 2016-05-01 NOTE — Discharge Summary (Signed)
Physician Discharge Summary  [name] ZOX:[id number] DOB: 1956-04-17 DOA: 04/27/2016  PCP: Vertis Kelch, NP  Admit date: 04/27/2016 Discharge date: 05/01/2016  Admitted From: home Disposition:  home  Recommendations for Outpatient Follow-up:  1. Follow up with PCP in 1-2 weeks 2. Patient advised to follow-up with Dr. Jena Gauss at earliest available appointment 3. Recommend repeat comprehensive metabolic panel and CBC in 1 week   Discharge Condition: Improved CODE STATUS: Full code Diet recommendation: Heart healthy   Brief/Interim Summary: 60 y.o.femalewith medical history significant of EtOH abuse, pancytopenia, cirrhosis, anemia, cholelithiasis, GI bleed, compression fractures, GERD, HTN, CVA with residual left-sided weakness, presenting for complaints of bilateral lower back pain, vaginal pain and dysuria. Symptoms initially intermittent but now constant and getting worse. Started 2 weeks ago. Intermittent hematuria. Patient states she is sexually active and denies any vaginal discharge states vaginal discomfort with urination. Denies fevers, chills, nausea, vomiting, diarrhea, rash, neck stiffness, headache, LOC, vertigo. Patient states that this back pain is different from her chronic ongoing lumbar back pain from compression fractures. Denies any numbness or tingling down the lower extremity's. Over-the-counter yeast cream without improvement in vaginal discomfort.  Likely Ecoli and klebsiella UTI with Sepsis present on admission: Likely urologic etiology. Tachycardia, tachypnea >25, lactic acid 3.6, CT showing increased bilateral thickness consistent with cystitis with adenopathy. PT also cirrhotic and unable to mount proper immune response. Pt has multiple admissions in past for similar presentation.  -Patient was continued on empiric Rocephin, micro-sensitivities confirm both organisms are sensitive to Rocephin.. -Patient clinically improved and later remained afebrile -Patient's  antibiotic changed to by mouth ciprofloxacin on discharge based on culture sensitivities taken  ETOH abuse: continues to drink. ETOH 63 on admission - Continue CIWA -Stable at present  Alcoholic cirrhosis. MELD score 19. Pt followed by Dr. Jena Gauss, but has been a no show at several appointments. Pt scheduled for EGD but did not make it to appt. -Avoid hepatotoxic agents -Patient reportedly continues to drink ETOH -Educated patient and family regarding her ETOH liver disease and of her MELD score. -Still uncertain whether patient will be compliant with her future follow-up visits.  Vaginal pain: likely from urethral irritation. Wet prep neg.  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 Patient was given 2 units prbc's with hemoglobin appropriately correcting  Hyponatremia: - Continue NS IVF  Chronic pain: - continue T#3  Tobacco: - continue nicotine patch while admitted  Discharge Diagnoses:  Principal Problem:   Sepsis secondary to UTI [facility]) Active Problems:   Hyponatremia   Macrocytic anemia   Alcoholic cirrhosis (HCC)   ETOH abuse   UTI (lower urinary tract infection)   Sepsis (HCC)   Tobacco use    Discharge Instructions     Medication List    TAKE these medications   acetaminophen-codeine 300-30 MG tablet Commonly known as:  TYLENOL #3 Take 1 tablet by mouth every 6 (six) hours as needed for moderate pain.   albuterol 108 (90 Base) MCG/ACT inhaler Commonly known as:  PROVENTIL HFA;VENTOLIN HFA Inhale 1-2 puffs into the lungs every 6 (six) hours as needed for wheezing.   ciprofloxacin 500 MG tablet Commonly known as:  CIPRO Take 1 tablet (500 mg total) by mouth 2 (two) times daily.   feeding supplement  (ENSURE  ENLIVE) Liqd Take 237 mLs by mouth 2 (two) times daily between meals.      Follow-up Information    Vertis Kelch, NP. Schedule an appointment as soon as possible for a visit in 1 week(s).   Specialty:  Nurse Practitioner       Eula Listen, MD. Schedule an appointment as soon as possible for a visit in 1 week(s).   Specialty:  Gastroenterology Contact information: [address] [place] [address] [phone]          Allergies  Allergen Reactions  . Loratadine Rash    Procedures/Studies: Ct Renal Stone Study  Result Date: 04/27/2016 CLINICAL DATA:  Dysuria, urethral pain, history kidney stones, hypertension, alcoholic cirrhosis, GERD, appendectomy, GI bleeding EXAM: CT ABDOMEN AND PELVIS WITHOUT CONTRAST TECHNIQUE: Multidetector CT imaging of the abdomen and pelvis was performed following the standard protocol without IV contrast. Sagittal and coronal MPR images reconstructed from axial data set. COMPARISON:  05/25/2015 FINDINGS: Lower chest:  Minimal bibasilar atelectasis Hepatobiliary: Cirrhotic liver with few tiny scattered low-attenuation foci, nonspecific, grossly unchanged. Numerous calculi within gallbladder. No discrete hepatic mass. Pancreas: Normal appearance Spleen: Normal appearance Adrenals/Urinary Tract: Slight thickening of LEFT adrenal gland without discrete adrenal mass. Cyst anterior LEFT kidney inferior pole 3.0 x 2.3 cm grossly unchanged. Kidneys otherwise normal appearance. Additional mass, hydronephrosis, or urinary tract calcification. Mild scattered bladder wall thickening with mild infiltration of perivesicular fat question cystitis. Stomach/Bowel: Small fat attenuation focus within duodenum 7 x 6 x 14 mm question tiny duodenal lipoma. Sigmoid diverticulosis. Stomach and bowel loops otherwise normal appearance. Appendix surgically absent by history. Vascular/Lymphatic: Atherosclerotic calcifications aorta, iliac arteries, coronary arteries.  Numerous pelvic phleboliths. No enlarged abdominal or pelvic nodes. Reproductive: Calcified leiomyoma within uterus. Uterus and adnexa otherwise unremarkable. Other: Mild hazy infiltrative changes identified adjacent to small normal size lymph nodes at celiac axis question minimal adenitis or inflammatory process. No abscess collection, free air or free fluid. No hernia. Musculoskeletal: Marked compression deformity of L1 vertebral body with vertebra plana configuration focal kyphosis, progressive since 05/25/2015. Bones appear demineralized. IMPRESSION: Mild bladder wall thickening with infiltration of perivesicular fat raising question of cystitis. Sigmoid diverticulosis without evidence of diverticulitis. Marked compression fracture of L1 vertebral body with vertebra plana configuration associated with focal kyphosis, progressive since 2016. Minimal hazy infiltrative changes adjacent to small normal sized lymph nodes at the celiac access raising question of adenitis or nonspecific inflammatory process, of uncertain relationship to cystitis identified in pelvis. Electronically Signed   By: Ulyses Southward M.D.   On: 04/27/2016 15:22     Subjective: Patient very eager to go home today  Discharge Exam: Vitals:   04/30/16 2338 05/01/16 0500  BP: (!) 144/68 (!) 149/81  Pulse: 100 (!) 110  Resp:    Temp: 99.2 F (37.3 C) 100 F (37.8 C)   Vitals:   04/30/16 2115 04/30/16 2200 04/30/16 2338 05/01/16 0500  BP: 110/71 108/71 (!) 144/68 (!) 149/81  Pulse: (!) 103 91 100 (!) 110  Resp: 20 18    Temp: 99.4 F (37.4 C) 99.5 F (37.5 C) 99.2 F (37.3 C) 100 F (37.8 C)  TempSrc: Oral Oral Oral Oral  SpO2: 99% 99% 100% 100%  Weight:      Height:        General: Pt is alert, awake, not in acute distress Cardiovascular: RRR, S1/S2 +, no rubs, no gallops Respiratory: CTA bilaterally, no wheezing, no rhonchi Abdominal: Soft, NT, ND, bowel sounds + Extremities: no edema,  no cyanosis   The results  of significant diagnostics from this hospitalization (including imaging, microbiology, ancillary and laboratory) are listed below for reference.     Microbiology: Recent Results (from the past 240 hour(s))  Urine culture     Status: Abnormal   Collection Time: 04/27/16 12:12 PM  Result Value Ref Range Status   Specimen Description URINE, RANDOM  Final   Special Requests NONE  Final   Culture (A)  Final    >=100,000 COLONIES/mL ESCHERICHIA COLI >=100,000 COLONIES/mL KLEBSIELLA PNEUMONIAE    Report Status 04/30/2016 FINAL  Final   Organism ID, Bacteria ESCHERICHIA COLI (A)  Final   Organism ID, Bacteria KLEBSIELLA PNEUMONIAE (A)  Final      Susceptibility   Escherichia coli - MIC*    AMPICILLIN <=2 SENSITIVE Sensitive     CEFAZOLIN <=4 SENSITIVE Sensitive     CEFTRIAXONE <=1 SENSITIVE Sensitive     CIPROFLOXACIN <=0.25 SENSITIVE Sensitive     GENTAMICIN <=1 SENSITIVE Sensitive     IMIPENEM <=0.25 SENSITIVE Sensitive     NITROFURANTOIN <=16 SENSITIVE Sensitive     TRIMETH/SULFA <=20 SENSITIVE Sensitive     AMPICILLIN/SULBACTAM <=2 SENSITIVE Sensitive     PIP/TAZO <=4 SENSITIVE Sensitive     Extended ESBL NEGATIVE Sensitive     * >=100,000 COLONIES/mL ESCHERICHIA COLI   Klebsiella pneumoniae - MIC*    AMPICILLIN 16 RESISTANT Resistant     CEFAZOLIN <=4 SENSITIVE Sensitive     CEFTRIAXONE <=1 SENSITIVE Sensitive     CIPROFLOXACIN <=0.25 SENSITIVE Sensitive     GENTAMICIN <=1 SENSITIVE Sensitive     IMIPENEM <=0.25 SENSITIVE Sensitive     NITROFURANTOIN <=16 SENSITIVE Sensitive     TRIMETH/SULFA <=20 SENSITIVE Sensitive     AMPICILLIN/SULBACTAM 4 SENSITIVE Sensitive     PIP/TAZO <=4 SENSITIVE Sensitive     Extended ESBL NEGATIVE Sensitive     * >=100,000 COLONIES/mL KLEBSIELLA PNEUMONIAE  Wet prep, genital     Status: None   Collection Time: 04/27/16  2:45 PM  Result Value Ref Range Status   Yeast Wet Prep HPF POC NONE SEEN NONE SEEN Final   Trich, Wet Prep NONE SEEN NONE  SEEN Final   Clue Cells Wet Prep HPF POC NONE SEEN NONE SEEN Final   WBC, Wet Prep HPF POC NONE SEEN NONE SEEN Final   Sperm NONE SEEN  Final  Blood Culture (routine x 2)     Status: None (Preliminary result)   Collection Time: 04/27/16  5:03 PM  Result Value Ref Range Status   Specimen Description BLOOD LEFT ANTECUBITAL  Final   Special Requests   Final    BOTTLES DRAWN AEROBIC AND ANAEROBIC AEB=10CC ANA=5CC   Culture NO GROWTH 4 DAYS  Final   Report Status PENDING  Incomplete  Blood Culture (routine x 2)     Status: None (Preliminary result)   Collection Time: 04/27/16  5:04 PM  Result Value Ref Range Status   Specimen Description BLOOD RIGHT HAND  Final   Special Requests BOTTLES DRAWN AEROBIC AND ANAEROBIC 5CC EACH  Final   Culture NO GROWTH 4 DAYS  Final   Report Status PENDING  Incomplete     Labs: BNP (last 3 results) No results for input(s): BNP in the last 8760 hours. Basic Metabolic Panel:  Recent Labs Lab 04/27/16 1317 04/27/16 1704 04/28/16 0713 04/29/16 0607 05/01/16 0638  NA 130*  --  136 136 133*  K 3.4*  --  3.7 3.5 3.2*  CL  101  --  108 106 105  CO2 19*  --  22 23 24   GLUCOSE 83  --  90 87 97  BUN 10  --  10 10 <5*  CREATININE 0.85  --  0.79 0.86 0.74  CALCIUM 8.0*  --  7.9* 7.7* 8.0*  MG  --  1.4*  --   --  1.2*  PHOS  --  2.6  --   --   --    Liver Function Tests:  Recent Labs Lab 04/27/16 1538 04/28/16 0713 04/29/16 0607 05/01/16 0638  AST 117* 71* 71* 95*  ALT 21 17 17 24   ALKPHOS 120 112 104 98  BILITOT 2.3* 2.2* 1.9* 3.9*  PROT 7.6 7.5 6.7 6.6  ALBUMIN 2.4* 2.4* 2.2* 2.1*   No results for input(s): LIPASE, AMYLASE in the last 168 hours. No results for input(s): AMMONIA in the last 168 hours. CBC:  Recent Labs Lab 04/27/16 1317 04/28/16 0713 04/29/16 0607 04/30/16 0709 05/01/16 0038 05/01/16 0638  WBC 4.6 7.1 6.7 6.8  --  7.5  NEUTROABS 1.9  --   --   --   --   --   HGB 10.7* 9.1* 8.5* 7.5* 11.3* 11.2*  HCT 31.9* 27.5*  26.1* 23.5* 33.8* 33.6*  MCV 124.6* 127.9* 132.5* 132.8*  --  112.0*  PLT 34* 26* 27* 27*  --  29*   Cardiac Enzymes:  Recent Labs Lab 04/27/16 1704  TROPONINI <0.03   BNP: Invalid input(s): POCBNP CBG: No results for input(s): GLUCAP in the last 168 hours. D-Dimer No results for input(s): DDIMER in the last 72 hours. Hgb A1c No results for input(s): HGBA1C in the last 72 hours. Lipid Profile No results for input(s): CHOL, HDL, LDLCALC, TRIG, CHOLHDL, LDLDIRECT in the last 72 hours. Thyroid function studies No results for input(s): TSH, T4TOTAL, T3FREE, THYROIDAB in the last 72 hours.  Invalid input(s): FREET3 Anemia work up No results for input(s): VITAMINB12, FOLATE, FERRITIN, TIBC, IRON, RETICCTPCT in the last 72 hours. Urinalysis    Component Value Date/Time   COLORURINE YELLOW 04/27/2016 1212   APPEARANCEUR CLOUDY (A) 04/27/2016 1212   LABSPEC 1.010 04/27/2016 1212   PHURINE 5.5 04/27/2016 1212   GLUCOSEU NEGATIVE 04/27/2016 1212   HGBUR MODERATE (A) 04/27/2016 1212   BILIRUBINUR NEGATIVE 04/27/2016 1212   KETONESUR NEGATIVE 04/27/2016 1212   PROTEINUR 30 (A) 04/27/2016 1212   UROBILINOGEN 2.0 (H) 06/25/2015 1746   NITRITE NEGATIVE 04/27/2016 1212   LEUKOCYTESUR MODERATE (A) 04/27/2016 1212   Sepsis Labs Invalid input(s): PROCALCITONIN,  WBC,  LACTICIDVEN Microbiology Recent Results (from the past 240 hour(s))  Urine culture     Status: Abnormal   Collection Time: 04/27/16 12:12 PM  Result Value Ref Range Status   Specimen Description URINE, RANDOM  Final   Special Requests NONE  Final   Culture (A)  Final    >=100,000 COLONIES/mL ESCHERICHIA COLI >=100,000 COLONIES/mL KLEBSIELLA PNEUMONIAE    Report Status 04/30/2016 FINAL  Final   Organism ID, Bacteria ESCHERICHIA COLI (A)  Final   Organism ID, Bacteria KLEBSIELLA PNEUMONIAE (A)  Final      Susceptibility   Escherichia coli - MIC*    AMPICILLIN <=2 SENSITIVE Sensitive     CEFAZOLIN <=4 SENSITIVE  Sensitive     CEFTRIAXONE <=1 SENSITIVE Sensitive     CIPROFLOXACIN <=0.25 SENSITIVE Sensitive     GENTAMICIN <=1 SENSITIVE Sensitive     IMIPENEM <=0.25 SENSITIVE Sensitive     NITROFURANTOIN <=16 SENSITIVE Sensitive  TRIMETH/SULFA <=20 SENSITIVE Sensitive     AMPICILLIN/SULBACTAM <=2 SENSITIVE Sensitive     PIP/TAZO <=4 SENSITIVE Sensitive     Extended ESBL NEGATIVE Sensitive     * >=100,000 COLONIES/mL ESCHERICHIA COLI   Klebsiella pneumoniae - MIC*    AMPICILLIN 16 RESISTANT Resistant     CEFAZOLIN <=4 SENSITIVE Sensitive     CEFTRIAXONE <=1 SENSITIVE Sensitive     CIPROFLOXACIN <=0.25 SENSITIVE Sensitive     GENTAMICIN <=1 SENSITIVE Sensitive     IMIPENEM <=0.25 SENSITIVE Sensitive     NITROFURANTOIN <=16 SENSITIVE Sensitive     TRIMETH/SULFA <=20 SENSITIVE Sensitive     AMPICILLIN/SULBACTAM 4 SENSITIVE Sensitive     PIP/TAZO <=4 SENSITIVE Sensitive     Extended ESBL NEGATIVE Sensitive     * >=100,000 COLONIES/mL KLEBSIELLA PNEUMONIAE  Wet prep, genital     Status: None   Collection Time: 04/27/16  2:45 PM  Result Value Ref Range Status   Yeast Wet Prep HPF POC NONE SEEN NONE SEEN Final   Trich, Wet Prep NONE SEEN NONE SEEN Final   Clue Cells Wet Prep HPF POC NONE SEEN NONE SEEN Final   WBC, Wet Prep HPF POC NONE SEEN NONE SEEN Final   Sperm NONE SEEN  Final  Blood Culture (routine x 2)     Status: None (Preliminary result)   Collection Time: 04/27/16  5:03 PM  Result Value Ref Range Status   Specimen Description BLOOD LEFT ANTECUBITAL  Final   Special Requests   Final    BOTTLES DRAWN AEROBIC AND ANAEROBIC AEB=10CC ANA=5CC   Culture NO GROWTH 4 DAYS  Final   Report Status PENDING  Incomplete  Blood Culture (routine x 2)     Status: None (Preliminary result)   Collection Time: 04/27/16  5:04 PM  Result Value Ref Range Status   Specimen Description BLOOD RIGHT HAND  Final   Special Requests BOTTLES DRAWN AEROBIC AND ANAEROBIC 5CC EACH  Final   Culture NO GROWTH 4  DAYS  Final   Report Status PENDING  Incomplete     SIGNED:   Mekaila Tarnow, Scheryl MartenSTEPHEN K, MD  [facility] 05/01/2016, 3:02 PM  If 7PM-7AM, please contact night-coverage [email or web address] Password [email or web address]

## 2016-05-01 NOTE — Progress Notes (Signed)
Patient's IV removed.  Site WNL.  AVS reviewed with patient and patient's son.  Verbalized understanding of discharge instructions, physician follow-up, medications.  Prescription for antibiotic given to patient.  Patient transported by NT via wheelchair to main entrance for discharge.  Patient stable at time of discharge.

## 2016-05-02 LAB — CULTURE, BLOOD (ROUTINE X 2)
CULTURE: NO GROWTH
Culture: NO GROWTH

## 2016-05-03 ENCOUNTER — Ambulatory Visit: Payer: Medicaid Other | Admitting: Gastroenterology

## 2016-05-03 ENCOUNTER — Encounter: Payer: Self-pay | Admitting: Gastroenterology

## 2016-05-03 ENCOUNTER — Telehealth: Payer: Self-pay | Admitting: Gastroenterology

## 2016-05-03 NOTE — Telephone Encounter (Signed)
PT WAS A NO SHOW AND LETTER SENT  °

## 2016-08-21 ENCOUNTER — Emergency Department (HOSPITAL_COMMUNITY): Payer: Medicaid Other

## 2016-08-21 ENCOUNTER — Encounter (HOSPITAL_COMMUNITY): Payer: Self-pay

## 2016-08-21 ENCOUNTER — Inpatient Hospital Stay (HOSPITAL_COMMUNITY)
Admission: EM | Admit: 2016-08-21 | Discharge: 2016-08-31 | DRG: 853 | Disposition: A | Discharge status: Skilled Nursing Facility | Payer: Medicaid Other | Attending: Internal Medicine | Admitting: Internal Medicine

## 2016-08-21 DIAGNOSIS — K7010 Alcoholic hepatitis without ascites: Secondary | ICD-10-CM | POA: Diagnosis present

## 2016-08-21 DIAGNOSIS — S7222XA Displaced subtrochanteric fracture of left femur, initial encounter for closed fracture: Secondary | ICD-10-CM

## 2016-08-21 DIAGNOSIS — E860 Dehydration: Secondary | ICD-10-CM | POA: Diagnosis present

## 2016-08-21 DIAGNOSIS — A419 Sepsis, unspecified organism: Principal | ICD-10-CM | POA: Diagnosis present

## 2016-08-21 DIAGNOSIS — R339 Retention of urine, unspecified: Secondary | ICD-10-CM | POA: Diagnosis present

## 2016-08-21 DIAGNOSIS — F1027 Alcohol dependence with alcohol-induced persisting dementia: Secondary | ICD-10-CM | POA: Diagnosis present

## 2016-08-21 DIAGNOSIS — I959 Hypotension, unspecified: Secondary | ICD-10-CM | POA: Diagnosis not present

## 2016-08-21 DIAGNOSIS — K921 Melena: Secondary | ICD-10-CM | POA: Diagnosis not present

## 2016-08-21 DIAGNOSIS — D696 Thrombocytopenia, unspecified: Secondary | ICD-10-CM | POA: Diagnosis present

## 2016-08-21 DIAGNOSIS — J9601 Acute respiratory failure with hypoxia: Secondary | ICD-10-CM | POA: Diagnosis present

## 2016-08-21 DIAGNOSIS — K922 Gastrointestinal hemorrhage, unspecified: Secondary | ICD-10-CM | POA: Diagnosis present

## 2016-08-21 DIAGNOSIS — D684 Acquired coagulation factor deficiency: Secondary | ICD-10-CM | POA: Diagnosis present

## 2016-08-21 DIAGNOSIS — K920 Hematemesis: Secondary | ICD-10-CM | POA: Diagnosis present

## 2016-08-21 DIAGNOSIS — N179 Acute kidney failure, unspecified: Secondary | ICD-10-CM

## 2016-08-21 DIAGNOSIS — K219 Gastro-esophageal reflux disease without esophagitis: Secondary | ICD-10-CM | POA: Diagnosis not present

## 2016-08-21 DIAGNOSIS — K7211 Chronic hepatic failure with coma: Secondary | ICD-10-CM

## 2016-08-21 DIAGNOSIS — E872 Acidosis: Secondary | ICD-10-CM | POA: Diagnosis present

## 2016-08-21 DIAGNOSIS — K7210 Chronic hepatic failure without coma: Secondary | ICD-10-CM | POA: Diagnosis present

## 2016-08-21 DIAGNOSIS — K7030 Alcoholic cirrhosis of liver without ascites: Secondary | ICD-10-CM | POA: Diagnosis not present

## 2016-08-21 DIAGNOSIS — I248 Other forms of acute ischemic heart disease: Secondary | ICD-10-CM | POA: Diagnosis not present

## 2016-08-21 DIAGNOSIS — W19XXXA Unspecified fall, initial encounter: Secondary | ICD-10-CM

## 2016-08-21 DIAGNOSIS — K766 Portal hypertension: Secondary | ICD-10-CM | POA: Diagnosis present

## 2016-08-21 DIAGNOSIS — Z8673 Personal history of transient ischemic attack (TIA), and cerebral infarction without residual deficits: Secondary | ICD-10-CM | POA: Diagnosis not present

## 2016-08-21 DIAGNOSIS — J690 Pneumonitis due to inhalation of food and vomit: Secondary | ICD-10-CM | POA: Diagnosis present

## 2016-08-21 DIAGNOSIS — D62 Acute posthemorrhagic anemia: Secondary | ICD-10-CM | POA: Diagnosis present

## 2016-08-21 DIAGNOSIS — R579 Shock, unspecified: Secondary | ICD-10-CM

## 2016-08-21 DIAGNOSIS — W1830XA Fall on same level, unspecified, initial encounter: Secondary | ICD-10-CM | POA: Diagnosis not present

## 2016-08-21 DIAGNOSIS — I10 Essential (primary) hypertension: Secondary | ICD-10-CM | POA: Diagnosis present

## 2016-08-21 DIAGNOSIS — K222 Esophageal obstruction: Secondary | ICD-10-CM | POA: Diagnosis present

## 2016-08-21 DIAGNOSIS — K3189 Other diseases of stomach and duodenum: Secondary | ICD-10-CM | POA: Diagnosis present

## 2016-08-21 DIAGNOSIS — D539 Nutritional anemia, unspecified: Secondary | ICD-10-CM | POA: Diagnosis present

## 2016-08-21 DIAGNOSIS — F17210 Nicotine dependence, cigarettes, uncomplicated: Secondary | ICD-10-CM | POA: Diagnosis present

## 2016-08-21 DIAGNOSIS — S72142A Displaced intertrochanteric fracture of left femur, initial encounter for closed fracture: Secondary | ICD-10-CM | POA: Diagnosis present

## 2016-08-21 DIAGNOSIS — S72009A Fracture of unspecified part of neck of unspecified femur, initial encounter for closed fracture: Secondary | ICD-10-CM

## 2016-08-21 LAB — URINALYSIS, ROUTINE W REFLEX MICROSCOPIC
Glucose, UA: 100 mg/dL — AB
HGB URINE DIPSTICK: NEGATIVE
Ketones, ur: NEGATIVE mg/dL
Nitrite: POSITIVE — AB
PROTEIN: 30 mg/dL — AB
SPECIFIC GRAVITY, URINE: 1.025 (ref 1.005–1.030)
pH: 5.5 (ref 5.0–8.0)

## 2016-08-21 LAB — GLUCOSE, CAPILLARY: Glucose-Capillary: 141 mg/dL — ABNORMAL HIGH (ref 65–99)

## 2016-08-21 LAB — URINALYSIS, MICROSCOPIC (REFLEX)

## 2016-08-21 LAB — COMPREHENSIVE METABOLIC PANEL
ALK PHOS: 92 U/L (ref 38–126)
ALT: 54 U/L (ref 14–54)
AST: 221 U/L — ABNORMAL HIGH (ref 15–41)
Albumin: 2.1 g/dL — ABNORMAL LOW (ref 3.5–5.0)
Anion gap: 31 — ABNORMAL HIGH (ref 5–15)
BILIRUBIN TOTAL: 8.1 mg/dL — AB (ref 0.3–1.2)
BUN: 15 mg/dL (ref 6–20)
CALCIUM: 8 mg/dL — AB (ref 8.9–10.3)
CHLORIDE: 90 mmol/L — AB (ref 101–111)
CO2: 14 mmol/L — ABNORMAL LOW (ref 22–32)
CREATININE: 2 mg/dL — AB (ref 0.44–1.00)
GFR, EST AFRICAN AMERICAN: 31 mL/min — AB (ref >60)
GFR, EST NON AFRICAN AMERICAN: 26 mL/min — AB (ref >60)
Glucose, Bld: 140 mg/dL — ABNORMAL HIGH (ref 65–99)
Potassium: 3 mmol/L — ABNORMAL LOW (ref 3.5–5.1)
Sodium: 135 mmol/L (ref 135–145)
TOTAL PROTEIN: 6.7 g/dL (ref 6.5–8.1)

## 2016-08-21 LAB — CK: CK TOTAL: 425 U/L — AB (ref 38–234)

## 2016-08-21 LAB — I-STAT CHEM 8, ED
BUN: 14 mg/dL (ref 6–20)
CREATININE: 2.2 mg/dL — AB (ref 0.44–1.00)
Calcium, Ion: 0.81 mmol/L — CL (ref 1.15–1.40)
Chloride: 90 mmol/L — ABNORMAL LOW (ref 101–111)
GLUCOSE: 147 mg/dL — AB (ref 65–99)
HCT: 22 % — ABNORMAL LOW (ref 36.0–46.0)
Hemoglobin: 7.5 g/dL — ABNORMAL LOW (ref 12.0–15.0)
Potassium: 3.3 mmol/L — ABNORMAL LOW (ref 3.5–5.1)
Sodium: 135 mmol/L (ref 135–145)
TCO2: 16 mmol/L (ref 0–100)

## 2016-08-21 LAB — BASIC METABOLIC PANEL
ANION GAP: 15 (ref 5–15)
BUN: 14 mg/dL (ref 6–20)
CO2: 21 mmol/L — ABNORMAL LOW (ref 22–32)
Calcium: 7.1 mg/dL — ABNORMAL LOW (ref 8.9–10.3)
Chloride: 99 mmol/L — ABNORMAL LOW (ref 101–111)
Creatinine, Ser: 1.93 mg/dL — ABNORMAL HIGH (ref 0.44–1.00)
GFR, EST AFRICAN AMERICAN: 32 mL/min — AB (ref >60)
GFR, EST NON AFRICAN AMERICAN: 27 mL/min — AB (ref >60)
Glucose, Bld: 119 mg/dL — ABNORMAL HIGH (ref 65–99)
Potassium: 3 mmol/L — ABNORMAL LOW (ref 3.5–5.1)
SODIUM: 135 mmol/L (ref 135–145)

## 2016-08-21 LAB — CBC WITH DIFFERENTIAL/PLATELET
BASOS PCT: 0 %
Basophils Absolute: 0 10*3/uL (ref 0.0–0.1)
EOS PCT: 0 %
Eosinophils Absolute: 0 10*3/uL (ref 0.0–0.7)
HCT: 21.1 % — ABNORMAL LOW (ref 36.0–46.0)
Hemoglobin: 6.3 g/dL — CL (ref 12.0–15.0)
Lymphocytes Relative: 8 %
Lymphs Abs: 1.3 10*3/uL (ref 0.7–4.0)
MCH: 39.9 pg — AB (ref 26.0–34.0)
MCHC: 29.9 g/dL — ABNORMAL LOW (ref 30.0–36.0)
MCV: 133.5 fL — AB (ref 78.0–100.0)
MONO ABS: 1.3 10*3/uL — AB (ref 0.1–1.0)
Monocytes Relative: 8 %
NEUTROS PCT: 84 %
Neutro Abs: 13.6 10*3/uL — ABNORMAL HIGH (ref 1.7–7.7)
PLATELETS: 86 10*3/uL — AB (ref 150–400)
RBC: 1.58 MIL/uL — ABNORMAL LOW (ref 3.87–5.11)
RDW: 18.2 % — ABNORMAL HIGH (ref 11.5–15.5)
WBC: 16.2 10*3/uL — ABNORMAL HIGH (ref 4.0–10.5)

## 2016-08-21 LAB — I-STAT TROPONIN, ED: Troponin i, poc: 1.11 ng/mL (ref 0.00–0.08)

## 2016-08-21 LAB — CBG MONITORING, ED
GLUCOSE-CAPILLARY: 132 mg/dL — AB (ref 65–99)
Glucose-Capillary: 129 mg/dL — ABNORMAL HIGH (ref 65–99)

## 2016-08-21 LAB — TROPONIN I
TROPONIN I: 1.6 ng/mL — AB (ref <0.03)
TROPONIN I: 1.66 ng/mL — AB (ref <0.03)

## 2016-08-21 LAB — LACTIC ACID, PLASMA
LACTIC ACID, VENOUS: 18.8 mmol/L — AB (ref 0.5–1.9)
LACTIC ACID, VENOUS: 15 mmol/L — AB (ref 0.5–1.9)
LACTIC ACID, VENOUS: 8.4 mmol/L — AB (ref 0.5–1.9)

## 2016-08-21 LAB — HEMOGLOBIN AND HEMATOCRIT, BLOOD
HCT: 27.1 % — ABNORMAL LOW (ref 36.0–46.0)
HEMOGLOBIN: 8.6 g/dL — AB (ref 12.0–15.0)

## 2016-08-21 LAB — POC OCCULT BLOOD, ED: Fecal Occult Bld: POSITIVE — AB

## 2016-08-21 LAB — PROCALCITONIN: Procalcitonin: 5.27 ng/mL

## 2016-08-21 LAB — PROTIME-INR
INR: 3.24
PROTHROMBIN TIME: 33.8 s — AB (ref 11.4–15.2)

## 2016-08-21 LAB — FIBRINOGEN: FIBRINOGEN: 96 mg/dL — AB (ref 210–475)

## 2016-08-21 LAB — APTT: aPTT: 51 seconds — ABNORMAL HIGH (ref 24–36)

## 2016-08-21 LAB — I-STAT CG4 LACTIC ACID, ED: Lactic Acid, Venous: >17 mmol/L (ref 0.5–1.9)

## 2016-08-21 LAB — PREPARE RBC (CROSSMATCH)

## 2016-08-21 LAB — AMMONIA: Ammonia: 64 umol/L — ABNORMAL HIGH (ref 9–35)

## 2016-08-21 LAB — LIPASE, BLOOD: LIPASE: 56 U/L — AB (ref 11–51)

## 2016-08-21 LAB — ETHANOL

## 2016-08-21 MED ORDER — SODIUM CHLORIDE 0.9 % IV SOLN: 250.0000 mL | INTRAVENOUS | Status: DC | PRN

## 2016-08-21 MED ORDER — SODIUM CHLORIDE 0.9 % IV SOLN
Freq: Once | INTRAVENOUS | Status: AC
  Administered 2016-08-21: 16:00:00 via INTRAVENOUS

## 2016-08-21 MED ORDER — SODIUM CHLORIDE 0.9 % IV SOLN
30.0000 meq | Freq: Once | INTRAVENOUS | Status: AC
  Administered 2016-08-21: 30 meq via INTRAVENOUS
  Filled 2016-08-21: qty 15

## 2016-08-21 MED ORDER — SODIUM CHLORIDE 0.9 % IV SOLN
INTRAVENOUS | Status: DC
  Administered 2016-08-21 – 2016-08-22 (×2): via INTRAVENOUS

## 2016-08-21 MED ORDER — PIPERACILLIN-TAZOBACTAM 3.375 G IVPB 30 MIN
3.3750 g | Freq: Once | INTRAVENOUS | Status: AC
  Administered 2016-08-21: 3.375 g via INTRAVENOUS
  Filled 2016-08-21: qty 50

## 2016-08-21 MED ORDER — VANCOMYCIN HCL IN DEXTROSE 1-5 GM/200ML-% IV SOLN
1000.0000 mg | Freq: Once | INTRAVENOUS | Status: AC
  Administered 2016-08-21: 1000 mg via INTRAVENOUS
  Filled 2016-08-21: qty 200

## 2016-08-21 MED ORDER — SODIUM CHLORIDE 0.9 % IV BOLUS (SEPSIS)
1000.0000 mL | Freq: Once | INTRAVENOUS | Status: AC
  Administered 2016-08-21: 1000 mL via INTRAVENOUS

## 2016-08-21 MED ORDER — SODIUM CHLORIDE 0.9 % IV SOLN
8.0000 mg/h | INTRAVENOUS | Status: DC
  Administered 2016-08-21 – 2016-08-22 (×3): 8 mg/h via INTRAVENOUS
  Filled 2016-08-21 (×6): qty 80

## 2016-08-21 MED ORDER — SODIUM CHLORIDE 0.9 % IV SOLN
50.0000 ug/h | INTRAVENOUS | Status: DC
  Administered 2016-08-21 – 2016-08-22 (×3): 50 ug/h via INTRAVENOUS
  Filled 2016-08-21 (×6): qty 1

## 2016-08-21 MED ORDER — PANTOPRAZOLE SODIUM 40 MG IV SOLR: 40.0000 mg | Freq: Two times a day (BID) | INTRAVENOUS | Status: DC

## 2016-08-21 MED ORDER — SODIUM CHLORIDE 0.9 % IV SOLN
10.0000 mL/h | Freq: Once | INTRAVENOUS | Status: AC
  Administered 2016-08-21: 10 mL/h via INTRAVENOUS

## 2016-08-21 MED ORDER — SODIUM CHLORIDE 0.9 % IV SOLN
1.0000 g | Freq: Once | INTRAVENOUS | Status: AC
  Administered 2016-08-21: 1 g via INTRAVENOUS
  Filled 2016-08-21: qty 10

## 2016-08-21 MED ORDER — FOLIC ACID 5 MG/ML IJ SOLN
1.0000 mg | Freq: Every day | INTRAMUSCULAR | Status: DC
  Administered 2016-08-21 – 2016-08-25 (×5): 1 mg via INTRAVENOUS
  Filled 2016-08-21 (×8): qty 0.2

## 2016-08-21 MED ORDER — SODIUM CHLORIDE 0.9 % IV SOLN
80.0000 mg | Freq: Once | INTRAVENOUS | Status: AC
  Administered 2016-08-21: 13:00:00 80 mg via INTRAVENOUS
  Filled 2016-08-21: qty 80

## 2016-08-21 MED ORDER — NOREPINEPHRINE BITARTRATE 1 MG/ML IV SOLN
0.0000 ug/min | INTRAVENOUS | Status: DC
  Administered 2016-08-21: 5 ug/min via INTRAVENOUS
  Filled 2016-08-21: qty 4

## 2016-08-21 MED ORDER — THIAMINE HCL 100 MG/ML IJ SOLN
100.0000 mg | Freq: Every day | INTRAMUSCULAR | Status: DC
  Administered 2016-08-21 – 2016-08-25 (×5): 100 mg via INTRAVENOUS
  Filled 2016-08-21: qty 2
  Filled 2016-08-21: qty 1
  Filled 2016-08-21: qty 2
  Filled 2016-08-21: qty 1
  Filled 2016-08-21 (×2): qty 2

## 2016-08-21 NOTE — ED Provider Notes (Addendum)
[facility]-EMERGENCY DEPT Provider Note   CSN: [id number] Arrival date & time: 08/21/16  1200  By signing my name below, I, Placido Sou, attest that this documentation has been prepared under the direction and in the presence of Lavera Guise, MD. Electronically Signed: Placido Sou, ED Scribe. 08/21/16. 12:27 PM.   History   Chief Complaint Chief Complaint  Patient presents with  . Altered Mental Status    HPI HPI Comments: [name] is a 60 y.o. female who presents to the Emergency Department by ambulance due to AMS onset this morning. Per EMS, pt fell three days ago and with the help of her son was put back in bed. She has been lying in bed since accident. Pt was found this morning in bed with AMS and covered in feces. She has a deformity to her left hip and obvious yellowing of her skin. Pt reports melanotic stools. Per EMS, her CBG was 38 on scene and was given an amp of D50. She has a h/o alcoholic dementia and cirrhosis. 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 Jena Gauss). She denies n/v, abdominal pain, CP and SOB.  The history is provided by a relative, medical records, the EMS personnel and the patient. No language interpreter was used.   Level V caveat due to AMS.  Past Medical History:  Diagnosis Date  . Alcohol abuse    quit 05/23/15  . Alcoholic cirrhosis (HCC)    05/2015  . Alcoholic dementia (HCC)    05/2015,??? patient reports mental status clear, possible had change related to DTs  . Anemia of chronic disease   . Cholelithiasis   . Chronic GI bleeding   . Coagulopathy (HCC)   . Compression fracture    L1, fall in 05/2015  . Compression fracture of lumbar vertebra (HCC)   . GERD (gastroesophageal reflux disease)   . Hepatic steatosis   . Hypertension   . Normal cardiac stress test 2008   myoview  . Stroke [facility]) 2008   left sided  weakness  . Thrombocytopenia [facility])     Patient Active Problem List   Diagnosis Date Noted  . GI bleed 08/21/2016  . Sepsis secondary to UTI (HCC) 04/28/2016  . Tobacco use 04/27/2016  . Mucosal abnormality of stomach   . Mucosal abnormality of esophagus   . Melena 07/06/2015  . Renal lesion 07/06/2015  . Sepsis (HCC) 06/26/2015  . Anemia of chronic disease 06/26/2015  . UTI (lower urinary tract infection) 06/25/2015  . Aspiration pneumonia (HCC) 05/29/2015  . E-coli UTI 05/28/2015  . Coagulopathy (HCC) 05/28/2015  . Sepsis due to Gram negative bacteria (HCC) 05/27/2015  . Compression fracture of L1 lumbar vertebra (HCC) 05/26/2015  . Macrocytic anemia 05/25/2015  . Alcoholic cirrhosis (HCC) 05/25/2015  . Acute encephalopathy 05/25/2015  . Thrombocytopenia (HCC) 05/25/2015  . Elevated troponin 05/25/2015  . ETOH abuse 05/25/2015  . Anemia 03/22/2015  . Abnormal liver function   . Shoulder impingement syndrome 07/21/2014  . Cholecystitis, acute 07/23/2013  . Trichomonas infection 07/23/2013  . Acute gastroenteritis 07/22/2013  . Alcohol abuse 07/22/2013  . Hyponatremia 07/22/2013  . Hypokalemia 07/22/2013  . Dehydration 07/22/2013  . Macrocytosis 07/22/2013  . Cholelithiasis 07/22/2013  . Renal cyst 07/22/2013    Past Surgical History:  Procedure Laterality Date  . APPENDECTOMY    . BIOPSY N/A 07/26/2015   Procedure: BIOPSY;  Surgeon: Corbin Ade, MD;  Location: [facility] ORS;  Service: Endoscopy;  Laterality: N/A;  Gastric, Esophageal  . COLONOSCOPY     ???  . ESOPHAGOGASTRODUODENOSCOPY  04/2007   RMR: tight peptic stricture status post dilation, antral erosions, superficial ulcerations, submucosal duodenal mass, gastric biopsy positive for H. pylori, duodenal biopsy benign, completed H. pylori treatment  . ESOPHAGOGASTRODUODENOSCOPY  07/2007   RMR: peptic stricture status post dilation, gastric erosions, 2 cm submucosal duodenal mass. Biopsies negative.referred for  endoscopic ultrasound of submucosal duodenal mass but unclear whether this was completed.  . ESOPHAGOGASTRODUODENOSCOPY (EGD) WITH PROPOFOL N/A 07/26/2015   RMR: Abnoramal esophagus/short distal peptic stricture-status post dilation with scope passage- status post biopsy. No varices. Hiatal hernia. Abnormal stomach-status post biopsy. duodenal mass of uncertain significance.     OB History    No data available      Home Medications    Prior to Admission medications   Medication Sig Start Date End Date Taking? Authorizing Provider  acetaminophen-codeine (TYLENOL #3) 300-30 MG tablet Take 1 tablet by mouth every 6 (six) hours as needed for moderate pain.    Historical Provider, MD  albuterol (PROVENTIL HFA;VENTOLIN HFA) 108 (90 BASE) MCG/ACT inhaler Inhale 1-2 puffs into the lungs every 6 (six) hours as needed for wheezing. 06/02/15 01/31/19  Gwenyth Bender, NP    Family History Family History  Problem Relation Age of Onset  . Leukemia Mother   . Cirrhosis Paternal Uncle     etoh  . Colon cancer Neg Hx     Social History Social History  Substance Use Topics  . Smoking status: Current Every Day Smoker    Packs/day: 1.50    Years: 40.00    Types: Cigarettes  . Smokeless tobacco: Never Used  . Alcohol use 12.0 oz/week    20 Shots of liquor per week     Comment: quit etoh 05/23/15. used to drink 1/2 gallon vodka in two days      Allergies   Loratadine   Review of Systems Review of Systems  Respiratory: Negative for shortness of breath.   Cardiovascular: Negative for chest pain.  Gastrointestinal: Positive for blood in stool. Negative for abdominal pain, nausea and vomiting.  Musculoskeletal: Positive for arthralgias and myalgias.  Skin: Positive for color change.  All other systems reviewed and are negative.  Physical Exam Updated Vital Signs BP (!) 75/50   Pulse 119   Temp (!) 96.4 F (35.8 C)   Resp 20   SpO2 100%   Physical Exam Nursing note and vitals  reviewed. Constitutional: Chronically and acutely ill appearing  Head: Normocephalic and atraumatic.  Mouth/Throat: Oropharynx is dry.  Neck: Normal range of motion. Neck supple.  Eyes: Bilateral scleral icterus  Cardiovascular: Tachycardic rate and regular rhythm.   Pulmonary/Chest: Effort normal and breath sounds normal.  Abdominal: Soft. There is no tenderness. There is no rebound and no guarding.  Musculoskeletal: Normal range of motion. left lower extremity externally rotated and appears shortened. TTP with limited ROM.  Neurological: Alert, no facial droop, fluent speech, moves all extremities symmetrically. Oriented x 3.  Skin: Skin is warm and dry. Jaundiced with scattered bruises at various stages of healing.  Psychiatric: Cooperative   ED Treatments / Results  Labs (all labs ordered are listed, but only abnormal results are displayed) Labs Reviewed  CBC WITH DIFFERENTIAL/PLATELET - Abnormal; Notable for the following:       Result Value   WBC 16.2 (*)    RBC 1.58 (*)  Hemoglobin 6.3 (*)    HCT 21.1 (*)    MCV 133.5 (*)    MCH 39.9 (*)    MCHC 29.9 (*)    RDW 18.2 (*)    Platelets 86 (*)    Neutro Abs 13.6 (*)    Monocytes Absolute 1.3 (*)    All other components within normal limits  COMPREHENSIVE METABOLIC PANEL - Abnormal; Notable for the following:    Potassium 3.0 (*)    Chloride 90 (*)    CO2 14 (*)    Glucose, Bld 140 (*)    Creatinine, Ser 2.00 (*)    Calcium 8.0 (*)    Albumin 2.1 (*)    AST 221 (*)    Total Bilirubin 8.1 (*)    GFR calc non Af Amer 26 (*)    GFR calc Af Amer 31 (*)    Anion gap 31 (*)    All other components within normal limits  LIPASE, BLOOD - Abnormal; Notable for the following:    Lipase 56 (*)    All other components within normal limits  PROTIME-INR - Abnormal; Notable for the following:    Prothrombin Time 33.8 (*)    All other components within normal limits  APTT - Abnormal; Notable for the following:    aPTT 51 (*)      All other components within normal limits  AMMONIA - Abnormal; Notable for the following:    Ammonia 64 (*)    All other components within normal limits  URINALYSIS, ROUTINE W REFLEX MICROSCOPIC - Abnormal; Notable for the following:    Color, Urine AMBER (*)    APPearance CLOUDY (*)    Glucose, UA 100 (*)    Bilirubin Urine MODERATE (*)    Protein, ur 30 (*)    Nitrite POSITIVE (*)    Leukocytes, UA TRACE (*)    All other components within normal limits  TROPONIN I - Abnormal; Notable for the following:    Troponin I 1.60 (*)    All other components within normal limits  LACTIC ACID, PLASMA - Abnormal; Notable for the following:    Lactic Acid, Venous 18.8 (*)    All other components within normal limits  LACTIC ACID, PLASMA - Abnormal; Notable for the following:    Lactic Acid, Venous 15.0 (*)    All other components within normal limits  FIBRINOGEN - Abnormal; Notable for the following:    Fibrinogen 96 (*)    All other components within normal limits  URINALYSIS, MICROSCOPIC (REFLEX) - Abnormal; Notable for the following:    Bacteria, UA MANY (*)    Squamous Epithelial / LPF 6-30 (*)    All other components within normal limits  CK - Abnormal; Notable for the following:    Total CK 425 (*)    All other components within normal limits  CBG MONITORING, ED - Abnormal; Notable for the following:    Glucose-Capillary 129 (*)    All other components within normal limits  POC OCCULT BLOOD, ED - Abnormal; Notable for the following:    Fecal Occult Bld POSITIVE (*)    All other components within normal limits  I-STAT CG4 LACTIC ACID, ED - Abnormal; Notable for the following:    Lactic Acid, Venous >17.00 (*)    All other components within normal limits  I-STAT CHEM 8, ED - Abnormal; Notable for the following:    Potassium 3.3 (*)    Chloride 90 (*)    Creatinine, Ser 2.20 (*)  Glucose, Bld 147 (*)    Calcium, Ion 0.81 (*)    Hemoglobin 7.5 (*)    HCT 22.0 (*)    All  other components within normal limits  I-STAT TROPOININ, ED - Abnormal; Notable for the following:    Troponin i, poc 1.11 (*)    All other components within normal limits  CULTURE, BLOOD (ROUTINE X 2)  CULTURE, BLOOD (ROUTINE X 2)  URINE CULTURE  ETHANOL  TYPE AND SCREEN  PREPARE RBC (CROSSMATCH)  PREPARE FRESH FROZEN PLASMA    EKG  EKG Interpretation  Date/Time:  Monday August 21 2016 12:16:09 EST Ventricular Rate:  130 PR Interval:    QRS Duration: 93 QT Interval:  313 QTC Calculation: 461 R Axis:   57 Text Interpretation:  Sinus tachycardia Borderline ST depression, anterolateral leads new tachycardia and anterolateral ST depression  Confirmed by Jakeisha Stricker MD, Kimora Stankovic [phone]) on 08/21/2016 12:56:18 PM       Radiology Dg Chest 1 View  Result Date: 08/21/2016 CLINICAL DATA:  Fall, left hip pain EXAM: CHEST 1 VIEW COMPARISON:  06/25/2015 FINDINGS: Cardiomediastinal silhouette is stable. No infiltrate or pleural effusion. No pulmonary edema. Minimal thoracic dextroscoliosis. IMPRESSION: No active disease. Electronically Signed   By: Natasha MeadLiviu  Pop M.D.   On: 08/21/2016 13:54   Ct Head Wo Contrast  Result Date: 08/21/2016 CLINICAL DATA:  Fall and altered mental status EXAM: CT HEAD WITHOUT CONTRAST CT CERVICAL SPINE WITHOUT CONTRAST TECHNIQUE: Multidetector CT imaging of the head and cervical spine was performed following the standard protocol without intravenous contrast. Multiplanar CT image reconstructions of the cervical spine were also generated. COMPARISON:  Head CT 05/25/2015 and 03/24/2010. FINDINGS: CT HEAD FINDINGS Brain: No mass lesion, intraparenchymal hemorrhage or extra-axial collection. No evidence of acute cortical infarct. There is encephalomalacia within the left frontal lobe at the site of prior MCA infarct. There is mildly age advanced volume loss. No hydrocephalus. Vascular: No hyperdense vessel or unexpected calcification. Skull: Normal visualized skull base, calvarium  and extracranial soft tissues. Sinuses/Orbits: No sinus fluid levels or advanced mucosal thickening. No mastoid effusion. Normal orbits. CT CERVICAL SPINE FINDINGS Alignment: No static subluxation. Facets are aligned. Occipital condyles are normally positioned. Reversal of the normal cervical lordosis. Minimal C5-C6 anterolisthesis slightly increased relative to 03/24/2010, possibly positional. Skull base and vertebrae: No acute fracture. Soft tissues and spinal canal: No prevertebral fluid or swelling. No visible canal hematoma. Disc levels: No advanced spinal canal or neural foraminal stenosis. Upper chest: No pneumothorax, pulmonary nodule or pleural effusion. Other: Normal visualized paraspinal cervical soft tissues. IMPRESSION: 1. No acute intracranial abnormality. 2. No acute fracture of the cervical spine. 3. 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 Electronically Signed   By: Deatra RobinsonKevin  Herman M.D.   On: 08/21/2016 14:40   Ct Cervical Spine Wo Contrast  Result Date: 08/21/2016 CLINICAL DATA:  Fall and altered mental status EXAM: CT HEAD WITHOUT CONTRAST CT CERVICAL SPINE WITHOUT CONTRAST TECHNIQUE: Multidetector CT imaging of the head and cervical spine was performed following the standard protocol without intravenous contrast. Multiplanar CT image reconstructions of the cervical spine were also generated. COMPARISON:  Head CT 05/25/2015 and 03/24/2010. FINDINGS: CT HEAD FINDINGS Brain: No mass lesion, intraparenchymal hemorrhage or extra-axial collection. No evidence of acute cortical infarct. There is encephalomalacia within the left frontal lobe at the site of prior MCA infarct. There is mildly age advanced volume loss. No hydrocephalus. Vascular: No hyperdense vessel or unexpected calcification. Skull: Normal visualized skull base,  calvarium and extracranial soft tissues. Sinuses/Orbits: No sinus fluid levels or advanced mucosal thickening. No mastoid effusion. Normal  orbits. CT CERVICAL SPINE FINDINGS Alignment: No static subluxation. Facets are aligned. Occipital condyles are normally positioned. Reversal of the normal cervical lordosis. Minimal C5-C6 anterolisthesis slightly increased relative to 03/24/2010, possibly positional. Skull base and vertebrae: No acute fracture. Soft tissues and spinal canal: No prevertebral fluid or swelling. No visible canal hematoma. Disc levels: No advanced spinal canal or neural foraminal stenosis. Upper chest: No pneumothorax, pulmonary nodule or pleural effusion. Other: Normal visualized paraspinal cervical soft tissues. IMPRESSION: 1. No acute intracranial abnormality. 2. No acute fracture of the cervical spine. 3. 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 Electronically Signed   By: Deatra RobinsonKevin  Herman M.D.   On: 08/21/2016 14:40   Dg Chest Portable 1 View  Result Date: 08/21/2016 CLINICAL DATA:  Central line placement. EXAM: PORTABLE CHEST 1 VIEW COMPARISON:  Radiograph of same day. FINDINGS: The heart size and mediastinal contours are within normal limits. Both lungs are clear. The visualized skeletal structures are unremarkable. Interval placement of right internal jugular catheter with distal tip in expected position of cavoatrial junction. No pneumothorax or pleural effusion is noted. IMPRESSION: Interval placement of right internal jugular catheter with distal tip in expected position of cavoatrial junction. No pneumothorax is noted. Electronically Signed   By: Lupita RaiderJames  Green Jr, M.D.   On: 08/21/2016 15:01   Dg Hip Unilat W Or Wo Pelvis 2-3 Views Left  Result Date: 08/21/2016 CLINICAL DATA:  Left hip pain since a fall 08/18/2016. Patient found down 3 days later. EXAM: DG HIP (WITH OR WITHOUT PELVIS) 2-3V LEFT COMPARISON:  None. FINDINGS: Acute left intertrochanteric fracture is identified. Left femoral head is located. No other acute abnormality. IMPRESSION: Acute left intertrochanteric fracture.  Electronically Signed   By: Drusilla Kannerhomas  Dalessio M.D.   On: 08/21/2016 13:54    Procedures Procedures  COORDINATION OF CARE: 12:26 PM Discussed next steps with pt. Pt verbalized understanding and is agreeable with the plan.    Medications Ordered in ED Medications  pantoprazole (PROTONIX) 80 mg in sodium chloride 0.9 % 250 mL (0.32 mg/mL) infusion (8 mg/hr Intravenous New Bag/Given 08/21/16 1502)  pantoprazole (PROTONIX) injection 40 mg (not administered)  octreotide (SANDOSTATIN) 500 mcg in sodium chloride 0.9 % 250 mL (2 mcg/mL) infusion (50 mcg/hr Intravenous New Bag/Given 08/21/16 1303)  norepinephrine (LEVOPHED) 4 mg in dextrose 5 % 250 mL (0.016 mg/mL) infusion (5 mcg/min Intravenous New Bag/Given 08/21/16 1649)  sodium chloride 0.9 % bolus 1,000 mL (0 mLs Intravenous Stopped 08/21/16 1511)  pantoprazole (PROTONIX) 80 mg in sodium chloride 0.9 % 100 mL IVPB (0 mg Intravenous Stopped 08/21/16 1511)  sodium chloride 0.9 % bolus 1,000 mL (0 mLs Intravenous Stopped 08/21/16 1512)  vancomycin (VANCOCIN) IVPB 1000 mg/200 mL premix (0 mg Intravenous Stopped 08/21/16 1616)  piperacillin-tazobactam (ZOSYN) IVPB 3.375 g (0 g Intravenous Stopped 08/21/16 1538)  0.9 %  sodium chloride infusion (10 mL/hr Intravenous New Bag/Given 08/21/16 1538)  0.9 %  sodium chloride infusion ( Intravenous New Bag/Given 08/21/16 1539)     Initial Impression / Assessment and Plan / ED Course  I have reviewed the triage vital signs and the nursing notes.  Pertinent labs & imaging results that were available during my care of the patient were reviewed by me and considered in my medical decision making (see chart for details).  Clinical Course     Patient appearing very ill with  tachycardia 130-140s and soft BP 90s-100s. With signs of GI bleeding on rectal exam. Concern for sepsis as well. Poor IV access, so central line was placed.   Hgb 6.3 with coagulopathy INR 3.6, Thrombocytopenia, and other signs of liver  failure. Lactic of 18. Signs of hypoperfusion and end organ damage, w/ elevated troponin, AKI.   In regards to GI bleeding, she is receiving 2 units of blood, one of FFP, and cryoprecipitate given low fibrinogen levels. No prior varices unless endoscopy, but due to her poor liver failure I did start an octreotide drip and protonic drip.   Evidence of UTI on sepsis workup. Received broad-spectrum antibiotics including vancomycin and Zosyn. 2 L of IVF infused prior to blood.   Ongoing hypotension while blood infusing. STtarting on pressors/levophed.  From trauma and fall standpoint with left closed intertrochanteric hip fracture that is neurovascularly intact. CT head and cervical spine shows no other serious traumatic injury. Abdomen is benign.  Discussed with Dr. Marguerita Merles from ICU who will accept to ICU at [facility]. Discussed with Dr. Lavon Paganini from GI at [facility] who will see for GI bleed. Also spoke with Dr. August Saucer from ortho regarding hip fracture from fall.  CRITICAL CARE Performed by: Lavera Guise   Total critical care time: 60 minutes  Critical care time was exclusive of separately billable procedures and treating other patients.  Critical care was necessary to treat or prevent imminent or life-threatening deterioration.  Critical care was time spent personally by me on the following activities: development of treatment plan with patient and/or surrogate as well as nursing, discussions with consultants, evaluation of patient's response to treatment, examination of patient, obtaining history from patient or surrogate, ordering and performing treatments and interventions, ordering and review of laboratory studies, ordering and review of radiographic studies, pulse oximetry and re-evaluation of patient's condition.  CENTRAL LINE Performed by: Lavera Guise Consent: The procedure was performed in an emergent situation. Required items: required blood products, implants, devices, and  special equipment available Patient identity confirmed: arm band and provided demographic data Time out: Immediately prior to procedure a "time out" was called to verify the correct patient, procedure, equipment, support staff and site/side marked as required. Indications: vascular access Anesthesia: local infiltration Local anesthetic: lidocaine 1% with epinephrine Anesthetic total: 3 ml Patient sedated: no Preparation: skin prepped with 2% chlorhexidine Skin prep agent dried: skin prep agent completely dried prior to procedure Sterile barriers: all five maximum sterile barriers used - cap, mask, sterile gown, sterile gloves, and large sterile sheet Hand hygiene: hand hygiene performed prior to central venous catheter insertion  Location details: right IJ  Catheter type: triple lumen Catheter size: 8 Fr Pre-procedure: landmarks identified Ultrasound guidance: yes Successful placement: yes Post-procedure: line sutured and dressing applied Assessment: blood return through all parts, free fluid flow, placement verified by x-ray and no pneumothorax on x-ray Patient tolerance: Patient tolerated the procedure well with no immediate complications.    I personally performed the services described in this documentation, which was scribed in my presence. The recorded information has been reviewed and is accurate.  Final Clinical Impressions(s) / ED Diagnoses   Final diagnoses:  Acute upper GI bleed  Chronic liver failure with hepatic coma (HCC)  AKI (acute kidney injury) (HCC)  Sepsis, due to unspecified organism [facility])  Alcoholic cirrhosis of liver without ascites (HCC)  Acute blood loss anemia  Shock (HCC)    New Prescriptions New Prescriptions   No medications on file  Lavera Guise, MD 08/21/16 1651    Lavera Guise, MD 08/21/16 [phone]

## 2016-08-21 NOTE — ED Notes (Signed)
CRITICAL VALUE ALERT  Critical value received: fibrinogen 96  Date of notification:  08/21/16  Time of notification:  1509  Critical value read back: yes  Nurse who received alert:  Agustin Creeobin K MD notified (1st page):  Dr Joni FearsLui  Time of first page:  1509

## 2016-08-21 NOTE — Progress Notes (Signed)
CRITICAL VALUE ALERT  Critical value received:  Lactic acid 8.4  Date of notification:  08/21/2016  Time of notification:  2230  Critical value read back:Yes.    Nurse who received alert:  Lillia CorporalHancock, Dakisha Schoof Elizabeth  MD notified (1st page):  P. Mikey BussingHoffman NP  Time of first page:  2233  MD notified (2nd page):  Time of second page:  Responding MD:  P. Mikey BussingHoffman NP  Time MD responded:  2233

## 2016-08-21 NOTE — Progress Notes (Signed)
eLink Physician-Brief Progress Note Patient Name: [name] DOB: 23-Nov-1955 MRN: [id number]   Date of Service  08/21/2016  HPI/Events of Note  Oliguria - BP = 89/53 with MAP = 65. CVP = 7.   eICU Interventions  Will order: 1. Bolus with 0.9 NaCl 1 liter over 1 hour now.      Intervention Category Intermediate Interventions: Oliguria - evaluation and management  Philamena Kramar Eugene 08/21/2016, 10:14 PM

## 2016-08-21 NOTE — Progress Notes (Signed)
X-rays reviewed.   Left hip intertrochanteric fracture will require fixation once medically stable. [facility]

## 2016-08-21 NOTE — ED Notes (Signed)
Awaiting pt return from radiology to administer antibiotics.

## 2016-08-21 NOTE — ED Notes (Signed)
CRITICAL VALUE ALERT  Critical value received: hgb 6.3  Date of notification:  08/21/16  Time of notification:  1316  Critical value read back: yes  Nurse who received alert:  Garald BraverJ. Addylin Manke, rn  MD notified (1st page):  Joni FearsLui  Time of first page:  1331  MD notified (2nd page):  Time of second page:  Responding MD:  Joni FearsLui  Time MD responded:  1332

## 2016-08-21 NOTE — ED Notes (Signed)
Lactic 18.8 AST-221 ALT-54   EDP notified.

## 2016-08-21 NOTE — ED Notes (Signed)
Pt to radiology.

## 2016-08-21 NOTE — ED Notes (Signed)
Pt difficulty IV stick with poor venous access. Central Line placed to administer antibiotics and blood products.

## 2016-08-21 NOTE — ED Notes (Signed)
Pt returned to room. Setting up for central line placement by EDP.

## 2016-08-21 NOTE — H&P (Signed)
PULMONARY / CRITICAL CARE MEDICINE   Name: CHERYLANN Valentine MRN: [id number] DOB: 25-Oct-1955    ADMISSION DATE:  08/21/2016 CONSULTATION DATE:  08/21/16  REFERRING MD:  Verdie Valentine - EDP  CHIEF COMPLAINT:  GI Bleed  HISTORY OF PRESENT ILLNESS: [name] is a 60 y.o. female with PMH as outlined below. She presented to [facility] ED 12/11 after her son found her in her home, lying in her bed covered in feces.  She apparently had a mechanical fall 3 days prior and had been lying in bed since that time.   Imaging in ED revealed intertrochanteric hip fx.  She also had positive FOBT and reported that she had been experiencing hematemesis as well as melena.  She states hematemesis would occur several times per day and has been going on for a month or so.  Denies any hematochezia.  She has long standing hx of significant ETOH intake (she reports "three 1/2 gallons of liquor per day), but she states that she quit drinking a few weeks ago.  Due to GI bleed and hip fx, she was transferred to [facility] for further evaluation.  She had endoscopy in Nov 2016 by Dr. 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    PAST MEDICAL HISTORY :  She  has a past medical history of Alcohol abuse; Alcoholic cirrhosis (HCC); Alcoholic dementia (HCC); Anemia of chronic disease; Cholelithiasis; Chronic GI bleeding; Coagulopathy (HCC); Compression fracture; Compression fracture of lumbar vertebra (HCC); GERD (gastroesophageal reflux disease); Hepatic steatosis; Hypertension; Normal cardiac stress test (2008); Stroke [facility]) (2008); and Thrombocytopenia (HCC).  PAST SURGICAL HISTORY: She  has a past surgical history that includes Esophagogastroduodenoscopy (04/2007); Appendectomy; Colonoscopy; Esophagogastroduodenoscopy (07/2007); Esophagogastroduodenoscopy (egd) with propofol (N/A, 07/26/2015); and biopsy (N/A, 07/26/2015).  Allergies  Allergen Reactions  . Loratadine Rash     No current facility-administered medications on file prior to encounter.    Current Outpatient Prescriptions on File Prior to Encounter  Medication Sig  . acetaminophen-codeine (TYLENOL #3) 300-30 MG tablet Take 1 tablet by mouth every 6 (six) hours as needed for moderate pain.  Marland Kitchen albuterol (PROVENTIL HFA;VENTOLIN HFA) 108 (90 BASE) MCG/ACT inhaler Inhale 1-2 puffs into the lungs every 6 (six) hours as needed for wheezing.    FAMILY HISTORY:  Her indicated that her mother is deceased. She indicated that her father is alive. She indicated that the status of her paternal uncle is unknown. She indicated that the status of her neg hx is unknown.    SOCIAL HISTORY: She  reports that she has been smoking Cigarettes.  She has a 60.00 pack-year smoking history. She has never used smokeless tobacco. She reports that she drinks about 12.0 oz of alcohol per week . She reports that she does not use drugs.  REVIEW OF SYSTEMS:   All negative; except for those that are bolded, which indicate positives.  Constitutional: weight loss, weight gain, night sweats, fevers, chills, fatigue, weakness.  HEENT: headaches, sore throat, sneezing, nasal congestion, post nasal drip, difficulty swallowing, tooth/dental problems, visual complaints, visual changes, ear aches. Neuro: difficulty with speech, weakness, numbness, ataxia. CV:  chest pain, orthopnea, PND, swelling in lower extremities, dizziness, palpitations, syncope.  Resp: cough, hemoptysis, dyspnea, wheezing. GI: heartburn, indigestion, abdominal pain, nausea, vomiting, diarrhea, constipation, change in bowel habits, loss of appetite, hematemesis, melena, hematochezia.  GU: dysuria, change in color of urine, urgency or frequency, flank pain, hematuria. MSK: left hip / leg pain with  decreased ROM. Psych: change in mood or affect, depression, anxiety, suicidal ideations, homicidal ideations. Skin: rash, itching, bruising.   SUBJECTIVE: feels hungry,  wants to eat and drink.  VITAL SIGNS: BP 97/56   Pulse (!) 136   Temp 98.4 F (36.9 C)   Resp (!) 28   SpO2 97%   HEMODYNAMICS:    VENTILATOR SETTINGS:    INTAKE / OUTPUT: I/O last 3 completed shifts: In: 2580 [Blood:230; IV Piggyback:2350] Out: 150 [Urine:150]   PHYSICAL EXAMINATION: General: Adult female, in NAD. Neuro: A&O x 3, non-focal.  HEENT: [place]/AT. PERRL, sclerael icterus. Cardiovascular: RRR, 3/6 SEM. Lungs: Respirations even and unlabored.  CTA bilaterally, No W/R/R. Abdomen: BS x 4, soft, NT/ND.  Musculoskeletal: left hip tenderness. Skin: Jaundiced, warm, no rashes.  LABS:  BMET  Recent Labs Lab 08/21/16 1238 08/21/16 1249  NA 135 135  K 3.3* 3.0*  CL 90* 90*  CO2  --  14*  BUN 14 15  CREATININE 2.20* 2.00*  GLUCOSE 147* 140*    Electrolytes  Recent Labs Lab 08/21/16 1249  CALCIUM 8.0*    CBC  Recent Labs Lab 08/21/16 1238 08/21/16 1249  WBC  --  16.2*  HGB 7.5* 6.3*  HCT 22.0* 21.1*  PLT  --  86*    Coag's  Recent Labs Lab 08/21/16 1249  APTT 51*  INR 3.24    Sepsis Markers  Recent Labs Lab 08/21/16 1238 08/21/16 1256 08/21/16 1520  LATICACIDVEN >17.00* 18.8* 15.0*    ABG No results for input(s): PHART, PCO2ART, PO2ART in the last 168 hours.  Liver Enzymes  Recent Labs Lab 08/21/16 1249  AST 221*  ALT 54  ALKPHOS 92  BILITOT 8.1*  ALBUMIN 2.1*    Cardiac Enzymes  Recent Labs Lab 08/21/16 1256  TROPONINI 1.60*    Glucose  Recent Labs Lab 08/21/16 1203 08/21/16 1750  GLUCAP 129* 132*    Imaging Dg Chest 1 View  Result Date: 08/21/2016 CLINICAL DATA:  Fall, left hip pain EXAM: CHEST 1 VIEW COMPARISON:  06/25/2015 FINDINGS: Cardiomediastinal silhouette is stable. No infiltrate or pleural effusion. No pulmonary edema. Minimal thoracic dextroscoliosis. IMPRESSION: No active disease. Electronically Signed   By: Natasha Mead M.D.   On: 08/21/2016 13:54   Ct Head Wo Contrast  Result Date:  08/21/2016 CLINICAL DATA:  Fall and altered mental status EXAM: CT HEAD WITHOUT CONTRAST CT CERVICAL SPINE WITHOUT CONTRAST TECHNIQUE: Multidetector CT imaging of the head and cervical spine was performed following the standard protocol without intravenous contrast. Multiplanar CT image reconstructions of the cervical spine were also generated. COMPARISON:  Head CT 05/25/2015 and 03/24/2010. FINDINGS: CT HEAD FINDINGS Brain: No mass lesion, intraparenchymal hemorrhage or extra-axial collection. No evidence of acute cortical infarct. There is encephalomalacia within the left frontal lobe at the site of prior MCA infarct. There is mildly age advanced volume loss. No hydrocephalus. Vascular: No hyperdense vessel or unexpected calcification. Skull: Normal visualized skull base, calvarium and extracranial soft tissues. Sinuses/Orbits: No sinus fluid levels or advanced mucosal thickening. No mastoid effusion. Normal orbits. CT CERVICAL SPINE FINDINGS Alignment: No static subluxation. Facets are aligned. Occipital condyles are normally positioned. Reversal of the normal cervical lordosis. Minimal C5-C6 anterolisthesis slightly increased relative to 03/24/2010, possibly positional. Skull base and vertebrae: No acute fracture. Soft tissues and spinal canal: No prevertebral fluid or swelling. No visible canal hematoma. Disc levels: No advanced spinal canal or neural foraminal stenosis. Upper chest: No pneumothorax, pulmonary nodule or pleural effusion. Other: Normal visualized  paraspinal cervical soft tissues. IMPRESSION: 1. No acute intracranial abnormality. 2. No acute fracture of the cervical spine. 3. 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 Electronically Signed   By: Deatra RobinsonKevin  Herman M.D.   On: 08/21/2016 14:40   Ct Cervical Spine Wo Contrast  Result Date: 08/21/2016 CLINICAL DATA:  Fall and altered mental status EXAM: CT HEAD WITHOUT CONTRAST CT CERVICAL SPINE WITHOUT CONTRAST  TECHNIQUE: Multidetector CT imaging of the head and cervical spine was performed following the standard protocol without intravenous contrast. Multiplanar CT image reconstructions of the cervical spine were also generated. COMPARISON:  Head CT 05/25/2015 and 03/24/2010. FINDINGS: CT HEAD FINDINGS Brain: No mass lesion, intraparenchymal hemorrhage or extra-axial collection. No evidence of acute cortical infarct. There is encephalomalacia within the left frontal lobe at the site of prior MCA infarct. There is mildly age advanced volume loss. No hydrocephalus. Vascular: No hyperdense vessel or unexpected calcification. Skull: Normal visualized skull base, calvarium and extracranial soft tissues. Sinuses/Orbits: No sinus fluid levels or advanced mucosal thickening. No mastoid effusion. Normal orbits. CT CERVICAL SPINE FINDINGS Alignment: No static subluxation. Facets are aligned. Occipital condyles are normally positioned. Reversal of the normal cervical lordosis. Minimal C5-C6 anterolisthesis slightly increased relative to 03/24/2010, possibly positional. Skull base and vertebrae: No acute fracture. Soft tissues and spinal canal: No prevertebral fluid or swelling. No visible canal hematoma. Disc levels: No advanced spinal canal or neural foraminal stenosis. Upper chest: No pneumothorax, pulmonary nodule or pleural effusion. Other: Normal visualized paraspinal cervical soft tissues. IMPRESSION: 1. No acute intracranial abnormality. 2. No acute fracture of the cervical spine. 3. 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 Electronically Signed   By: Deatra RobinsonKevin  Herman M.D.   On: 08/21/2016 14:40   Dg Chest Portable 1 View  Result Date: 08/21/2016 CLINICAL DATA:  Central line placement. EXAM: PORTABLE CHEST 1 VIEW COMPARISON:  Radiograph of same day. FINDINGS: The heart size and mediastinal contours are within normal limits. Both lungs are clear. The visualized skeletal structures are  unremarkable. Interval placement of right internal jugular catheter with distal tip in expected position of cavoatrial junction. No pneumothorax or pleural effusion is noted. IMPRESSION: Interval placement of right internal jugular catheter with distal tip in expected position of cavoatrial junction. No pneumothorax is noted. Electronically Signed   By: Lupita RaiderJames  Green Jr, M.D.   On: 08/21/2016 15:01   Dg Hip Unilat W Or Wo Pelvis 2-3 Views Left  Result Date: 08/21/2016 CLINICAL DATA:  Left hip pain since a fall 08/18/2016. Patient found down 3 days later. EXAM: DG HIP (WITH OR WITHOUT PELVIS) 2-3V LEFT COMPARISON:  None. FINDINGS: Acute left intertrochanteric fracture is identified. Left femoral head is located. No other acute abnormality. IMPRESSION: Acute left intertrochanteric fracture. Electronically Signed   By: Drusilla Kannerhomas  Dalessio M.D.   On: 08/21/2016 13:54     STUDIES:  CT head 12/11 > negative. Left hip 12/11 > left intertrochanteric hip fx. CXR 12/11 > negative.  CULTURES: Blood 12/11 >  ANTIBIOTICS: Zozyn x 1, vanc x 1.  SIGNIFICANT EVENTS: 12/11 > admitted with GI bleed and left hip fx.  LINES/TUBES: R IJ CVL 12/11 >  DISCUSSION: 60 y.o. female with PMH of cirrhosis, GI bleed, ETOH abuse, admitted 12/11 with recurrent GI bleed as well as acute left intertrochanteric hip fx following mechanical fall 3 days prior.  ASSESSMENT / PLAN:  GASTROINTESTINAL A:   GI bleed - unclear etiology, concern for varices given significant  EtOH abuse (though EGD in 2016 did not show varices). Hx hepatic steatosis, GERD, cholelithiasis. Transaminitis - AST > ALT c/w ETOH abuse. Nutrition. P:   GI consulted by EDP, agreed to see tonight. Appreciate the assistance. Continue octreotide gtt, PPI gtt. NPO.  HEMATOLOGIC A:   Anemia - presumed due to acute blood loss - s/p 2u PRBC, 1u FFP. Coagulopathy - presumed due to ETOH liver disease. Hx thrombocytopenia - presumed due to bone marrow  suppression from EtOH abuse. VTE Prophylaxis. P:  H/H q6hrs. Transfuse for Hgb < 7. SCD's only.  PULMONARY A: No acute issues. P:   Keep head of bed elevated in case hematemesis recurs. If has recurrence, may need intubation prior to EGD.  CARDIOVASCULAR A:  Hypotension - started on low-dose levo fed while in ED.  Suspect primarily related to blood loss as well as decreased PO intake. Hx HTN. P:  Continue levophed, wean to off. Goal MAP > 65. Trend troponin, lactate.  RENAL A:   Hypokalemia. AKI - suspect hypovolemic. AGMA - lactate. Hypocalcemia. P:   NS @ 125. Repeat BMP this evening. 1g Ca gluconate.  INFECTIOUS A:   Leukocytosis - presumed acute phase reactant. No indication of infection. P:   Monitor clinically. Assess PCT - if high, then start empiric abx.  ENDOCRINE A:   No acute issues.   P:   Monitor glucose on BMP.  NEUROLOGIC A:   Hx CVA, ETOH abuse. P:   Thiamine, folate. ETOH cessation counseling.  MUSCULOSKELETAL A: Left hip intertrochanteric fx. P: Ortho consulted by EDP, agreed to see.  Appreciate the assistance.  Family updated: None available.  Interdisciplinary Family Meeting v Palliative Care Meeting:  Due by: 12/17.  CC time: 40 minutes.   Rutherford Guysahul Desai, [place]PA Sidonie Dickens[facility] Pager: [phone]  or [phone] 08/21/2016, 7:17 PM   STAFF NOTE: I, Rory Percyaniel Jaceon Heiberger, MD FACP have personally reviewed patient's available data, including medical history, events of note, physical examination and test results as part of my evaluation. I have discussed with resident/NP and other care providers such as pharmacist, RN and RRT. In addition, I personally evaluated patient and elicited key findings of: awake, eating ice chips, mild tachy, abdo soft, no distress, hgb noted, GI bleed, she describes black tar stool, no hematemesis, remains on levophed 5, will get cvp, if not off levo in 1 hour will  transition to neo, consider empiric abdo prevention, octreotide, ffp, vit k , ppi drip, npio, gi to see, will follow closely cvp, repeat coags, lactic is down The patient is critically ill with multiple organ systems failure and requires high complexity decision making for assessment and support, frequent evaluation and titration of therapies, application of advanced monitoring technologies and extensive interpretation of multiple databases.   Critical Care Time devoted to patient care services described in this note is 35 Minutes. This time reflects time of care of this signee: Rory Percyaniel Nickolaos Brallier, MD FACP. This critical care time does not reflect procedure time, or teaching time or supervisory time of PA/NP/Med student/Med Resident etc but could involve care discussion time. Rest per NP/medical resident whose note is outlined above and that I agree with   Mcarthur Rossettianiel J. Tyson AliasFeinstein, MD, FACP Pgr: [phone] [facility] 08/21/2016 8:04 PM

## 2016-08-21 NOTE — ED Triage Notes (Signed)
Per EMS, son called them out due to his mom falling on Friday. Pt was found by son lying in bed covered with feces. Pt complaining of pain in left hip when moved. Deformity noted. Pt is confused and unable to answer any questions. Per EMS, pt's CBG was 38 on scene. Pt was given an amp of D50 prior to arrival.

## 2016-08-21 NOTE — ED Notes (Signed)
CRITICAL VALUE ALERT  Critical value received:  Troponin 1.60  Date of notification:  08/21/2016  Time of notification:  1351  Critical value read back:yes  Nurse who received alert:  Rory PercyS. Blanca Thornton   MD notified (1st page):  Liu   Time of first page:  1351  MD notified (2nd page):  Time of second page:  Responding MD:  Verdie MosherLiu  Time MD responded:  1351

## 2016-08-21 NOTE — Progress Notes (Signed)
CRITICAL VALUE ALERT  Critical value received:  Troponin 1.66 and K+3  Date of notification:  08/21/2016  Time of notification:  2240  Critical value read back:Yes.    Nurse who received alert:  Lillia CorporalHancock, Tamari Busic Elizabeth  MD notified (1st page):  P. Mikey BussingHoffman NP  Time of first page:  2250  MD notified (2nd page):  Time of second page:  Responding MD:  P. Hoffman NP  Time MD responded:  2250

## 2016-08-21 NOTE — ED Notes (Signed)
Bair hugger placed on pt.

## 2016-08-21 NOTE — ED Notes (Signed)
Pt present to ED with generalized bruising to arms, legs, hands, feet and back in various stages of healing. Pt soiled in dries feces. Bed bath given.

## 2016-08-22 ENCOUNTER — Inpatient Hospital Stay (HOSPITAL_COMMUNITY): Payer: Medicaid Other | Admitting: Anesthesiology

## 2016-08-22 ENCOUNTER — Inpatient Hospital Stay (HOSPITAL_COMMUNITY): Payer: Medicaid Other

## 2016-08-22 ENCOUNTER — Encounter (HOSPITAL_COMMUNITY)
Admission: EM | Disposition: A | Discharge status: Skilled Nursing Facility | Payer: Self-pay | Source: Home / Self Care | Attending: Internal Medicine

## 2016-08-22 ENCOUNTER — Encounter (HOSPITAL_COMMUNITY): Payer: Self-pay | Admitting: Anesthesiology

## 2016-08-22 DIAGNOSIS — K2971 Gastritis, unspecified, with bleeding: Secondary | ICD-10-CM

## 2016-08-22 DIAGNOSIS — I213 ST elevation (STEMI) myocardial infarction of unspecified site: Secondary | ICD-10-CM

## 2016-08-22 DIAGNOSIS — D62 Acute posthemorrhagic anemia: Secondary | ICD-10-CM

## 2016-08-22 HISTORY — PX: ESOPHAGOGASTRODUODENOSCOPY: SHX5428

## 2016-08-22 LAB — BASIC METABOLIC PANEL
ANION GAP: 8 (ref 5–15)
ANION GAP: 9 (ref 5–15)
Anion gap: 10 (ref 5–15)
BUN: 14 mg/dL (ref 6–20)
BUN: 14 mg/dL (ref 6–20)
BUN: 18 mg/dL (ref 6–20)
CALCIUM: 6.9 mg/dL — AB (ref 8.9–10.3)
CHLORIDE: 104 mmol/L (ref 101–111)
CHLORIDE: 107 mmol/L (ref 101–111)
CO2: 25 mmol/L (ref 22–32)
CO2: 23 mmol/L (ref 22–32)
CO2: 23 mmol/L (ref 22–32)
CREATININE: 1.7 mg/dL — AB (ref 0.44–1.00)
Calcium: 6.8 mg/dL — ABNORMAL LOW (ref 8.9–10.3)
Calcium: 7 mg/dL — ABNORMAL LOW (ref 8.9–10.3)
Chloride: 102 mmol/L (ref 101–111)
Creatinine, Ser: 1.84 mg/dL — ABNORMAL HIGH (ref 0.44–1.00)
Creatinine, Ser: 1.61 mg/dL — ABNORMAL HIGH (ref 0.44–1.00)
GFR calc Af Amer: 40 mL/min — ABNORMAL LOW (ref >60)
GFR, EST AFRICAN AMERICAN: 34 mL/min — AB (ref >60)
GFR, EST AFRICAN AMERICAN: 37 mL/min — AB (ref >60)
GFR, EST NON AFRICAN AMERICAN: 29 mL/min — AB (ref >60)
GFR, EST NON AFRICAN AMERICAN: 32 mL/min — AB (ref >60)
GFR, EST NON AFRICAN AMERICAN: 34 mL/min — AB (ref >60)
GLUCOSE: 152 mg/dL — AB (ref 65–99)
Glucose, Bld: 104 mg/dL — ABNORMAL HIGH (ref 65–99)
Glucose, Bld: 102 mg/dL — ABNORMAL HIGH (ref 65–99)
POTASSIUM: 3.5 mmol/L (ref 3.5–5.1)
Potassium: 3.1 mmol/L — ABNORMAL LOW (ref 3.5–5.1)
Potassium: 3.8 mmol/L (ref 3.5–5.1)
SODIUM: 135 mmol/L (ref 135–145)
SODIUM: 137 mmol/L (ref 135–145)
Sodium: 139 mmol/L (ref 135–145)

## 2016-08-22 LAB — HEPATIC FUNCTION PANEL
ALBUMIN: 1.8 g/dL — AB (ref 3.5–5.0)
ALT: 42 U/L (ref 14–54)
AST: 205 U/L — ABNORMAL HIGH (ref 15–41)
Alkaline Phosphatase: 61 U/L (ref 38–126)
BILIRUBIN DIRECT: 3.7 mg/dL — AB (ref 0.1–0.5)
BILIRUBIN INDIRECT: 3.2 mg/dL — AB (ref 0.3–0.9)
BILIRUBIN TOTAL: 6.9 mg/dL — AB (ref 0.3–1.2)
Total Protein: 5.9 g/dL — ABNORMAL LOW (ref 6.5–8.1)

## 2016-08-22 LAB — LACTIC ACID, PLASMA
LACTIC ACID, VENOUS: 4.7 mmol/L — AB (ref 0.5–1.9)
LACTIC ACID, VENOUS: 1.9 mmol/L (ref 0.5–1.9)

## 2016-08-22 LAB — TYPE AND SCREEN
ABO/RH(D): AB NEG
Antibody Screen: NEGATIVE
UNIT DIVISION: 0
Unit division: 0

## 2016-08-22 LAB — TROPONIN I: Troponin I: 2.1 ng/mL (ref <0.03)

## 2016-08-22 LAB — ABO/RH: ABO/RH(D): AB NEG

## 2016-08-22 LAB — CBC
HCT: 24.1 % — ABNORMAL LOW (ref 36.0–46.0)
HEMATOCRIT: 25.5 % — AB (ref 36.0–46.0)
HEMOGLOBIN: 8.2 g/dL — AB (ref 12.0–15.0)
Hemoglobin: 7.8 g/dL — ABNORMAL LOW (ref 12.0–15.0)
MCH: 34 pg (ref 26.0–34.0)
MCH: 34.1 pg — ABNORMAL HIGH (ref 26.0–34.0)
MCHC: 32.2 g/dL (ref 30.0–36.0)
MCHC: 32.4 g/dL (ref 30.0–36.0)
MCV: 105.8 fL — ABNORMAL HIGH (ref 78.0–100.0)
MCV: 105.2 fL — ABNORMAL HIGH (ref 78.0–100.0)
PLATELETS: 112 10*3/uL — AB (ref 150–400)
Platelets: 46 10*3/uL — ABNORMAL LOW (ref 150–400)
RBC: 2.41 MIL/uL — ABNORMAL LOW (ref 3.87–5.11)
RBC: 2.29 MIL/uL — ABNORMAL LOW (ref 3.87–5.11)
RDW: 27.8 % — AB (ref 11.5–15.5)
RDW: 28.3 % — AB (ref 11.5–15.5)
WBC: 14.7 10*3/uL — AB (ref 4.0–10.5)
WBC: 10.3 10*3/uL (ref 4.0–10.5)

## 2016-08-22 LAB — ECHOCARDIOGRAM COMPLETE: Weight: 2031.76 oz

## 2016-08-22 LAB — PREPARE FRESH FROZEN PLASMA: Unit division: 0

## 2016-08-22 LAB — AMMONIA: AMMONIA: 50 umol/L — AB (ref 9–35)

## 2016-08-22 LAB — PHOSPHORUS: PHOSPHORUS: 2.4 mg/dL — AB (ref 2.5–4.6)

## 2016-08-22 LAB — MAGNESIUM
MAGNESIUM: 1.4 mg/dL — AB (ref 1.7–2.4)
MAGNESIUM: 2.1 mg/dL (ref 1.7–2.4)

## 2016-08-22 SURGERY — EGD (ESOPHAGOGASTRODUODENOSCOPY)
Anesthesia: Monitor Anesthesia Care

## 2016-08-22 MED ORDER — SODIUM CHLORIDE 0.9 % IV SOLN
30.0000 meq | INTRAVENOUS | Status: DC
  Administered 2016-08-22: 30 meq via INTRAVENOUS
  Filled 2016-08-22 (×2): qty 15

## 2016-08-22 MED ORDER — PANTOPRAZOLE SODIUM 40 MG IV SOLR: 40.0000 mg | Freq: Two times a day (BID) | INTRAVENOUS | Status: DC

## 2016-08-22 MED ORDER — PHYTONADIONE 5 MG PO TABS
10.0000 mg | ORAL_TABLET | Freq: Every day | ORAL | Status: AC
  Administered 2016-08-23 – 2016-08-25 (×3): 10 mg via ORAL
  Filled 2016-08-22 (×4): qty 2

## 2016-08-22 MED ORDER — VITAMIN K1 10 MG/ML IJ SOLN
5.0000 mg | Freq: Once | INTRAVENOUS | Status: AC
  Administered 2016-08-22: 5 mg via INTRAVENOUS
  Filled 2016-08-22: qty 0.5

## 2016-08-22 MED ORDER — LIDOCAINE HCL (CARDIAC) 20 MG/ML IV SOLN
INTRAVENOUS | Status: DC | PRN
  Administered 2016-08-22: 40 mg via INTRAVENOUS

## 2016-08-22 MED ORDER — SODIUM CHLORIDE 0.9 % IV SOLN: Freq: Once | INTRAVENOUS | Status: DC

## 2016-08-22 MED ORDER — IBUPROFEN 200 MG PO TABS
400.0000 mg | ORAL_TABLET | ORAL | Status: DC | PRN
  Administered 2016-08-22: 400 mg via ORAL
  Filled 2016-08-22: qty 2

## 2016-08-22 MED ORDER — MAGNESIUM SULFATE 2 GM/50ML IV SOLN
2.0000 g | Freq: Once | INTRAVENOUS | Status: AC
  Administered 2016-08-22: 2 g via INTRAVENOUS
  Filled 2016-08-22: qty 50

## 2016-08-22 MED ORDER — PANTOPRAZOLE SODIUM 40 MG PO TBEC
40.0000 mg | DELAYED_RELEASE_TABLET | Freq: Every day | ORAL | Status: DC
  Administered 2016-08-23 – 2016-08-31 (×7): 40 mg via ORAL
  Filled 2016-08-22 (×9): qty 1

## 2016-08-22 MED ORDER — PROPOFOL 10 MG/ML IV BOLUS
INTRAVENOUS | Status: DC | PRN
  Administered 2016-08-22: 30 mg via INTRAVENOUS
  Administered 2016-08-22: 20 mg via INTRAVENOUS

## 2016-08-22 MED ORDER — PROPOFOL 500 MG/50ML IV EMUL
INTRAVENOUS | Status: DC | PRN
  Administered 2016-08-22: 75 ug/kg/min via INTRAVENOUS

## 2016-08-22 MED ORDER — SODIUM CHLORIDE 0.9 % IV SOLN
3.0000 g | Freq: Three times a day (TID) | INTRAVENOUS | Status: DC
  Administered 2016-08-22 – 2016-08-26 (×11): 3 g via INTRAVENOUS
  Filled 2016-08-22 (×15): qty 3

## 2016-08-22 NOTE — Care Management Note (Signed)
Case Management Note  Patient Details  Name: Debbie ElseDeborah C Valentine MRN: [id number] Date of Birth: [birth year]  Subjective/Objective:     Pt transferred from [facility] for EGD               Action/Plan:  From home with husband and children.  ETOH abuse and recent falls.  PT eval will performed when pt is stable.  CSW consulted for substance abuse.  CM will continue to follow for discharge needs   Expected Discharge Date:                  Expected Discharge Plan:     In-House Referral:  Clinical Social Work  Discharge planning Services  CM Consult  Post Acute Care Choice:    Choice offered to:     DME Arranged:    DME Agency:     HH Arranged:    HH Agency:     Status of Service:  In process, will continue to follow  If discussed at Long Length of Stay Meetings, dates discussed:    Additional Comments:  Cherylann ParrClaxton, Sharen Youngren S, RN 08/22/2016, 1:47 PM

## 2016-08-22 NOTE — Interval H&P Note (Signed)
History and Physical Interval Note:  08/22/2016 11:08 AM  [name]  has presented today for surgery, with the diagnosis of Cirrhosis of the liver. Stool is FOBT positive.  Anemia.  The various methods of treatment have been discussed with the patient and family. After consideration of risks, benefits and other options for treatment, the patient has consented to  Procedure(s): ESOPHAGOGASTRODUODENOSCOPY (EGD) (N/A) as a surgical intervention .  The patient's history has been reviewed, patient examined, no change in status, stable for surgery.  I have reviewed the patient's chart and labs.  Questions were answered to the patient's satisfaction.     [name]

## 2016-08-22 NOTE — Progress Notes (Signed)
eLink Physician-Brief Progress Note Patient Name: Elmon ElseDeborah C August DOB: 1956/06/09 MRN: [id number]   Date of Service  08/22/2016  HPI/Events of Note  Patient c/o pain related to recent Fx.   eICU Interventions  Will order: 1. Motrin 400 mg PO Q 4 hours PRN pain.     Intervention Category Intermediate Interventions: Pain - evaluation and management  Mazzy Santarelli Eugene 08/22/2016, 10:56 PM

## 2016-08-22 NOTE — Anesthesia Postprocedure Evaluation (Signed)
Anesthesia Post Note  Patient: [name]  Procedure(Valentine) Performed: Procedure(Valentine) (LRB): ESOPHAGOGASTRODUODENOSCOPY (EGD) (N/A)  Patient location during evaluation: PACU Anesthesia Type: MAC Level of consciousness: awake and alert Pain management: pain level controlled Vital Signs Assessment: post-procedure vital signs reviewed and stable Respiratory status: spontaneous breathing, nonlabored ventilation, respiratory function stable and patient connected to nasal cannula oxygen Cardiovascular status: stable and blood pressure returned to baseline Anesthetic complications: no    Last Vitals:  Vitals:   08/22/16 1345 08/22/16 1420  BP:  109/67  Pulse: (!) 103 87  Resp: (!) 32 18  Temp: 36.9 C 36.7 C    Last Pain:  Vitals:   08/22/16 1420  TempSrc: Oral  PainSc:                  [name]

## 2016-08-22 NOTE — Progress Notes (Signed)
CSW attempted Patient re: CSW consult for abuse/neglect. Per consult, "Pt fell at home- family placed back into bed and did not move patient for 3 days. Pt found covered in dried, bloody feces and had not drank Or ate during that time. Pt has history of falls in past". Of note, per MD note, Patient has long standing hx of significant ETOH intake (she reports "three 1/2 gallons of liquor per day), but she states that she quit drinking a few weeks ago".Patient presently in surgery. CSW will follow up regarding concerns at later time.     Enos FlingAshley Jamie Hafford, MSW, LCSW [facility] Clinical Social Worker [phone]

## 2016-08-22 NOTE — Transfer of Care (Signed)
Immediate Anesthesia Transfer of Care Note  Patient: [name]  Procedure(s) Performed: Procedure(s): ESOPHAGOGASTRODUODENOSCOPY (EGD) (N/A)  Patient Location: PACU  Anesthesia Type:MAC  Level of Consciousness: awake, alert  and patient cooperative  Airway & Oxygen Therapy: Patient Spontanous Breathing and Patient connected to nasal cannula oxygen  Post-op Assessment: Report given to RN and Post -op Vital signs reviewed and stable  Post vital signs: Reviewed and stable  Last Vitals:  Vitals:   08/22/16 1055 08/22/16 1142  BP: (!) 101/53 (!) 94/50  Pulse: (!) 108 94  Resp: (!) 23 (!) 27  Temp:  36.3 C    Last Pain:  Vitals:   08/22/16 1142  TempSrc: Oral  PainSc: 5          Complications: No apparent anesthesia complications

## 2016-08-22 NOTE — Progress Notes (Signed)
Pharmacy Antibiotic Note  Elmon ElseDeborah C Ahonen is a 60 y.o. female admitted on 08/21/2016 with aspiration pneumonia.  Pharmacy has been consulted for Unasyn dosing. SCr is trending down (1.70) with estimated CrCL ~ 30-35 mL/min. WBC trending down at 14.7. Afebrile (hypothermic on admission). Infiltrate on CXR.   Plan: Unasyn 3g IV every 8 hours.  Monitor renal function, culture results, and clinical status.  Follow-up length of therapy.   Weight: 126 lb 15.8 oz (57.6 kg)  Temp (24hrs), Avg:97.2 F (36.2 C), Min:96.4 F (35.8 C), Max:98.6 F (37 C)   Recent Labs Lab 08/21/16 1238 08/21/16 1249 08/21/16 1256 08/21/16 1520 08/21/16 2130 08/22/16 0100 08/22/16 0640  WBC  --  16.2*  --   --   --  14.7*  --   CREATININE 2.20* 2.00*  --   --  1.93* 1.84* 1.70*  LATICACIDVEN >17.00*  --  18.8* 15.0* 8.4* 4.7*  --     Estimated Creatinine Clearance: 32.8 mL/min (by C-G formula based on SCr of 1.7 mg/dL (H)).    Allergies  Allergen Reactions  . Loratadine Rash    Antimicrobials this admission: Vancomycin 12/11 x1 Zosyn 12/11 x1 Unasyn 12/12 >>  Dose adjustments this admission:   Microbiology results: 12/11 BCx:  12/11 UCx:  12/11 Group A strep - no result 12/11 MRSA PCR:   Thank you for allowing pharmacy to be a part of this patient's care.  Link SnufferJessica Lorraine Cimmino, PharmD, BCPS Clinical Pharmacist [phone] 08/22/2016 9:16 AM

## 2016-08-22 NOTE — Op Note (Addendum)
[facility] Patient Name: [name] Procedure Date : 08/22/2016 MRN: [id number] Attending MD: Napoleon Form , MD Date of Birth: 1956-02-17 CSN: [id number] Age: 60 Admit Type: Inpatient Procedure:                Upper GI endoscopy Indications:              Recent gastrointestinal bleeding, Suspected upper                            gastrointestinal bleeding Providers:                Napoleon Form, MD, Will Bonnet RN, RN,                            Clearnce Sorrel, Technician, Joneen Caraway CRNA, CRNA Referring MD:              Medicines:                Monitored Anesthesia Care Complications:            No immediate complications. Estimated Blood Loss:     Estimated blood loss was minimal. Procedure:                Pre-Anesthesia Assessment:                           - Prior to the procedure, a History and Physical                            was performed, and patient medications and                            allergies were reviewed. The patient's tolerance of                            previous anesthesia was also reviewed. The risks                            and benefits of the procedure and the sedation                            options and risks were discussed with the patient.                            All questions were answered, and informed consent                            was obtained. Prior Anticoagulants: The patient has                            taken no previous anticoagulant or antiplatelet                            agents. ASA Grade Assessment: IV - A patient with  severe systemic disease that is a constant threat                            to life. After reviewing the risks and benefits,                            the patient was deemed in satisfactory condition to                            undergo the procedure.                           After obtaining informed consent, the endoscope was                passed under direct vision. Throughout the                            procedure, the patient's blood pressure, pulse, and                            oxygen saturations were monitored continuously. The                            [id number] ([id number] scope was introduced through the                            mouth, and advanced to the lower third of                            esophagus. The [id number]) scope was                            introduced through the mouth, and advanced to the                            second part of duodenum. The upper GI endoscopy was                            accomplished without difficulty. The patient                            tolerated the procedure well. Scope In: Scope Out: Findings:      One severe (stenosis; an endoscope cannot pass) benign-appearing,       intrinsic stenosis was found 36 to 38 cm from the incisors. This       measured 5 mm (inner diameter) x 1 cm (in length) and was traversed       after downsizing scope to pediatric scope. Dilation was not performed       given low platelet count <50 and elevated INR>3. No evidence of       esophageal varices      Mild portal hypertensive gastropathy was found in the entire examined       stomach. No evidence of gastric varices      A medium-sized elongated polypoid mass with no  bleeding was found in the       second portion of the duodenum. It was noted on prior endoscopies,       unchanged compared to last endoscopy images Impression:               - Benign-appearing esophageal stenosis.                           - Portal hypertensive gastropathy.                           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                           - No specimens collected. Moderate Sedation:      N/A Recommendation:           - Patient has a contact number available for                            emergencies. The signs and symptoms of potential                            delayed complications  were discussed with the                            patient. Return to normal activities tomorrow.                            Written discharge instructions were provided to the                            patient.                           - Pureed diet.                           - Continue present medications.                           - Can DC octreotide and protonix gtt                           - Protonix 40 mg PO twice daily                           - Please call with any questions                           - Follow up with Dr Jena Gaussourk and repeat upper                            endoscopy as outpatient for possible dilation if                            needed Procedure Code(s):        --- Professional ---  16109, Esophagogastroduodenoscopy, flexible,                            transoral; diagnostic, including collection of                            specimen(s) by brushing or washing, when performed                            (separate procedure) Diagnosis Code(s):        --- Professional ---                           K22.2, Esophageal obstruction                           K76.6, Portal hypertension                           K31.89, Other diseases of stomach and duodenum                           K92.2, Gastrointestinal hemorrhage, unspecified CPT copyright 2016 [employer]. All rights reserved. The codes documented in this report are preliminary and upon coder review may  be revised to meet current compliance requirements. Napoleon Form, MD 08/22/2016 11:55:49 AM This report has been signed electronically. Number of Addenda: 0

## 2016-08-22 NOTE — Progress Notes (Signed)
Pt eating dinner inr up - plt down - hgb down Will plan for hip fx fixation once medically stable - labs pending for am Lactic acid has decreased

## 2016-08-22 NOTE — Progress Notes (Signed)
  Echocardiogram 2D Echocardiogram has been performed.  [name], [name] M 08/22/2016, 3:23 PM

## 2016-08-22 NOTE — Progress Notes (Signed)
PULMONARY / CRITICAL CARE MEDICINE   Name: [name] MRN: [id number] DOB: [birth year]    ADMISSION DATE:  08/21/2016 CONSULTATION DATE:  08/21/16  REFERRING MD:  Verdie MosherLiu - EDP  CHIEF COMPLAINT:  GI Bleed  BRIEF: [name] is a 60 y.o. female with PMH as outlined below. She presented to [facility] ED 12/11 after her son found her in her home, lying in her bed covered in feces.  She apparently had a mechanical fall 3 days prior and had been lying in bed since that time.   Imaging in ED revealed intertrochanteric hip fx.  She also had positive FOBT and reported that she had been experiencing hematemesis as well as melena.  She states hematemesis would occur several times per day and has been going on for a month or so.  Denies any hematochezia.  She has long standing hx of significant ETOH intake (she reports "three 1/2 gallons of liquor per day), but she states that she quit drinking a few weeks ago.  Due to GI bleed and hip fx, she was transferred [facility] evaluation.  She had endoscopy in Nov 2016 by Dr. 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      STUDIES and events CT head 12/11 > negative. Left hip 12/11 > left intertrochanteric hip fx. CXR 12/11 > negative. 12/11 > admitted with GI bleed and left hip fx. 12/11 - R IJ CVL 12/11 >    SUBJECTIVE/OVERNIGHT/INTERVAL HX 12/12  - wondering when she can go home. No active blee.d Not on vent. Not on pressors but bp on softer side Jaundiced. REports last etoh 10d ago and denies she will have withdrawal symptoms. Creat improinvg.  pulseox 91% on RA. On octgrerotide and ppi gtt   Had 2 BM yesterday - non-bloody  VITAL SIGNS: BP (!) 95/58 (BP Location: Left Arm)   Pulse (!) 111   Temp 97.7 F (36.5 C) (Oral)   Resp (!) 25   Wt 57.6 kg (126 lb 15.8 oz)   SpO2 97%   BMI 20.50 kg/m   HEMODYNAMICS: CVP:  [6 mmHg] 6 mmHg  VENTILATOR SETTINGS:     INTAKE / OUTPUT: I/O last 3 completed shifts: In: 6895.7 [I.V.:2305; Blood:550.7; IV Piggyback:4040] Out: 285 [Urine:285]   PHYSICAL EXAMINATION: General: Adult female, in NAD. Neuro: A&O x 3, non-focal.  HEENT: [place]/AT. PERRL, sclerael icterus. Cardiovascular: RRR, 3/6 SEM. Lungs: Respirations even and unlabored.  CTA bilaterally, No W/R/R. Abdomen: BS x 4, soft, NT/ND.  Musculoskeletal: left hip tenderness. Skin: Jaundiced, warm, no rashes.  LABS:  PULMONARY  Recent Labs Lab 08/21/16 1238  TCO2 16    CBC  Recent Labs Lab 08/21/16 1249 08/21/16 2130 08/22/16 0100  HGB 6.3* 8.6* 8.2*  HCT 21.1* 27.1* 25.5*  WBC 16.2*  --  14.7*  PLT 86*  --  46*    COAGULATION  Recent Labs Lab 08/21/16 1249  INR 3.24    CARDIAC   Recent Labs Lab 08/21/16 1256 08/21/16 2130 08/22/16 0405  TROPONINI 1.60* 1.66* 2.10*   No results for input(s): PROBNP in the last 168 hours.   CHEMISTRY  Recent Labs Lab 08/21/16 1238 08/21/16 1249 08/21/16 2130 08/22/16 0100 08/22/16 0640  NA 135 135 135 135 137  K 3.3* 3.0* 3.0* 3.1* 3.8  CL 90* 90* 99* 102 104  CO2  --  14* 21* 25 23  GLUCOSE 147* 140* 119* 104* 102*  BUN 14 15 14 14 14   CREATININE 2.20* 2.00* 1.93* 1.84* 1.70*  CALCIUM  --  8.0* 7.1* 6.9* 6.8*  MG  --   --   --  1.4* 2.1  PHOS  --   --   --  2.4*  --    Estimated Creatinine Clearance: 32.8 mL/min (by C-G formula based on SCr of 1.7 mg/dL (H)).   LIVER  Recent Labs Lab 08/21/16 1249 08/22/16 0405  AST 221* 205*  ALT 54 42  ALKPHOS 92 61  BILITOT 8.1* 6.9*  PROT 6.7 5.9*  ALBUMIN 2.1* 1.8*  INR 3.24  --      INFECTIOUS  Recent Labs Lab 08/21/16 1520 08/21/16 2130 08/22/16 0100  LATICACIDVEN 15.0* 8.4* 4.7*  PROCALCITON  --  5.27  --      ENDOCRINE CBG (last 3)   Recent Labs  08/21/16 1203 08/21/16 1750 08/21/16 2031  GLUCAP 129* 132* 141*         IMAGING x48h  - image(s) personally visualized  -   highlighted  in bold Dg Chest 1 View  Result Date: 08/21/2016 CLINICAL DATA:  Fall, left hip pain EXAM: CHEST 1 VIEW COMPARISON:  06/25/2015 FINDINGS: Cardiomediastinal silhouette is stable. No infiltrate or pleural effusion. No pulmonary edema. Minimal thoracic dextroscoliosis. IMPRESSION: No active disease. Electronically Signed   By: Natasha MeadLiviu  Pop M.D.   On: 08/21/2016 13:54   Ct Head Wo Contrast  Result Date: 08/21/2016 CLINICAL DATA:  Fall and altered mental status EXAM: CT HEAD WITHOUT CONTRAST CT CERVICAL SPINE WITHOUT CONTRAST TECHNIQUE: Multidetector CT imaging of the head and cervical spine was performed following the standard protocol without intravenous contrast. Multiplanar CT image reconstructions of the cervical spine were also generated. COMPARISON:  Head CT 05/25/2015 and 03/24/2010. FINDINGS: CT HEAD FINDINGS Brain: No mass lesion, intraparenchymal hemorrhage or extra-axial collection. No evidence of acute cortical infarct. There is encephalomalacia within the left frontal lobe at the site of prior MCA infarct. There is mildly age advanced volume loss. No hydrocephalus. Vascular: No hyperdense vessel or unexpected calcification. Skull: Normal visualized skull base, calvarium and extracranial soft tissues. Sinuses/Orbits: No sinus fluid levels or advanced mucosal thickening. No mastoid effusion. Normal orbits. CT CERVICAL SPINE FINDINGS Alignment: No static subluxation. Facets are aligned. Occipital condyles are normally positioned. Reversal of the normal cervical lordosis. Minimal C5-C6 anterolisthesis slightly increased relative to 03/24/2010, possibly positional. Skull base and vertebrae: No acute fracture. Soft tissues and spinal canal: No prevertebral fluid or swelling. No visible canal hematoma. Disc levels: No advanced spinal canal or neural foraminal stenosis. Upper chest: No pneumothorax, pulmonary nodule or pleural effusion. Other: Normal visualized paraspinal cervical soft tissues. IMPRESSION:  1. No acute intracranial abnormality. 2. No acute fracture of the cervical spine. 3. 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 Electronically Signed   By: Deatra RobinsonKevin  Herman M.D.   On: 08/21/2016 14:40   Ct Cervical Spine Wo Contrast  Result Date: 08/21/2016 CLINICAL DATA:  Fall and altered mental status EXAM: CT HEAD WITHOUT CONTRAST CT CERVICAL SPINE WITHOUT CONTRAST TECHNIQUE: Multidetector CT imaging of the head and cervical spine was performed following the standard protocol without intravenous contrast. Multiplanar CT image reconstructions of the cervical spine were also generated. COMPARISON:  Head CT 05/25/2015 and 03/24/2010. FINDINGS: CT HEAD FINDINGS Brain: No mass lesion, intraparenchymal hemorrhage or extra-axial collection. No evidence of acute cortical infarct. There is encephalomalacia within the left frontal lobe at the site of prior MCA infarct. There  is mildly age advanced volume loss. No hydrocephalus. Vascular: No hyperdense vessel or unexpected calcification. Skull: Normal visualized skull base, calvarium and extracranial soft tissues. Sinuses/Orbits: No sinus fluid levels or advanced mucosal thickening. No mastoid effusion. Normal orbits. CT CERVICAL SPINE FINDINGS Alignment: No static subluxation. Facets are aligned. Occipital condyles are normally positioned. Reversal of the normal cervical lordosis. Minimal C5-C6 anterolisthesis slightly increased relative to 03/24/2010, possibly positional. Skull base and vertebrae: No acute fracture. Soft tissues and spinal canal: No prevertebral fluid or swelling. No visible canal hematoma. Disc levels: No advanced spinal canal or neural foraminal stenosis. Upper chest: No pneumothorax, pulmonary nodule or pleural effusion. Other: Normal visualized paraspinal cervical soft tissues. IMPRESSION: 1. No acute intracranial abnormality. 2. No acute fracture of the cervical spine. 3. Left frontal encephalomalacia at the site of  old MCA distribution infarct. Electronically Signed   By: Deatra Robinson M.D.   On: 08/21/2016 14:40   Dg Chest Port 1 View  Result Date: 08/22/2016 CLINICAL DATA:  Shortness of breath.  Respiratory failure. EXAM: PORTABLE CHEST 1 VIEW COMPARISON:  Single-view of the chest 08/21/2016. PA and lateral chest 06/25/2015. FINDINGS: Right IJ catheter is again seen with the tip projecting near the superior cavoatrial junction. Patchy airspace opacity is seen throughout the left chest the right lung is clear. No pneumothorax or pleural effusion. Heart size is upper normal. IMPRESSION: Patchy airspace opacity throughout the left chest worrisome for pneumonia. Electronically Signed   By: Drusilla Kanner M.D.   On: 08/22/2016 07:44   Dg Chest Portable 1 View  Result Date: 08/21/2016 CLINICAL DATA:  Central line placement. EXAM: PORTABLE CHEST 1 VIEW COMPARISON:  Radiograph of same day. FINDINGS: The heart size and mediastinal contours are within normal limits. Both lungs are clear. The visualized skeletal structures are unremarkable. Interval placement of right internal jugular catheter with distal tip in expected position of cavoatrial junction. No pneumothorax or pleural effusion is noted. IMPRESSION: Interval placement of right internal jugular catheter with distal tip in expected position of cavoatrial junction. No pneumothorax is noted. Electronically Signed   By: Lupita Raider, M.D.   On: 08/21/2016 15:01   Dg Hip Unilat W Or Wo Pelvis 2-3 Views Left  Result Date: 08/21/2016 CLINICAL DATA:  Left hip pain since a fall 08/18/2016. Patient found down 3 days later. EXAM: DG HIP (WITH OR WITHOUT PELVIS) 2-3V LEFT COMPARISON:  None. FINDINGS: Acute left intertrochanteric fracture is identified. Left femoral head is located. No other acute abnormality. IMPRESSION: Acute left intertrochanteric fracture. Electronically Signed   By: Drusilla Kanner M.D.   On: 08/21/2016 13:54       DISCUSSION: 60 y.o.  female with PMH of cirrhosis, GI bleed, ETOH abuse, admitted 12/11 with recurrent GI bleed as well as acute left intertrochanteric hip fx following mechanical fall 3 days prior.  ASSESSMENT / PLAN:  GASTROINTESTINAL A:   GI bleed - unclear etiology, concern for varices given significant EtOH abuse (though EGD in 2016 did not show varices). Hx hepatic steatosis, GERD, cholelithiasis. Transaminitis - AST > ALT c/w ETOH abuse. Nutrition.  - no active bleed but on ppi and octreotide gtt.  P:   GI Jennye Moccasin of GI callled 08/22/16 AM  Continue octreotide gtt, PPI gtt. NPO.  HEMATOLOGIC A:   Anemia - presumed due to acute blood loss - s/p 2u PRBC, 1u FFP. Coagulopathy - presumed due to ETOH liver disease. Hx thrombocytopenia - presumed due to bone marrow suppression from EtOH abuse. VTE  Prophylaxis.   - no active bleed. S/p 2U prbc and 1 U FFP  P:  [place] 1 unit Vit K 5mg  x 1 H/H q12hrs and then daily - repeat at 3pm ; Transfuse for Hgb < 7. SCD's only.  PULMONARY A: LLL pneumonia needing 1-2L [place] - acute hypoxemic resp failure P:   Keep head of bed elevated in case hematemesis recurs. If has recurrence, may need intubation prior to EGD. IS  CARDIOVASCULAR A:  Hx HTN.   - not on vasopressors. Trop leak +.  P:  Dc levophed,  Goal MAP > 65. Get echo .  RENAL A:   AKI due to volume loss  - improved creat   P:   NS @ 50. Repeat BMP this evening. dialy lyte check  INFECTIOUS A:   Likely LLL pna   P:   unasyn 08/22/16 >>   ENDOCRINE A:   No acute issues.   P:   Monitor glucose on BMP.  NEUROLOGIC A:   Hx CVA, ETOH abuse. P:   Thiamine, folate. ETOH cessation counseling.  MUSCULOSKELETAL A: Left hip intertrochanteric fx. P: Ortho consulted by EDP, agreed to see.  Appreciate the assistance.  Family updated: None available. Patient updated 08/22/2016 . Move to sDU, trh primary and pccm off  Interdisciplinary Family Meeting v Palliative  Care Meeting:  Due by: 12/17.  .   Dr. Kalman Shan, M.D., F.C.C.P Pulmonary and Critical Care Medicine Staff Physician [facility] Pager: [phone], If no answer or between  15:00h - 7:00h: call [phone]  08/22/2016 8:48 AM

## 2016-08-22 NOTE — Anesthesia Procedure Notes (Signed)
Procedure Name: MAC Date/Time: 08/22/2016 11:10 AM Performed by: Edmonia CaprioAUSTON, Bennye Nix M Pre-anesthesia Checklist: Patient identified, Emergency Drugs available, Patient being monitored, Timeout performed and Suction available Patient Re-evaluated:Patient Re-evaluated prior to inductionOxygen Delivery Method: Nasal cannula

## 2016-08-22 NOTE — H&P (View-Only) (Signed)
`                                                                                                                                                                         [facility] Consult: 9:05 AM 08/22/2016  LOS: 1 day    Referring Provider: Dr Chase Caller of [facility]  Primary Care Physician:  Dionisio Paschal, NP Primary Gastroenterologist:  Dr Sydell Axon     Reason for Consultation:  Anemia, FOBT positive stool in a cirrhotic.   HPI: [name] is a 60 y.o. female.  PMH CVA.  LE cellulitis.  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  Multifactorial encephalopathy 05/2015 (etoh withdrawal, ? etoh dementia, ammonia level normal).   Aspiration PNA 05/2015. Left renal lesion on CT   Chronic anemia.  Chronic GI bleeding.  GERD.  Thrombocytopenia.   07/2015 EGD for variceal screening.  No varices or portal gastropathy.  Distal esophageal peptic stricture, dilated with passage of scope. No Barretts. Esophageal path: candida esophagitis.   HH.  2.5 cm submucosal duodenal mass (path: chronic inactive gastritis)  05/2015 CT abdomen and pelvis without contrast  Hepatomegaly, fatty liver. Subcentimeter right hepatic hypodense lesions not characterized. Gallbladder filled with stones. Grossly stable left renal inferior pole cyst. 1.3 x 1.2 cm hypoattenuating lesions arising from the inferior pole of the left kidney and not characterized on noncontrast CT. Follow-up ultrasound or MRI recommended. Mild bilateral perinephric stranding. Sigmoid diverticulosis with muscular hypertrophy, submucosal fat deposit in the cecum and ascending colon likely sequelae of chronic inflammation, compression fracture of L1 vertebra with extension of the fracture through superior and inferior endplates. 03/2015 Abdominal ultrasound: gallstones, fatty liver,cystic mass arising from lower pole of left kidney measuring 2.9 x 2.9 x 3.9 cm also on prior CT. Small solid-appearing mass arising from the lower  pole of the left kidney seen on prior CT is not appreciable on the ultrasound today.CT or MR pre-and post IV contrast recommended.  Pt discovered by son in bed, laying in stool.  Had fallen on ~ 08/18/2016 and been in bed since.  Her husband was at home with her and was apparently keeping an eye on her. Discovered in ED to have acute hip fx.   Pt consumption of Vodka consists of 1.5 liters over 48 hours.  She quit drinking 3 weeks ago and denies any problems with withdrawal symptoms.   She had elevated ETOH level during admission in 04/2016.  For at least 3 or 4 weeks her stools have been loose sometimes very dark in color, not grossly bloody however. Sometimes they are brown. She denies nausea and vomiting. She has had no oral bleeding when she tries to brush her teeth.  Does  use ibuprofen rarely.   Hgb low of 6.3 at arrival.  Was 11.2 on 05/01/16.  Platelets then of 29, currently 46 post platelet transfusion. Hgb up to 8.2 after PRBC x 2.   MCV is an astounding 133.5.   coags 33/3.2 treated with FFP x 1.   FOBT +. T bil 8.1 >> 6.9.  Alk phos 92 >> 61.  AST/ALT 221/54 >> 205/42.   Lactic acid levels has been markedly elevated but are improving. CXR with likely PNA.  Nothing acute on head CT.    Therapies include Unasyn, IV Protonix drip, octreotide drip. Is now off Levophed.   Past Medical History:  Diagnosis Date  . Alcohol abuse    quit 05/23/15  . Alcoholic cirrhosis ([place])    05/2015  . 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 patient reports mental status clear, possible had change related to DTs  . Anemia of chronic disease   . Cholelithiasis   . Chronic GI bleeding   . Coagulopathy ([place])   . Compression fracture    L1, fall in 05/2015  . Compression fracture of lumbar vertebra ([place])   . GERD (gastroesophageal reflux disease)   . Hepatic steatosis   . Hypertension   . Normal cardiac stress test 2008   myoview  . Stroke [facility]) 2008   left sided weakness  . Thrombocytopenia ([place])       Past Surgical History:  Procedure Laterality Date  . APPENDECTOMY    . BIOPSY N/A 07/26/2015   Procedure: BIOPSY;  Surgeon: Daneil Dolin, MD;  Location: [facility];  Service: Endoscopy;  Laterality: N/A;  Gastric, Esophageal  . COLONOSCOPY     ???  . ESOPHAGOGASTRODUODENOSCOPY  04/2007   RMR: tight peptic stricture status post dilation, antral erosions, superficial ulcerations, submucosal duodenal mass, gastric biopsy positive for H. pylori, duodenal biopsy benign, completed H. pylori treatment  . ESOPHAGOGASTRODUODENOSCOPY  07/2007   RMR: peptic stricture status post dilation, gastric erosions, 2 cm submucosal duodenal mass. Biopsies negative.referred for endoscopic ultrasound of submucosal duodenal mass but unclear whether this was completed.  . ESOPHAGOGASTRODUODENOSCOPY (EGD) WITH PROPOFOL N/A 07/26/2015   RMR: Abnoramal esophagus/short distal peptic stricture-status post dilation with scope passage- status post biopsy. No varices. Hiatal hernia. Abnormal stomach-status post biopsy. duodenal mass of uncertain significance.     Prior to Admission medications   Medication Sig Start Date End Date Taking? Authorizing Provider  acetaminophen-codeine (TYLENOL #3) 300-30 MG tablet Take 1 tablet by mouth every 6 (six) hours as needed for moderate pain.    Historical Provider, MD  albuterol (PROVENTIL HFA;VENTOLIN HFA) 108 (90 BASE) MCG/ACT inhaler Inhale 1-2 puffs into the lungs every 6 (six) hours as needed for wheezing. 06/02/15 01/31/19  Radene Gunning, NP    Scheduled Meds: . sodium chloride   Intravenous Once  . folic acid  1 mg Intravenous Daily  . [START ON 08/25/2016] pantoprazole  40 mg Intravenous Q12H  . potassium chloride (KCL MULTIRUN) 30 mEq in 265 mL IVPB  30 mEq Intravenous Q4H  . thiamine  100 mg Intravenous Daily   Infusions: . sodium chloride 125 mL/hr at 08/21/16 2002  . octreotide  (SANDOSTATIN)    IV infusion 50 mcg/hr (08/21/16 2314)  . pantoprozole (PROTONIX)  infusion 8 mg/hr (08/22/16 0108)   PRN Meds: sodium chloride   Allergies as of 08/21/2016 - Review Complete 08/21/2016  Allergen Reaction Noted  . Loratadine Rash 03/21/2015    Family History  Problem Relation Age of Onset  .  Leukemia Mother   . Cirrhosis Paternal Uncle     etoh  . Colon cancer Neg Hx     Social History   Social History  . Marital status: Married    Spouse name: N/A  . Number of children: 2  . Years of education: N/A   Occupational History  . Not on file.   Social History Main Topics  . Smoking status: Current Every Day Smoker    Packs/day: 1.50    Years: 40.00    Types: Cigarettes  . Smokeless tobacco: Never Used  . Alcohol use 12.0 oz/week    20 Shots of liquor per week     Comment: quit etoh 05/23/15. used to drink 1/2 gallon vodka in two days   . Drug use: No  . Sexual activity: Not on file   Other Topics Concern  . Not on file   Social History Narrative  . No narrative on file    REVIEW OF SYSTEMS: Constitutional:  Weakness. ENT:  No nose bleeeds Pulm:  No shortness of breath. Occasional, mostly nonproductive cough. CV:  No palpitations, no LE edema. No chest pain GU:  No hematuria, no frequency GI:  No dysphagia. See history of present illness. Heme:  See HPI   Transfusions:  See HPI Neuro:  No headaches, no peripheral tingling or numbness Derm:  No itching, no rash or sores.  Endocrine:  No sweats or chills.  No polyuria or dysuria Immunization:  She got the flu and Pneumovax shot in 2016. I don't see records of her having received hepatitis A or B immunization though it was mentioned in the last GI office visit with Dr. Verlene Mayer in 2016. Travel:  None beyond local counties in last few months.    PHYSICAL EXAM: Vital signs in last 24 hours: Vitals:   08/22/16 0800 08/22/16 0821  BP: (!) 95/58   Pulse: (!) 111   Resp: (!) 25   Temp:  97.7 F (36.5 C)   Wt Readings from Last 3 Encounters:  08/22/16 57.6 kg (126 lb 15.8  oz)  04/27/16 57.6 kg (127 lb)  04/25/16 57.6 kg (127 lb)    General: Chronically and acutely unwell looking, malnourished appearing WF. She is alert. Head:  No signs of head trauma. No asymmetry.  Eyes:  Mild scleral icterus. Conjunctiva pale. Ears:  Not HOH.  Nose:  No congestion or discharge Mouth:  Poor dentition. Dry and pale oral mucous membranes. No visible fresh or dried blood.  No lesions. Neck:  No JVD, no masses, no TMG. Lungs:  Clear bilaterally. No cough. Heart: RRR. No MRG. S1, S2 present. Abdomen:  Not tender or distended. No masses. No HSM. Bowel sounds active..   Rectal: Patient incontinent of brown, liquid stool. There is no blush of any blood with this in the bedding.   Musc/Skeltl: No bruising yet but swelling in the left thigh and hip area. Extremities:  Swelling in the left thigh and hip. No pitting edema but generally has muscle wasting and looks to be protein malnourished.  Neurologic:  Patient is alert. She is oriented 3. She answers appropriately. She moves her limbs though not the left leg because of pain. Skin:  Some telangiectasia on the upper chest. Tattoos:  None Nodes:  No cervical adenopathy.   Psych:  Cooperative, affect somewhat blunted. Calm.  Intake/Output from previous day: 12/11 0701 - 12/12 0700 In: 6895.7 [I.V.:2305; Blood:550.7; IV Piggyback:4040] Out: 285 [Urine:285] Intake/Output this shift: Total I/O In: 175 [I.V.:175]  Out: -   LAB RESULTS:  Recent Labs  08/21/16 1249 08/21/16 2130 08/22/16 0100  WBC 16.2*  --  14.7*  HGB 6.3* 8.6* 8.2*  HCT 21.1* 27.1* 25.5*  PLT 86*  --  46*   BMET Lab Results  Component Value Date   NA 137 08/22/2016   NA 135 08/22/2016   NA 135 08/21/2016   K 3.8 08/22/2016   K 3.1 (L) 08/22/2016   K 3.0 (L) 08/21/2016   CL 104 08/22/2016   CL 102 08/22/2016   CL 99 (L) 08/21/2016   CO2 23 08/22/2016   CO2 25 08/22/2016   CO2 21 (L) 08/21/2016   GLUCOSE 102 (H) 08/22/2016   GLUCOSE 104 (H)  08/22/2016   GLUCOSE 119 (H) 08/21/2016   BUN 14 08/22/2016   BUN 14 08/22/2016   BUN 14 08/21/2016   CREATININE 1.70 (H) 08/22/2016   CREATININE 1.84 (H) 08/22/2016   CREATININE 1.93 (H) 08/21/2016   CALCIUM 6.8 (L) 08/22/2016   CALCIUM 6.9 (L) 08/22/2016   CALCIUM 7.1 (L) 08/21/2016   LFT  Recent Labs  08/21/16 1249 08/22/16 0405  PROT 6.7 5.9*  ALBUMIN 2.1* 1.8*  AST 221* 205*  ALT 54 42  ALKPHOS 92 61  BILITOT 8.1* 6.9*  BILIDIR  --  3.7*  IBILI  --  3.2*   PT/INR Lab Results  Component Value Date   INR 3.24 08/21/2016   INR 1.20 04/27/2016   INR 1.31 07/15/2015   Hepatitis Panel No results for input(s): HEPBSAG, HCVAB, HEPAIGM, HEPBIGM in the last 72 hours. C-Diff No components found for: CDIFF Lipase     Component Value Date/Time   LIPASE 56 (H) 08/21/2016 1249    Drugs of Abuse     Component Value Date/Time   LABOPIA NONE DETECTED 05/26/2015 0430   COCAINSCRNUR NONE DETECTED 05/26/2015 0430   LABBENZ POSITIVE (A) 05/26/2015 0430   AMPHETMU NONE DETECTED 05/26/2015 0430   THCU NONE DETECTED 05/26/2015 0430   LABBARB NONE DETECTED 05/26/2015 0430     RADIOLOGY STUDIES: Dg Chest 1 View  Result Date: 08/21/2016 CLINICAL DATA:  Fall, left hip pain EXAM: CHEST 1 VIEW COMPARISON:  06/25/2015 FINDINGS: Cardiomediastinal silhouette is stable. No infiltrate or pleural effusion. No pulmonary edema. Minimal thoracic dextroscoliosis. IMPRESSION: No active disease. Electronically Signed   By: Natasha Mead M.D.   On: 08/21/2016 13:54   Ct Head Wo Contrast  Result Date: 08/21/2016 CLINICAL DATA:  Fall and altered mental status EXAM: CT HEAD WITHOUT CONTRAST CT CERVICAL SPINE WITHOUT CONTRAST TECHNIQUE: Multidetector CT imaging of the head and cervical spine was performed following the standard protocol without intravenous contrast. Multiplanar CT image reconstructions of the cervical spine were also generated. COMPARISON:  Head CT 05/25/2015 and 03/24/2010.  FINDINGS: CT HEAD FINDINGS Brain: No mass lesion, intraparenchymal hemorrhage or extra-axial collection. No evidence of acute cortical infarct. There is encephalomalacia within the left frontal lobe at the site of prior MCA infarct. There is mildly age advanced volume loss. No hydrocephalus. Vascular: No hyperdense vessel or unexpected calcification. Skull: Normal visualized skull base, calvarium and extracranial soft tissues. Sinuses/Orbits: No sinus fluid levels or advanced mucosal thickening. No mastoid effusion. Normal orbits. CT CERVICAL SPINE FINDINGS Alignment: No static subluxation. Facets are aligned. Occipital condyles are normally positioned. Reversal of the normal cervical lordosis. Minimal C5-C6 anterolisthesis slightly increased relative to 03/24/2010, possibly positional. Skull base and vertebrae: No acute fracture. Soft tissues and spinal canal: No prevertebral fluid or swelling. No visible  canal hematoma. Disc levels: No advanced spinal canal or neural foraminal stenosis. Upper chest: No pneumothorax, pulmonary nodule or pleural effusion. Other: Normal visualized paraspinal cervical soft tissues. IMPRESSION: 1. No acute intracranial abnormality. 2. No acute fracture of the cervical spine. 3. 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 Electronically Signed   By: Ulyses Jarred M.D.   On: 08/21/2016 14:40   Ct Cervical Spine Wo Contrast  Result Date: 08/21/2016 CLINICAL DATA:  Fall and altered mental status EXAM: CT HEAD WITHOUT CONTRAST CT CERVICAL SPINE WITHOUT CONTRAST TECHNIQUE: Multidetector CT imaging of the head and cervical spine was performed following the standard protocol without intravenous contrast. Multiplanar CT image reconstructions of the cervical spine were also generated. COMPARISON:  Head CT 05/25/2015 and 03/24/2010. FINDINGS: CT HEAD FINDINGS Brain: No mass lesion, intraparenchymal hemorrhage or extra-axial collection. No evidence of acute  cortical infarct. There is encephalomalacia within the left frontal lobe at the site of prior MCA infarct. There is mildly age advanced volume loss. No hydrocephalus. Vascular: No hyperdense vessel or unexpected calcification. Skull: Normal visualized skull base, calvarium and extracranial soft tissues. Sinuses/Orbits: No sinus fluid levels or advanced mucosal thickening. No mastoid effusion. Normal orbits. CT CERVICAL SPINE FINDINGS Alignment: No static subluxation. Facets are aligned. Occipital condyles are normally positioned. Reversal of the normal cervical lordosis. Minimal C5-C6 anterolisthesis slightly increased relative to 03/24/2010, possibly positional. Skull base and vertebrae: No acute fracture. Soft tissues and spinal canal: No prevertebral fluid or swelling. No visible canal hematoma. Disc levels: No advanced spinal canal or neural foraminal stenosis. Upper chest: No pneumothorax, pulmonary nodule or pleural effusion. Other: Normal visualized paraspinal cervical soft tissues. IMPRESSION: 1. No acute intracranial abnormality. 2. No acute fracture of the cervical spine. 3. 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 Electronically Signed   By: Ulyses Jarred M.D.   On: 08/21/2016 14:40   Dg Chest Port 1 View  Result Date: 08/22/2016 CLINICAL DATA:  Shortness of breath.  Respiratory failure. EXAM: PORTABLE CHEST 1 VIEW COMPARISON:  Single-view of the chest 08/21/2016. PA and lateral chest 06/25/2015. FINDINGS: Right IJ catheter is again seen with the tip projecting near the superior cavoatrial junction. Patchy airspace opacity is seen throughout the left chest the right lung is clear. No pneumothorax or pleural effusion. Heart size is upper normal. IMPRESSION: Patchy airspace opacity throughout the left chest worrisome for pneumonia. Electronically Signed   By: Inge Rise M.D.   On: 08/22/2016 07:44   Dg Chest Portable 1 View  Result Date: 08/21/2016 CLINICAL  DATA:  Central line placement. EXAM: PORTABLE CHEST 1 VIEW COMPARISON:  Radiograph of same day. FINDINGS: The heart size and mediastinal contours are within normal limits. Both lungs are clear. The visualized skeletal structures are unremarkable. Interval placement of right internal jugular catheter with distal tip in expected position of cavoatrial junction. No pneumothorax or pleural effusion is noted. IMPRESSION: Interval placement of right internal jugular catheter with distal tip in expected position of cavoatrial junction. No pneumothorax is noted. Electronically Signed   By: Marijo Conception, M.D.   On: 08/21/2016 15:01   Dg Hip Unilat W Or Wo Pelvis 2-3 Views Left  Result Date: 08/21/2016 CLINICAL DATA:  Left hip pain since a fall 08/18/2016. Patient found down 3 days later. EXAM: DG HIP (WITH OR WITHOUT PELVIS) 2-3V LEFT COMPARISON:  None. FINDINGS: Acute left intertrochanteric fracture is identified. Left femoral head is located. No other acute abnormality. IMPRESSION:  Acute left intertrochanteric fracture. Electronically Signed   By: Inge Rise M.D.   On: 08/21/2016 13:54    IMPRESSION:   *  GI bleed.  Hx GERD and peptic esoph stricture but no varices or portal gastropathy on EGD 07/2015.   *  Cirrhosis and ETOH hepatitis.  *  Coagulopathy.  S/p FFP  *  Thrombocytopenia.  S/p platelets.  *  Macrocytic anemia.  S/p PRBCs.   *  AKI.    *  Left hip  Fx.  or arthritis consult pending.  PLAN:     *   Plan EGD today. Time to be worked out.   Azucena Freed  08/22/2016, 9:05 AM Pager: [phone]

## 2016-08-22 NOTE — Anesthesia Preprocedure Evaluation (Addendum)
Anesthesia Evaluation  Patient identified by MRN, date of birth, ID band Patient awake    Reviewed: Allergy & Precautions, NPO status , Patient's Chart, lab work & pertinent test results  Airway Mallampati: II  TM Distance: >3 FB Neck ROM: Full    Dental no notable dental hx. (+) Poor Dentition, Loose, Dental Advisory Given   Pulmonary Current Smoker,    Pulmonary exam normal breath sounds clear to auscultation       Cardiovascular hypertension, Normal cardiovascular exam Rhythm:Regular Rate:Normal     Neuro/Psych CVA, Residual Symptoms negative psych ROS   GI/Hepatic negative GI ROS, (+) Cirrhosis     substance abuse  alcohol use, Alcoholic   Endo/Other  negative endocrine ROS  Renal/GU Renal InsufficiencyRenal disease  negative genitourinary   Musculoskeletal negative musculoskeletal ROS (+)   Abdominal   Peds negative pediatric ROS (+)  Hematology  (+) anemia , Plt 46K   Anesthesia Other Findings Acute left hip fracture  Reproductive/Obstetrics negative OB ROS                          Anesthesia Physical Anesthesia Plan  ASA: IV  Anesthesia Plan: MAC   Post-op Pain Management:    Induction: Intravenous  Airway Management Planned: Nasal Cannula  Additional Equipment:   Intra-op Plan:   Post-operative Plan:   Informed Consent: I have reviewed the patients History and Physical, chart, labs and discussed the procedure including the risks, benefits and alternatives for the proposed anesthesia with the patient or authorized representative who has indicated his/her understanding and acceptance.   Dental advisory given  Plan Discussed with: CRNA and Surgeon  Anesthesia Plan Comments:        Anesthesia Quick Evaluation

## 2016-08-22 NOTE — Progress Notes (Signed)
eLink Physician-Brief Progress Note Patient Name: Debbie ElseDeborah C Valentine DOB: 10-27-1955 MRN: [id number]   Date of Service  08/22/2016  HPI/Events of Note    eICU Interventions  Electrolytes replaced     Intervention Category Minor Interventions: Electrolytes abnormality - evaluation and management  Addylin Manke S. 08/22/2016, 2:47 AM

## 2016-08-22 NOTE — Consult Note (Signed)
`                                                                                                                                                                         [facility] Consult: 9:05 AM 08/22/2016  LOS: 1 day    Referring Provider: Dr Chase Caller of [facility]  Primary Care Physician:  Dionisio Paschal, NP Primary Gastroenterologist:  Dr Sydell Axon     Reason for Consultation:  Anemia, FOBT positive stool in a cirrhotic.   HPI: [name] is a 60 y.o. female.  PMH CVA.  LE cellulitis.  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  Multifactorial encephalopathy 05/2015 (etoh withdrawal, ? etoh dementia, ammonia level normal).   Aspiration PNA 05/2015. Left renal lesion on CT   Chronic anemia.  Chronic GI bleeding.  GERD.  Thrombocytopenia.   07/2015 EGD for variceal screening.  No varices or portal gastropathy.  Distal esophageal peptic stricture, dilated with passage of scope. No Barretts. Esophageal path: candida esophagitis.   HH.  2.5 cm submucosal duodenal mass (path: chronic inactive gastritis)  05/2015 CT abdomen and pelvis without contrast  Hepatomegaly, fatty liver. Subcentimeter right hepatic hypodense lesions not characterized. Gallbladder filled with stones. Grossly stable left renal inferior pole cyst. 1.3 x 1.2 cm hypoattenuating lesions arising from the inferior pole of the left kidney and not characterized on noncontrast CT. Follow-up ultrasound or MRI recommended. Mild bilateral perinephric stranding. Sigmoid diverticulosis with muscular hypertrophy, submucosal fat deposit in the cecum and ascending colon likely sequelae of chronic inflammation, compression fracture of L1 vertebra with extension of the fracture through superior and inferior endplates. 03/2015 Abdominal ultrasound: gallstones, fatty liver,cystic mass arising from lower pole of left kidney measuring 2.9 x 2.9 x 3.9 cm also on prior CT. Small solid-appearing mass arising from the lower  pole of the left kidney seen on prior CT is not appreciable on the ultrasound today.CT or MR pre-and post IV contrast recommended.  Pt discovered by son in bed, laying in stool.  Had fallen on ~ 08/18/2016 and been in bed since.  Her husband was at home with her and was apparently keeping an eye on her. Discovered in ED to have acute hip fx.   Pt consumption of Vodka consists of 1.5 liters over 48 hours.  She quit drinking 3 weeks ago and denies any problems with withdrawal symptoms.   She had elevated ETOH level during admission in 04/2016.  For at least 3 or 4 weeks her stools have been loose sometimes very dark in color, not grossly bloody however. Sometimes they are brown. She denies nausea and vomiting. She has had no oral bleeding when she tries to brush her teeth.  Does  use ibuprofen rarely.   Hgb low of 6.3 at arrival.  Was 11.2 on 05/01/16.  Platelets then of 29, currently 46 post platelet transfusion. Hgb up to 8.2 after PRBC x 2.   MCV is an astounding 133.5.   coags 33/3.2 treated with FFP x 1.   FOBT +. T bil 8.1 >> 6.9.  Alk phos 92 >> 61.  AST/ALT 221/54 >> 205/42.   Lactic acid levels has been markedly elevated but are improving. CXR with likely PNA.  Nothing acute on head CT.    Therapies include Unasyn, IV Protonix drip, octreotide drip. Is now off Levophed.   Past Medical History:  Diagnosis Date  . Alcohol abuse    quit 05/23/15  . Alcoholic cirrhosis ([place])    05/2015  . 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 patient reports mental status clear, possible had change related to DTs  . Anemia of chronic disease   . Cholelithiasis   . Chronic GI bleeding   . Coagulopathy ([place])   . Compression fracture    L1, fall in 05/2015  . Compression fracture of lumbar vertebra ([place])   . GERD (gastroesophageal reflux disease)   . Hepatic steatosis   . Hypertension   . Normal cardiac stress test 2008   myoview  . Stroke [facility]) 2008   left sided weakness  . Thrombocytopenia ([place])       Past Surgical History:  Procedure Laterality Date  . APPENDECTOMY    . BIOPSY N/A 07/26/2015   Procedure: BIOPSY;  Surgeon: Daneil Dolin, MD;  Location: [facility];  Service: Endoscopy;  Laterality: N/A;  Gastric, Esophageal  . COLONOSCOPY     ???  . ESOPHAGOGASTRODUODENOSCOPY  04/2007   RMR: tight peptic stricture status post dilation, antral erosions, superficial ulcerations, submucosal duodenal mass, gastric biopsy positive for H. pylori, duodenal biopsy benign, completed H. pylori treatment  . ESOPHAGOGASTRODUODENOSCOPY  07/2007   RMR: peptic stricture status post dilation, gastric erosions, 2 cm submucosal duodenal mass. Biopsies negative.referred for endoscopic ultrasound of submucosal duodenal mass but unclear whether this was completed.  . ESOPHAGOGASTRODUODENOSCOPY (EGD) WITH PROPOFOL N/A 07/26/2015   RMR: Abnoramal esophagus/short distal peptic stricture-status post dilation with scope passage- status post biopsy. No varices. Hiatal hernia. Abnormal stomach-status post biopsy. duodenal mass of uncertain significance.     Prior to Admission medications   Medication Sig Start Date End Date Taking? Authorizing Provider  acetaminophen-codeine (TYLENOL #3) 300-30 MG tablet Take 1 tablet by mouth every 6 (six) hours as needed for moderate pain.    Historical Provider, MD  albuterol (PROVENTIL HFA;VENTOLIN HFA) 108 (90 BASE) MCG/ACT inhaler Inhale 1-2 puffs into the lungs every 6 (six) hours as needed for wheezing. 06/02/15 01/31/19  Radene Gunning, NP    Scheduled Meds: . sodium chloride   Intravenous Once  . folic acid  1 mg Intravenous Daily  . [START ON 08/25/2016] pantoprazole  40 mg Intravenous Q12H  . potassium chloride (KCL MULTIRUN) 30 mEq in 265 mL IVPB  30 mEq Intravenous Q4H  . thiamine  100 mg Intravenous Daily   Infusions: . sodium chloride 125 mL/hr at 08/21/16 2002  . octreotide  (SANDOSTATIN)    IV infusion 50 mcg/hr (08/21/16 2314)  . pantoprozole (PROTONIX)  infusion 8 mg/hr (08/22/16 0108)   PRN Meds: sodium chloride   Allergies as of 08/21/2016 - Review Complete 08/21/2016  Allergen Reaction Noted  . Loratadine Rash 03/21/2015    Family History  Problem Relation Age of Onset  .  Leukemia Mother   . Cirrhosis Paternal Uncle     etoh  . Colon cancer Neg Hx     Social History   Social History  . Marital status: Married    Spouse name: N/A  . Number of children: 2  . Years of education: N/A   Occupational History  . Not on file.   Social History Main Topics  . Smoking status: Current Every Day Smoker    Packs/day: 1.50    Years: 40.00    Types: Cigarettes  . Smokeless tobacco: Never Used  . Alcohol use 12.0 oz/week    20 Shots of liquor per week     Comment: quit etoh 05/23/15. used to drink 1/2 gallon vodka in two days   . Drug use: No  . Sexual activity: Not on file   Other Topics Concern  . Not on file   Social History Narrative  . No narrative on file    REVIEW OF SYSTEMS: Constitutional:  Weakness. ENT:  No nose bleeeds Pulm:  No shortness of breath. Occasional, mostly nonproductive cough. CV:  No palpitations, no LE edema. No chest pain GU:  No hematuria, no frequency GI:  No dysphagia. See history of present illness. Heme:  See HPI   Transfusions:  See HPI Neuro:  No headaches, no peripheral tingling or numbness Derm:  No itching, no rash or sores.  Endocrine:  No sweats or chills.  No polyuria or dysuria Immunization:  She got the flu and Pneumovax shot in 2016. I don't see records of her having received hepatitis A or B immunization though it was mentioned in the last GI office visit with Dr. Verlene Mayer in 2016. Travel:  None beyond local counties in last few months.    PHYSICAL EXAM: Vital signs in last 24 hours: Vitals:   08/22/16 0800 08/22/16 0821  BP: (!) 95/58   Pulse: (!) 111   Resp: (!) 25   Temp:  97.7 F (36.5 C)   Wt Readings from Last 3 Encounters:  08/22/16 57.6 kg (126 lb 15.8  oz)  04/27/16 57.6 kg (127 lb)  04/25/16 57.6 kg (127 lb)    General: Chronically and acutely unwell looking, malnourished appearing WF. She is alert. Head:  No signs of head trauma. No asymmetry.  Eyes:  Mild scleral icterus. Conjunctiva pale. Ears:  Not HOH.  Nose:  No congestion or discharge Mouth:  Poor dentition. Dry and pale oral mucous membranes. No visible fresh or dried blood.  No lesions. Neck:  No JVD, no masses, no TMG. Lungs:  Clear bilaterally. No cough. Heart: RRR. No MRG. S1, S2 present. Abdomen:  Not tender or distended. No masses. No HSM. Bowel sounds active..   Rectal: Patient incontinent of brown, liquid stool. There is no blush of any blood with this in the bedding.   Musc/Skeltl: No bruising yet but swelling in the left thigh and hip area. Extremities:  Swelling in the left thigh and hip. No pitting edema but generally has muscle wasting and looks to be protein malnourished.  Neurologic:  Patient is alert. She is oriented 3. She answers appropriately. She moves her limbs though not the left leg because of pain. Skin:  Some telangiectasia on the upper chest. Tattoos:  None Nodes:  No cervical adenopathy.   Psych:  Cooperative, affect somewhat blunted. Calm.  Intake/Output from previous day: 12/11 0701 - 12/12 0700 In: 6895.7 [I.V.:2305; Blood:550.7; IV Piggyback:4040] Out: 285 [Urine:285] Intake/Output this shift: Total I/O In: 175 [I.V.:175]  Out: -   LAB RESULTS:  Recent Labs  08/21/16 1249 08/21/16 2130 08/22/16 0100  WBC 16.2*  --  14.7*  HGB 6.3* 8.6* 8.2*  HCT 21.1* 27.1* 25.5*  PLT 86*  --  46*   BMET Lab Results  Component Value Date   NA 137 08/22/2016   NA 135 08/22/2016   NA 135 08/21/2016   K 3.8 08/22/2016   K 3.1 (L) 08/22/2016   K 3.0 (L) 08/21/2016   CL 104 08/22/2016   CL 102 08/22/2016   CL 99 (L) 08/21/2016   CO2 23 08/22/2016   CO2 25 08/22/2016   CO2 21 (L) 08/21/2016   GLUCOSE 102 (H) 08/22/2016   GLUCOSE 104 (H)  08/22/2016   GLUCOSE 119 (H) 08/21/2016   BUN 14 08/22/2016   BUN 14 08/22/2016   BUN 14 08/21/2016   CREATININE 1.70 (H) 08/22/2016   CREATININE 1.84 (H) 08/22/2016   CREATININE 1.93 (H) 08/21/2016   CALCIUM 6.8 (L) 08/22/2016   CALCIUM 6.9 (L) 08/22/2016   CALCIUM 7.1 (L) 08/21/2016   LFT  Recent Labs  08/21/16 1249 08/22/16 0405  PROT 6.7 5.9*  ALBUMIN 2.1* 1.8*  AST 221* 205*  ALT 54 42  ALKPHOS 92 61  BILITOT 8.1* 6.9*  BILIDIR  --  3.7*  IBILI  --  3.2*   PT/INR Lab Results  Component Value Date   INR 3.24 08/21/2016   INR 1.20 04/27/2016   INR 1.31 07/15/2015   Hepatitis Panel No results for input(s): HEPBSAG, HCVAB, HEPAIGM, HEPBIGM in the last 72 hours. C-Diff No components found for: CDIFF Lipase     Component Value Date/Time   LIPASE 56 (H) 08/21/2016 1249    Drugs of Abuse     Component Value Date/Time   LABOPIA NONE DETECTED 05/26/2015 0430   COCAINSCRNUR NONE DETECTED 05/26/2015 0430   LABBENZ POSITIVE (A) 05/26/2015 0430   AMPHETMU NONE DETECTED 05/26/2015 0430   THCU NONE DETECTED 05/26/2015 0430   LABBARB NONE DETECTED 05/26/2015 0430     RADIOLOGY STUDIES: Dg Chest 1 View  Result Date: 08/21/2016 CLINICAL DATA:  Fall, left hip pain EXAM: CHEST 1 VIEW COMPARISON:  06/25/2015 FINDINGS: Cardiomediastinal silhouette is stable. No infiltrate or pleural effusion. No pulmonary edema. Minimal thoracic dextroscoliosis. IMPRESSION: No active disease. Electronically Signed   By: Lahoma Crocker M.D.   On: 08/21/2016 13:54   Ct Head Wo Contrast  Result Date: 08/21/2016 CLINICAL DATA:  Fall and altered mental status EXAM: CT HEAD WITHOUT CONTRAST CT CERVICAL SPINE WITHOUT CONTRAST TECHNIQUE: Multidetector CT imaging of the head and cervical spine was performed following the standard protocol without intravenous contrast. Multiplanar CT image reconstructions of the cervical spine were also generated. COMPARISON:  Head CT 05/25/2015 and 03/24/2010.  FINDINGS: CT HEAD FINDINGS Brain: No mass lesion, intraparenchymal hemorrhage or extra-axial collection. No evidence of acute cortical infarct. There is encephalomalacia within the left frontal lobe at the site of prior MCA infarct. There is mildly age advanced volume loss. No hydrocephalus. Vascular: No hyperdense vessel or unexpected calcification. Skull: Normal visualized skull base, calvarium and extracranial soft tissues. Sinuses/Orbits: No sinus fluid levels or advanced mucosal thickening. No mastoid effusion. Normal orbits. CT CERVICAL SPINE FINDINGS Alignment: No static subluxation. Facets are aligned. Occipital condyles are normally positioned. Reversal of the normal cervical lordosis. Minimal C5-C6 anterolisthesis slightly increased relative to 03/24/2010, possibly positional. Skull base and vertebrae: No acute fracture. Soft tissues and spinal canal: No prevertebral fluid or swelling. No visible  canal hematoma. Disc levels: No advanced spinal canal or neural foraminal stenosis. Upper chest: No pneumothorax, pulmonary nodule or pleural effusion. Other: Normal visualized paraspinal cervical soft tissues. IMPRESSION: 1. No acute intracranial abnormality. 2. No acute fracture of the cervical spine. 3. 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 Electronically Signed   By: Deatra Robinson M.D.   On: 08/21/2016 14:40   Ct Cervical Spine Wo Contrast  Result Date: 08/21/2016 CLINICAL DATA:  Fall and altered mental status EXAM: CT HEAD WITHOUT CONTRAST CT CERVICAL SPINE WITHOUT CONTRAST TECHNIQUE: Multidetector CT imaging of the head and cervical spine was performed following the standard protocol without intravenous contrast. Multiplanar CT image reconstructions of the cervical spine were also generated. COMPARISON:  Head CT 05/25/2015 and 03/24/2010. FINDINGS: CT HEAD FINDINGS Brain: No mass lesion, intraparenchymal hemorrhage or extra-axial collection. No evidence of acute  cortical infarct. There is encephalomalacia within the left frontal lobe at the site of prior MCA infarct. There is mildly age advanced volume loss. No hydrocephalus. Vascular: No hyperdense vessel or unexpected calcification. Skull: Normal visualized skull base, calvarium and extracranial soft tissues. Sinuses/Orbits: No sinus fluid levels or advanced mucosal thickening. No mastoid effusion. Normal orbits. CT CERVICAL SPINE FINDINGS Alignment: No static subluxation. Facets are aligned. Occipital condyles are normally positioned. Reversal of the normal cervical lordosis. Minimal C5-C6 anterolisthesis slightly increased relative to 03/24/2010, possibly positional. Skull base and vertebrae: No acute fracture. Soft tissues and spinal canal: No prevertebral fluid or swelling. No visible canal hematoma. Disc levels: No advanced spinal canal or neural foraminal stenosis. Upper chest: No pneumothorax, pulmonary nodule or pleural effusion. Other: Normal visualized paraspinal cervical soft tissues. IMPRESSION: 1. No acute intracranial abnormality. 2. No acute fracture of the cervical spine. 3. 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 Electronically Signed   By: Deatra Robinson M.D.   On: 08/21/2016 14:40   Dg Chest Port 1 View  Result Date: 08/22/2016 CLINICAL DATA:  Shortness of breath.  Respiratory failure. EXAM: PORTABLE CHEST 1 VIEW COMPARISON:  Single-view of the chest 08/21/2016. PA and lateral chest 06/25/2015. FINDINGS: Right IJ catheter is again seen with the tip projecting near the superior cavoatrial junction. Patchy airspace opacity is seen throughout the left chest the right lung is clear. No pneumothorax or pleural effusion. Heart size is upper normal. IMPRESSION: Patchy airspace opacity throughout the left chest worrisome for pneumonia. Electronically Signed   By: Drusilla Kanner M.D.   On: 08/22/2016 07:44   Dg Chest Portable 1 View  Result Date: 08/21/2016 CLINICAL  DATA:  Central line placement. EXAM: PORTABLE CHEST 1 VIEW COMPARISON:  Radiograph of same day. FINDINGS: The heart size and mediastinal contours are within normal limits. Both lungs are clear. The visualized skeletal structures are unremarkable. Interval placement of right internal jugular catheter with distal tip in expected position of cavoatrial junction. No pneumothorax or pleural effusion is noted. IMPRESSION: Interval placement of right internal jugular catheter with distal tip in expected position of cavoatrial junction. No pneumothorax is noted. Electronically Signed   By: Lupita Raider, M.D.   On: 08/21/2016 15:01   Dg Hip Unilat W Or Wo Pelvis 2-3 Views Left  Result Date: 08/21/2016 CLINICAL DATA:  Left hip pain since a fall 08/18/2016. Patient found down 3 days later. EXAM: DG HIP (WITH OR WITHOUT PELVIS) 2-3V LEFT COMPARISON:  None. FINDINGS: Acute left intertrochanteric fracture is identified. Left femoral head is located. No other acute abnormality. IMPRESSION:  Acute left intertrochanteric fracture. Electronically Signed   By: Inge Rise M.D.   On: 08/21/2016 13:54    IMPRESSION:   *  GI bleed.  Hx GERD and peptic esoph stricture but no varices or portal gastropathy on EGD 07/2015.   *  Cirrhosis and ETOH hepatitis.  *  Coagulopathy.  S/p FFP  *  Thrombocytopenia.  S/p platelets.  *  Macrocytic anemia.  S/p PRBCs.   *  AKI.    *  Left hip  Fx.  or arthritis consult pending.  PLAN:     *   Plan EGD today. Time to be worked out.   Azucena Freed  08/22/2016, 9:05 AM Pager: [phone]

## 2016-08-23 ENCOUNTER — Inpatient Hospital Stay (HOSPITAL_COMMUNITY): Payer: Medicaid Other

## 2016-08-23 ENCOUNTER — Encounter (HOSPITAL_COMMUNITY)
Admission: EM | Disposition: A | Discharge status: Skilled Nursing Facility | Payer: Self-pay | Source: Home / Self Care | Attending: Internal Medicine

## 2016-08-23 ENCOUNTER — Inpatient Hospital Stay (HOSPITAL_COMMUNITY): Payer: Medicaid Other | Admitting: Anesthesiology

## 2016-08-23 DIAGNOSIS — R748 Abnormal levels of other serum enzymes: Secondary | ICD-10-CM

## 2016-08-23 DIAGNOSIS — Z01810 Encounter for preprocedural cardiovascular examination: Secondary | ICD-10-CM

## 2016-08-23 DIAGNOSIS — F10988 Alcohol use, unspecified with other alcohol-induced disorder: Secondary | ICD-10-CM

## 2016-08-23 DIAGNOSIS — W19XXXA Unspecified fall, initial encounter: Secondary | ICD-10-CM

## 2016-08-23 DIAGNOSIS — S72142A Displaced intertrochanteric fracture of left femur, initial encounter for closed fracture: Secondary | ICD-10-CM

## 2016-08-23 HISTORY — PX: FEMUR IM NAIL: SHX1597

## 2016-08-23 LAB — CBC WITH DIFFERENTIAL/PLATELET
BASOS PCT: 0 %
Basophils Absolute: 0 10*3/uL (ref 0.0–0.1)
EOS PCT: 1 %
Eosinophils Absolute: 0.1 10*3/uL (ref 0.0–0.7)
HEMATOCRIT: 23.3 % — AB (ref 36.0–46.0)
Hemoglobin: 7.5 g/dL — ABNORMAL LOW (ref 12.0–15.0)
LYMPHS ABS: 1.6 10*3/uL (ref 0.7–4.0)
Lymphocytes Relative: 21 %
MCH: 33.8 pg (ref 26.0–34.0)
MCHC: 32.2 g/dL (ref 30.0–36.0)
MCV: 105 fL — AB (ref 78.0–100.0)
MONOS PCT: 6 %
Monocytes Absolute: 0.5 10*3/uL (ref 0.1–1.0)
NEUTROS ABS: 5.6 10*3/uL (ref 1.7–7.7)
Neutrophils Relative %: 72 %
Platelets: 89 10*3/uL — ABNORMAL LOW (ref 150–400)
RBC: 2.22 MIL/uL — AB (ref 3.87–5.11)
RDW: 27.6 % — AB (ref 11.5–15.5)
WBC: 7.8 10*3/uL (ref 4.0–10.5)

## 2016-08-23 LAB — PROTIME-INR
INR: 2.18
INR: 1.65
PROTHROMBIN TIME: 19.7 s — AB (ref 11.4–15.2)
Prothrombin Time: 24.6 seconds — ABNORMAL HIGH (ref 11.4–15.2)

## 2016-08-23 LAB — MAGNESIUM: Magnesium: 2.1 mg/dL (ref 1.7–2.4)

## 2016-08-23 LAB — BASIC METABOLIC PANEL
Anion gap: 7 (ref 5–15)
BUN: 24 mg/dL — AB (ref 6–20)
CHLORIDE: 107 mmol/L (ref 101–111)
CO2: 25 mmol/L (ref 22–32)
CREATININE: 1.48 mg/dL — AB (ref 0.44–1.00)
Calcium: 7 mg/dL — ABNORMAL LOW (ref 8.9–10.3)
GFR calc Af Amer: 44 mL/min — ABNORMAL LOW (ref >60)
GFR calc non Af Amer: 38 mL/min — ABNORMAL LOW (ref >60)
GLUCOSE: 151 mg/dL — AB (ref 65–99)
Potassium: 3.1 mmol/L — ABNORMAL LOW (ref 3.5–5.1)
Sodium: 139 mmol/L (ref 135–145)

## 2016-08-23 LAB — HEPATIC FUNCTION PANEL
ALK PHOS: 76 U/L (ref 38–126)
ALT: 45 U/L (ref 14–54)
AST: 182 U/L — AB (ref 15–41)
Albumin: 1.8 g/dL — ABNORMAL LOW (ref 3.5–5.0)
BILIRUBIN DIRECT: 2.9 mg/dL — AB (ref 0.1–0.5)
Indirect Bilirubin: 2.4 mg/dL — ABNORMAL HIGH (ref 0.3–0.9)
Total Bilirubin: 5.3 mg/dL — ABNORMAL HIGH (ref 0.3–1.2)
Total Protein: 6.3 g/dL — ABNORMAL LOW (ref 6.5–8.1)

## 2016-08-23 LAB — PREPARE PLATELET PHERESIS
Blood Product Expiration Date: 201712122359
ISSUE DATE / TIME: 201712121231
Unit Type and Rh: 7300

## 2016-08-23 LAB — TROPONIN I: TROPONIN I: 2.19 ng/mL — AB (ref <0.03)

## 2016-08-23 LAB — LACTIC ACID, PLASMA: LACTIC ACID, VENOUS: 1.2 mmol/L (ref 0.5–1.9)

## 2016-08-23 LAB — URINE CULTURE: CULTURE: NO GROWTH

## 2016-08-23 LAB — PREPARE RBC (CROSSMATCH)

## 2016-08-23 LAB — PHOSPHORUS: Phosphorus: <1 mg/dL — CL (ref 2.5–4.6)

## 2016-08-23 SURGERY — INSERTION, INTRAMEDULLARY ROD, FEMUR
Anesthesia: General | Site: Hip | Laterality: Left

## 2016-08-23 MED ORDER — PROPOFOL 10 MG/ML IV BOLUS
INTRAVENOUS | Status: DC | PRN
  Administered 2016-08-23: 120 mg via INTRAVENOUS

## 2016-08-23 MED ORDER — METOPROLOL TARTRATE 25 MG PO TABS
25.0000 mg | ORAL_TABLET | Freq: Two times a day (BID) | ORAL | Status: DC
  Administered 2016-08-23 – 2016-08-29 (×6): 25 mg via ORAL
  Filled 2016-08-23 (×8): qty 1

## 2016-08-23 MED ORDER — SUCCINYLCHOLINE CHLORIDE 20 MG/ML IJ SOLN
INTRAMUSCULAR | Status: DC | PRN
  Administered 2016-08-23: 140 mg via INTRAVENOUS

## 2016-08-23 MED ORDER — FENTANYL CITRATE (PF) 100 MCG/2ML IJ SOLN: 25.0000 ug | INTRAMUSCULAR | Status: DC | PRN

## 2016-08-23 MED ORDER — ONDANSETRON HCL 4 MG/2ML IJ SOLN
4.0000 mg | Freq: Four times a day (QID) | INTRAMUSCULAR | Status: DC | PRN
Start: 2016-08-23 — End: 2016-08-31
  Administered 2016-08-23 – 2016-08-25 (×2): 4 mg via INTRAVENOUS
  Filled 2016-08-23 (×2): qty 2

## 2016-08-23 MED ORDER — SUCCINYLCHOLINE CHLORIDE 200 MG/10ML IV SOSY
PREFILLED_SYRINGE | INTRAVENOUS | Status: AC
  Filled 2016-08-23: qty 30

## 2016-08-23 MED ORDER — ONDANSETRON HCL 4 MG/2ML IJ SOLN
INTRAMUSCULAR | Status: AC
  Filled 2016-08-23: qty 2

## 2016-08-23 MED ORDER — CEFAZOLIN SODIUM-DEXTROSE 2-4 GM/100ML-% IV SOLN
INTRAVENOUS | Status: AC
  Filled 2016-08-23: qty 100

## 2016-08-23 MED ORDER — EPHEDRINE 5 MG/ML INJ
INTRAVENOUS | Status: AC
  Filled 2016-08-23: qty 10

## 2016-08-23 MED ORDER — FENTANYL CITRATE (PF) 100 MCG/2ML IJ SOLN
INTRAMUSCULAR | Status: AC
  Filled 2016-08-23: qty 2

## 2016-08-23 MED ORDER — CEFAZOLIN IN D5W 1 GM/50ML IV SOLN
INTRAVENOUS | Status: DC | PRN
  Administered 2016-08-23: 1 g via INTRAVENOUS

## 2016-08-23 MED ORDER — PROPOFOL 10 MG/ML IV BOLUS
INTRAVENOUS | Status: AC
  Filled 2016-08-23: qty 20

## 2016-08-23 MED ORDER — SODIUM CHLORIDE 0.9 % IV SOLN
INTRAVENOUS | Status: DC | PRN
  Administered 2016-08-23: 22:00:00 via INTRAVENOUS

## 2016-08-23 MED ORDER — OXYCODONE HCL 5 MG/5ML PO SOLN: 5.0000 mg | Freq: Once | ORAL | Status: DC | PRN

## 2016-08-23 MED ORDER — OXYCODONE HCL 5 MG PO TABS: 5.0000 mg | ORAL_TABLET | Freq: Once | ORAL | Status: DC | PRN

## 2016-08-23 MED ORDER — FENTANYL CITRATE (PF) 250 MCG/5ML IJ SOLN
INTRAMUSCULAR | Status: DC | PRN
  Administered 2016-08-23: 50 ug via INTRAVENOUS

## 2016-08-23 MED ORDER — SODIUM CHLORIDE 0.9 % IV SOLN
Freq: Once | INTRAVENOUS | Status: AC
  Administered 2016-08-23: 09:00:00 via INTRAVENOUS

## 2016-08-23 MED ORDER — EPHEDRINE SULFATE 50 MG/ML IJ SOLN
INTRAMUSCULAR | Status: DC | PRN
  Administered 2016-08-23 (×3): 10 mg via INTRAVENOUS

## 2016-08-23 MED ORDER — MIDAZOLAM HCL 2 MG/2ML IJ SOLN
INTRAMUSCULAR | Status: AC
  Filled 2016-08-23: qty 2

## 2016-08-23 MED ORDER — LIDOCAINE 2% (20 MG/ML) 5 ML SYRINGE
INTRAMUSCULAR | Status: AC
  Filled 2016-08-23: qty 5

## 2016-08-23 MED ORDER — POTASSIUM PHOSPHATE MONOBASIC 500 MG PO TABS
500.0000 mg | ORAL_TABLET | Freq: Three times a day (TID) | ORAL | Status: DC
Start: 2016-08-23 — End: 2016-08-24
  Administered 2016-08-23 (×2): 500 mg via ORAL
  Filled 2016-08-23 (×7): qty 1

## 2016-08-23 MED ORDER — ONDANSETRON HCL 4 MG/2ML IJ SOLN: 4.0000 mg | Freq: Once | INTRAMUSCULAR | Status: DC | PRN

## 2016-08-23 MED ORDER — FENTANYL CITRATE (PF) 100 MCG/2ML IJ SOLN
25.0000 ug | INTRAMUSCULAR | Status: DC | PRN
Start: 2016-08-23 — End: 2016-08-24

## 2016-08-23 MED ORDER — 0.9 % SODIUM CHLORIDE (POUR BTL) OPTIME
TOPICAL | Status: DC | PRN
  Administered 2016-08-23: 1000 mL

## 2016-08-23 MED ORDER — FUROSEMIDE 10 MG/ML IJ SOLN
40.0000 mg | INTRAMUSCULAR | Status: AC
  Administered 2016-08-23 (×2): 40 mg via INTRAVENOUS
  Filled 2016-08-23 (×2): qty 4

## 2016-08-23 MED ORDER — LACTATED RINGERS IV SOLN
INTRAVENOUS | Status: DC | PRN
  Administered 2016-08-23 (×2): via INTRAVENOUS

## 2016-08-23 MED ORDER — ONDANSETRON HCL 4 MG/2ML IJ SOLN
INTRAMUSCULAR | Status: DC | PRN
  Administered 2016-08-23: 4 mg via INTRAVENOUS

## 2016-08-23 MED ORDER — MORPHINE SULFATE (PF) 2 MG/ML IV SOLN
1.0000 mg | INTRAVENOUS | Status: DC | PRN
  Administered 2016-08-23 – 2016-08-27 (×4): 1 mg via INTRAVENOUS
  Filled 2016-08-23 (×5): qty 1

## 2016-08-23 SURGICAL SUPPLY — 48 items
BLADE SURG 15 STRL LF DISP TIS (BLADE) ×1 IMPLANT
BLADE SURG 15 STRL SS (BLADE) ×3
BLADE SURG ROTATE 9660 (MISCELLANEOUS) IMPLANT
COVER PERINEAL POST (MISCELLANEOUS) ×3 IMPLANT
COVER SURGICAL LIGHT HANDLE (MISCELLANEOUS) ×3 IMPLANT
DRAPE ORTHO SPLIT 77X108 STRL (DRAPES)
DRAPE PROXIMA HALF (DRAPES) IMPLANT
DRAPE STERI IOBAN 125X83 (DRAPES) ×3 IMPLANT
DRAPE SURG ORHT 6 SPLT 77X108 (DRAPES) IMPLANT
DRSG AQUACEL AG ADV 3.5X 4 (GAUZE/BANDAGES/DRESSINGS) ×6 IMPLANT
DRSG MEPILEX BORDER 4X4 (GAUZE/BANDAGES/DRESSINGS) IMPLANT
DRSG MEPILEX BORDER 4X8 (GAUZE/BANDAGES/DRESSINGS) ×3 IMPLANT
DURAPREP 26ML APPLICATOR (WOUND CARE) ×3 IMPLANT
ELECT REM PT RETURN 9FT ADLT (ELECTROSURGICAL) ×3
ELECTRODE REM PT RTRN 9FT ADLT (ELECTROSURGICAL) ×1 IMPLANT
FACESHIELD WRAPAROUND (MASK) ×3 IMPLANT
FACESHIELD WRAPAROUND OR TEAM (MASK) ×1 IMPLANT
GAUZE XEROFORM 5X9 LF (GAUZE/BANDAGES/DRESSINGS) ×3 IMPLANT
GLOVE BIOGEL PI IND STRL 8 (GLOVE) ×1 IMPLANT
GLOVE BIOGEL PI INDICATOR 8 (GLOVE) ×2
GLOVE SURG ORTHO 8.0 STRL STRW (GLOVE) ×3 IMPLANT
GOWN STRL REUS W/ TWL LRG LVL3 (GOWN DISPOSABLE) ×1 IMPLANT
GOWN STRL REUS W/ TWL XL LVL3 (GOWN DISPOSABLE) IMPLANT
GOWN STRL REUS W/TWL LRG LVL3 (GOWN DISPOSABLE) ×3
GOWN STRL REUS W/TWL XL LVL3 (GOWN DISPOSABLE)
GUIDE PIN 3.2X343 (PIN) ×2
GUIDE PIN 3.2X343MM (PIN) ×6
KIT BASIN OR (CUSTOM PROCEDURE TRAY) ×3 IMPLANT
KIT ROOM TURNOVER OR (KITS) ×3 IMPLANT
LINER BOOT UNIVERSAL DISP (MISCELLANEOUS) ×3 IMPLANT
MANIFOLD NEPTUNE II (INSTRUMENTS) ×3 IMPLANT
NAIL LEFT 10X36 (Nail) ×2 IMPLANT
NS IRRIG 1000ML POUR BTL (IV SOLUTION) ×3 IMPLANT
PACK GENERAL/GYN (CUSTOM PROCEDURE TRAY) ×3 IMPLANT
PAD ARMBOARD 7.5X6 YLW CONV (MISCELLANEOUS) ×6 IMPLANT
PIN GUIDE 3.2X343MM (PIN) IMPLANT
SCREW LAG COMPR KIT 90/85 (Screw) ×2 IMPLANT
SPONGE LAP 4X18 X RAY DECT (DISPOSABLE) IMPLANT
STAPLER VISISTAT 35W (STAPLE) ×3 IMPLANT
SUT ETHILON 2 0 FS 18 (SUTURE) IMPLANT
SUT ETHILON 3 0 PS 1 (SUTURE) ×6 IMPLANT
SUT VIC AB 0 CT1 27 (SUTURE) ×3
SUT VIC AB 0 CT1 27XBRD ANBCTR (SUTURE) ×1 IMPLANT
SUT VIC AB 2-0 CTB1 (SUTURE) ×3 IMPLANT
TAPE STRIPS DRAPE STRL (GAUZE/BANDAGES/DRESSINGS) IMPLANT
TOWEL OR 17X24 6PK STRL BLUE (TOWEL DISPOSABLE) ×3 IMPLANT
TOWEL OR 17X26 10 PK STRL BLUE (TOWEL DISPOSABLE) ×3 IMPLANT
WATER STERILE IRR 1000ML POUR (IV SOLUTION) ×3 IMPLANT

## 2016-08-23 NOTE — Transfer of Care (Signed)
Immediate Anesthesia Transfer of Care Note  Patient: Debbie ElseDeborah C Hlavac  Procedure(s) Performed: Procedure(s): INTRAMEDULLARY HIP SCREW left (Left)  Patient Location: PACU  Anesthesia Type:General  Level of Consciousness: sedated and patient cooperative  Airway & Oxygen Therapy: Patient connected to nasal cannula oxygen  Post-op Assessment: Report given to RN and Post -op Vital signs reviewed and stable  Post vital signs: Reviewed and stable  Last Vitals:  Vitals:   08/23/16 1939 08/23/16 2330  BP: 131/80   Pulse: 77   Resp: 20   Temp: 36.9 C (P) 36.4 C    Last Pain:  Vitals:   08/23/16 1939  TempSrc: Oral  PainSc: 10-Worst pain ever         Complications: No apparent anesthesia complications

## 2016-08-23 NOTE — Brief Op Note (Signed)
08/21/2016 - 08/23/2016  11:14 PM  PATIENT:  [name]  60 y.o. female  PRE-OPERATIVE DIAGNOSIS:  Fractured Left Hip  POST-OPERATIVE DIAGNOSIS:  Fractured Left Hip  PROCEDURE:  Procedure(s): INTRAMEDULLARY HIP SCREW left  SURGEON:  Surgeon(s): Cammy CopaScott Gregory Dean, MD  ASSISTANT: none  ANESTHESIA:   general  EBL: 75 ml    Total I/O In: 1281 [I.V.:1000; Blood:281] Out: 1060 [Urine:985; Blood:75]  BLOOD ADMINISTERED: none  DRAINS: none   LOCAL MEDICATIONS USED:  none  SPECIMEN:  No Specimen  COUNTS:  YES  TOURNIQUET:  * No tourniquets in log *  DICTATION: .Other Dictation: Dictation Number [id number]  PLAN OF CARE: Admit to inpatient   PATIENT DISPOSITION:  PACU - hemodynamically stable

## 2016-08-23 NOTE — Progress Notes (Signed)
CRITICAL VALUE ALERT  Critical value received:  Pho <1.0  Date of notification:  08/23/2016  Time of notification:  05:06  Critical value read back: yes  Nurse who received alert: Loma SenderBrittney Jonnathan Birman  MD notified (1st page):  Time of first page:  :  MD notified (2nd page):  Time of second page:  Responding MD:  Sharol HarnessSimmons  Time MD responded:  05:07

## 2016-08-23 NOTE — Consult Note (Signed)
Reason for Consult:left hip pain Referring Physician Dr Skeet Latch : [name] is an 60 y.o. female.  HPI: [name] is a 60 year old patient admitted for GI bleed. She had a fall approximately 4 days ago onto her left hip. She denies and orthopedic complaints. She has significant medical complexity and comorbidities. Currently she has been transfused up to a hemoglobin of 7.5 with INR of 1.65 and platelet count to 90,000. Cardiology has given her risk stratification. She denies any other orthopedic complaints  The patient does report heavy alcohol use    Past Medical History:  Diagnosis Date  . Alcohol abuse    quit 05/23/15  . Alcoholic cirrhosis ([place])    05/2015  . 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 patient reports mental status clear, possible had change related to DTs  . Anemia of chronic disease   . Cholelithiasis   . Chronic GI bleeding   . Coagulopathy ([place])   . Compression fracture    L1, fall in 05/2015  . Compression fracture of lumbar vertebra ([place])   . GERD (gastroesophageal reflux disease)   . Hepatic steatosis   . Hypertension   . Normal cardiac stress test 2008   myoview  . Stroke [facility]) 2008   left sided weakness  . Thrombocytopenia ([place])     Past Surgical History:  Procedure Laterality Date  . APPENDECTOMY    . BIOPSY N/A 07/26/2015   Procedure: BIOPSY;  Surgeon: Daneil Dolin, MD;  Location: [facility];  Service: Endoscopy;  Laterality: N/A;  Gastric, Esophageal  . COLONOSCOPY     ???  . ESOPHAGOGASTRODUODENOSCOPY  04/2007   RMR: tight peptic stricture status post dilation, antral erosions, superficial ulcerations, submucosal duodenal mass, gastric biopsy positive for H. pylori, duodenal biopsy benign, completed H. pylori treatment  . ESOPHAGOGASTRODUODENOSCOPY  07/2007   RMR: peptic stricture status post dilation, gastric erosions, 2 cm submucosal duodenal mass. Biopsies negative.referred for endoscopic ultrasound of submucosal duodenal mass but unclear  whether this was completed.  . ESOPHAGOGASTRODUODENOSCOPY N/A 08/22/2016   Procedure: ESOPHAGOGASTRODUODENOSCOPY (EGD);  Surgeon: Mauri Pole, MD;  Location: [facility] ENDOSCOPY;  Service: Endoscopy;  Laterality: N/A;  . ESOPHAGOGASTRODUODENOSCOPY (EGD) WITH PROPOFOL N/A 07/26/2015   RMR: Abnoramal esophagus/short distal peptic stricture-status post dilation with scope passage- status post biopsy. No varices. Hiatal hernia. Abnormal stomach-status post biopsy. duodenal mass of uncertain significance.     Family History  Problem Relation Age of Onset  . Leukemia Mother   . Cirrhosis Paternal Uncle     etoh  . Colon cancer Neg Hx     Social History:  reports that she has been smoking Cigarettes.  She has a 60.00 pack-year smoking history. She has never used smokeless tobacco. She reports that she drinks about 12.0 oz of alcohol per week . She reports that she does not use drugs.  Allergies:  Allergies  Allergen Reactions  . Loratadine Rash    Medications: I have reviewed the patient's current medications.  Results for orders placed or performed during the hospital encounter of 08/21/16 (from the past 48 hour(s))  Basic metabolic panel     Status: Abnormal   Collection Time: 08/21/16  9:30 PM  Result Value Ref Range   Sodium 135 135 - 145 mmol/L   Potassium 3.0 (L) 3.5 - 5.1 mmol/L   Chloride 99 (L) 101 - 111 mmol/L   CO2 21 (L) 22 - 32 mmol/L   Glucose, Bld 119 (H) 65 - 99 mg/dL   BUN  14 6 - 20 mg/dL   Creatinine, Ser 1.93 (H) 0.44 - 1.00 mg/dL   Calcium 7.1 (L) 8.9 - 10.3 mg/dL   GFR calc non Af Amer 27 (L) >60 mL/min   GFR calc Af Amer 32 (L) >60 mL/min    Comment: (NOTE) The eGFR has been calculated using the CKD EPI equation. This calculation has not been validated in all clinical situations. eGFR's persistently <60 mL/min signify possible Chronic Kidney Disease.    Anion gap 15 5 - 15  Hemoglobin and hematocrit, blood     Status: Abnormal   Collection Time: 08/21/16   9:30 PM  Result Value Ref Range   Hemoglobin 8.6 (L) 12.0 - 15.0 g/dL   HCT 27.1 (L) 36.0 - 46.0 %  Lactic acid, plasma     Status: Abnormal   Collection Time: 08/21/16  9:30 PM  Result Value Ref Range   Lactic Acid, Venous 8.4 (HH) 0.5 - 1.9 mmol/L    Comment: CRITICAL RESULT CALLED TO, READ BACK BY AND VERIFIED WITH: HANCOCK M,RN 08/21/16 2231 WAYK   Troponin I     Status: Abnormal   Collection Time: 08/21/16  9:30 PM  Result Value Ref Range   Troponin I 1.66 (HH) <0.03 ng/mL    Comment: CRITICAL RESULT CALLED TO, READ BACK BY AND VERIFIED WITH: HANCOCK M,RN 08/21/16 2234 WAYK   Procalcitonin     Status: None   Collection Time: 08/21/16  9:30 PM  Result Value Ref Range   Procalcitonin 5.27 ng/mL    Comment:        Interpretation: PCT > 2 ng/mL: Systemic infection (sepsis) is likely, unless other causes are known. (NOTE)         ICU PCT Algorithm               Non ICU PCT Algorithm    ----------------------------     ------------------------------         PCT < 0.25 ng/mL                 PCT < 0.1 ng/mL     Stopping of antibiotics            Stopping of antibiotics       strongly encouraged.               strongly encouraged.    ----------------------------     ------------------------------       PCT level decrease by               PCT < 0.25 ng/mL       >= 80% from peak PCT       OR PCT 0.25 - 0.5 ng/mL          Stopping of antibiotics                                             encouraged.     Stopping of antibiotics           encouraged.    ----------------------------     ------------------------------       PCT level decrease by              PCT >= 0.25 ng/mL       < 80% from peak PCT        AND PCT >= 0.5 ng/mL  Continuing antibiotics                                               encouraged.       Continuing antibiotics            encouraged.    ----------------------------     ------------------------------     PCT level increase compared           PCT > 0.5 ng/mL         with peak PCT AND          PCT >= 0.5 ng/mL             Escalation of antibiotics                                          strongly encouraged.      Escalation of antibiotics        strongly encouraged.   CBC     Status: Abnormal   Collection Time: 08/22/16  1:00 AM  Result Value Ref Range   WBC 14.7 (H) 4.0 - 10.5 K/uL   RBC 2.41 (L) 3.87 - 5.11 MIL/uL   Hemoglobin 8.2 (L) 12.0 - 15.0 g/dL   HCT 25.5 (L) 36.0 - 46.0 %   MCV 105.8 (H) 78.0 - 100.0 fL   MCH 34.0 26.0 - 34.0 pg   MCHC 32.2 30.0 - 36.0 g/dL   RDW 27.8 (H) 11.5 - 15.5 %   Platelets 46 (L) 150 - 400 K/uL    Comment: REPEATED TO VERIFY SPECIMEN CHECKED FOR CLOTS PLATELET COUNT CONFIRMED BY SMEAR   Basic metabolic panel     Status: Abnormal   Collection Time: 08/22/16  1:00 AM  Result Value Ref Range   Sodium 135 135 - 145 mmol/L   Potassium 3.1 (L) 3.5 - 5.1 mmol/L   Chloride 102 101 - 111 mmol/L   CO2 25 22 - 32 mmol/L   Glucose, Bld 104 (H) 65 - 99 mg/dL   BUN 14 6 - 20 mg/dL   Creatinine, Ser 1.84 (H) 0.44 - 1.00 mg/dL   Calcium 6.9 (L) 8.9 - 10.3 mg/dL   GFR calc non Af Amer 29 (L) >60 mL/min   GFR calc Af Amer 34 (L) >60 mL/min    Comment: (NOTE) The eGFR has been calculated using the CKD EPI equation. This calculation has not been validated in all clinical situations. eGFR's persistently <60 mL/min signify possible Chronic Kidney Disease.    Anion gap 8 5 - 15  Magnesium     Status: Abnormal   Collection Time: 08/22/16  1:00 AM  Result Value Ref Range   Magnesium 1.4 (L) 1.7 - 2.4 mg/dL  Phosphorus     Status: Abnormal   Collection Time: 08/22/16  1:00 AM  Result Value Ref Range   Phosphorus 2.4 (L) 2.5 - 4.6 mg/dL  Lactic acid, plasma     Status: Abnormal   Collection Time: 08/22/16  1:00 AM  Result Value Ref Range   Lactic Acid, Venous 4.7 (HH) 0.5 - 1.9 mmol/L    Comment: CRITICAL RESULT CALLED TO, READ BACK BY AND VERIFIED WITH: HANCOCK M,RN 08/22/16 0205 WAYK    Troponin I     Status: Abnormal  Collection Time: 08/22/16  4:05 AM  Result Value Ref Range   Troponin I 2.10 (HH) <0.03 ng/mL    Comment: CRITICAL VALUE NOTED.  VALUE IS CONSISTENT WITH PREVIOUSLY REPORTED AND CALLED VALUE.  Hepatic function panel     Status: Abnormal   Collection Time: 08/22/16  4:05 AM  Result Value Ref Range   Total Protein 5.9 (L) 6.5 - 8.1 g/dL   Albumin 1.8 (L) 3.5 - 5.0 g/dL   AST 205 (H) 15 - 41 U/L   ALT 42 14 - 54 U/L   Alkaline Phosphatase 61 38 - 126 U/L   Total Bilirubin 6.9 (H) 0.3 - 1.2 mg/dL   Bilirubin, Direct 3.7 (H) 0.1 - 0.5 mg/dL   Indirect Bilirubin 3.2 (H) 0.3 - 0.9 mg/dL  Ammonia     Status: Abnormal   Collection Time: 08/22/16  4:05 AM  Result Value Ref Range   Ammonia 50 (H) 9 - 35 umol/L  Basic metabolic panel     Status: Abnormal   Collection Time: 08/22/16  6:40 AM  Result Value Ref Range   Sodium 137 135 - 145 mmol/L   Potassium 3.8 3.5 - 5.1 mmol/L   Chloride 104 101 - 111 mmol/L   CO2 23 22 - 32 mmol/L   Glucose, Bld 102 (H) 65 - 99 mg/dL   BUN 14 6 - 20 mg/dL   Creatinine, Ser 1.70 (H) 0.44 - 1.00 mg/dL   Calcium 6.8 (L) 8.9 - 10.3 mg/dL   GFR calc non Af Amer 32 (L) >60 mL/min   GFR calc Af Amer 37 (L) >60 mL/min    Comment: (NOTE) The eGFR has been calculated using the CKD EPI equation. This calculation has not been validated in all clinical situations. eGFR's persistently <60 mL/min signify possible Chronic Kidney Disease.    Anion gap 10 5 - 15  Magnesium     Status: None   Collection Time: 08/22/16  6:40 AM  Result Value Ref Range   Magnesium 2.1 1.7 - 2.4 mg/dL  Prepare Pheresed Platelets     Status: None   Collection Time: 08/22/16  9:09 AM  Result Value Ref Range   ISSUE DATE / TIME [phone]    Blood Product Unit Number [id number]    PRODUCT CODE [id number]    Unit Type and Rh 7300    Blood Product Expiration Date 201712122359   ABO/Rh     Status: None   Collection Time: 08/22/16  9:19 AM  Result  Value Ref Range   ABO/RH(D) AB NEG   Type and screen     Status: None (Preliminary result)   Collection Time: 08/22/16  9:44 AM  Result Value Ref Range   ISSUE DATE / TIME [id number]    Blood Product Unit Number [id number]    PRODUCT CODE [id number]    Unit Type and Rh 0600    Blood Product Expiration Date 201712202359    ISSUE DATE / TIME [id number]    Blood Product Unit Number [id number]    PRODUCT CODE [id number]    Unit Type and Rh 0600    Blood Product Expiration Date 201712252359    ISSUE DATE / TIME [id number]    Blood Product Unit Number [id number]    PRODUCT CODE [id number]    Unit Type and Rh 0600    Blood Product Expiration Date [id number]   CBC     Status: Abnormal   Collection Time: 08/22/16  3:00 PM  Result Value Ref Range  WBC 10.3 4.0 - 10.5 K/uL   RBC 2.29 (L) 3.87 - 5.11 MIL/uL   Hemoglobin 7.8 (L) 12.0 - 15.0 g/dL   HCT 24.1 (L) 36.0 - 46.0 %   MCV 105.2 (H) 78.0 - 100.0 fL   MCH 34.1 (H) 26.0 - 34.0 pg   MCHC 32.4 30.0 - 36.0 g/dL   RDW 28.3 (H) 11.5 - 15.5 %   Platelets 112 (L) 150 - 400 K/uL    Comment: REPEATED TO VERIFY CONSISTENT WITH PREVIOUS RESULT   Lactic acid, plasma     Status: None   Collection Time: 08/22/16  3:00 PM  Result Value Ref Range   Lactic Acid, Venous 1.9 0.5 - 1.9 mmol/L  Basic metabolic panel     Status: Abnormal   Collection Time: 08/22/16  3:00 PM  Result Value Ref Range   Sodium 139 135 - 145 mmol/L   Potassium 3.5 3.5 - 5.1 mmol/L   Chloride 107 101 - 111 mmol/L   CO2 23 22 - 32 mmol/L   Glucose, Bld 152 (H) 65 - 99 mg/dL   BUN 18 6 - 20 mg/dL   Creatinine, Ser 1.61 (H) 0.44 - 1.00 mg/dL   Calcium 7.0 (L) 8.9 - 10.3 mg/dL   GFR calc non Af Amer 34 (L) >60 mL/min   GFR calc Af Amer 40 (L) >60 mL/min    Comment: (NOTE) The eGFR has been calculated using the CKD EPI equation. This calculation has not been validated in all clinical situations. eGFR's persistently <60 mL/min signify possible Chronic  Kidney Disease.    Anion gap 9 5 - 15  Basic metabolic panel     Status: Abnormal   Collection Time: 08/23/16  2:00 AM  Result Value Ref Range   Sodium 139 135 - 145 mmol/L   Potassium 3.1 (L) 3.5 - 5.1 mmol/L   Chloride 107 101 - 111 mmol/L   CO2 25 22 - 32 mmol/L   Glucose, Bld 151 (H) 65 - 99 mg/dL   BUN 24 (H) 6 - 20 mg/dL   Creatinine, Ser 1.48 (H) 0.44 - 1.00 mg/dL   Calcium 7.0 (L) 8.9 - 10.3 mg/dL   GFR calc non Af Amer 38 (L) >60 mL/min   GFR calc Af Amer 44 (L) >60 mL/min    Comment: (NOTE) The eGFR has been calculated using the CKD EPI equation. This calculation has not been validated in all clinical situations. eGFR's persistently <60 mL/min signify possible Chronic Kidney Disease.    Anion gap 7 5 - 15  Lactic acid, plasma     Status: None   Collection Time: 08/23/16  3:00 AM  Result Value Ref Range   Lactic Acid, Venous 1.2 0.5 - 1.9 mmol/L  CBC with Differential/Platelet     Status: Abnormal   Collection Time: 08/23/16  3:30 AM  Result Value Ref Range   WBC 7.8 4.0 - 10.5 K/uL   RBC 2.22 (L) 3.87 - 5.11 MIL/uL   Hemoglobin 7.5 (L) 12.0 - 15.0 g/dL   HCT 23.3 (L) 36.0 - 46.0 %   MCV 105.0 (H) 78.0 - 100.0 fL   MCH 33.8 26.0 - 34.0 pg   MCHC 32.2 30.0 - 36.0 g/dL   RDW 27.6 (H) 11.5 - 15.5 %   Platelets 89 (L) 150 - 400 K/uL    Comment: REPEATED TO VERIFY CONSISTENT WITH PREVIOUS RESULT    Neutrophils Relative % 72 %   Lymphocytes Relative 21 %   Monocytes Relative  6 %   Eosinophils Relative 1 %   Basophils Relative 0 %   Neutro Abs 5.6 1.7 - 7.7 K/uL   Lymphs Abs 1.6 0.7 - 4.0 K/uL   Monocytes Absolute 0.5 0.1 - 1.0 K/uL   Eosinophils Absolute 0.1 0.0 - 0.7 K/uL   Basophils Absolute 0.0 0.0 - 0.1 K/uL   RBC Morphology POLYCHROMASIA PRESENT   Magnesium     Status: None   Collection Time: 08/23/16  3:30 AM  Result Value Ref Range   Magnesium 2.1 1.7 - 2.4 mg/dL  Phosphorus     Status: Abnormal   Collection Time: 08/23/16  3:30 AM  Result Value  Ref Range   Phosphorus <1.0 (LL) 2.5 - 4.6 mg/dL    Comment: REPEATED TO VERIFY CRITICAL RESULT CALLED TO, READ BACK BY AND VERIFIED WITH: AVERY B,RN 08/23/16 0506 WAYK   Troponin I     Status: Abnormal   Collection Time: 08/23/16  3:30 AM  Result Value Ref Range   Troponin I 2.19 (HH) <0.03 ng/mL    Comment: CRITICAL VALUE NOTED.  VALUE IS CONSISTENT WITH PREVIOUSLY REPORTED AND CALLED VALUE.  Hepatic function panel     Status: Abnormal   Collection Time: 08/23/16  3:30 AM  Result Value Ref Range   Total Protein 6.3 (L) 6.5 - 8.1 g/dL   Albumin 1.8 (L) 3.5 - 5.0 g/dL   AST 182 (H) 15 - 41 U/L   ALT 45 14 - 54 U/L   Alkaline Phosphatase 76 38 - 126 U/L   Total Bilirubin 5.3 (H) 0.3 - 1.2 mg/dL   Bilirubin, Direct 2.9 (H) 0.1 - 0.5 mg/dL   Indirect Bilirubin 2.4 (H) 0.3 - 0.9 mg/dL  Protime-INR     Status: Abnormal   Collection Time: 08/23/16  3:30 AM  Result Value Ref Range   Prothrombin Time 24.6 (H) 11.4 - 15.2 seconds   INR 2.18   Prepare fresh frozen plasma     Status: None (Preliminary result)   Collection Time: 08/23/16  8:17 AM  Result Value Ref Range   Unit Number [id number]    Blood Component Type THAWED PLASMA    Unit division 00    Status of Unit ISSUED    Transfusion Status OK TO TRANSFUSE   Prepare RBC     Status: None   Collection Time: 08/23/16  8:17 AM  Result Value Ref Range   Order Confirmation ORDER PROCESSED BY BLOOD BANK   Protime-INR     Status: Abnormal   Collection Time: 08/23/16  6:14 PM  Result Value Ref Range   Prothrombin Time 19.7 (H) 11.4 - 15.2 seconds   INR 1.65     Dg Chest Port 1 View  Result Date: 08/22/2016 CLINICAL DATA:  Shortness of breath.  Respiratory failure. EXAM: PORTABLE CHEST 1 VIEW COMPARISON:  Single-view of the chest 08/21/2016. PA and lateral chest 06/25/2015. FINDINGS: Right IJ catheter is again seen with the tip projecting near the superior cavoatrial junction. Patchy airspace opacity is seen throughout the left  chest the right lung is clear. No pneumothorax or pleural effusion. Heart size is upper normal. IMPRESSION: Patchy airspace opacity throughout the left chest worrisome for pneumonia. Electronically Signed   By: Inge Rise M.D.   On: 08/22/2016 07:44    Review of Systems  Constitutional: Positive for malaise/fatigue.  HENT: Negative.   Eyes: Negative.   Respiratory: Positive for cough.   Cardiovascular: Negative.   Gastrointestinal: Positive for nausea and vomiting.  Genitourinary: Negative.   Musculoskeletal: Positive for joint pain.  Skin: Negative.   Neurological: Negative.   Endo/Heme/Allergies: Negative.   Psychiatric/Behavioral: Negative.    Blood pressure 131/80, pulse 77, temperature 98.5 F (36.9 C), temperature source Oral, resp. rate 20, weight 139 lb 5.3 oz (63.2 kg), SpO2 90 %. Physical Exam  Constitutional: She appears well-developed.  HENT:  Head: Normocephalic.  Eyes: Pupils are equal, round, and reactive to light.  Neck: Normal range of motion.  Respiratory: Effort normal.  Neurological: She is alert.  Skin: Skin is warm.  increased heart rate Examination of the left leg demonstrates palpable pedal pulses shortening and some external rotation there is no knee effusion on the left patient has good ankle dorsi/plantar flexion she moves her upper extremities well the wrist elbows and shoulders. Right lower shortening no pain with range of motion ankle near hip. Small bruise/contusion noted anterior aspect of the knee  Assessment/Plan: Impressions left hip fracture in a patient with multiple medical comorbidities. Medical health issues going to get much better than she has right now. Bleeding is a concern that her INR has come down to 1.6 currently. Patient understands the risks and benefits of surgery. All questions answered  Anderson Malta 08/23/2016, 9:22 PM

## 2016-08-23 NOTE — Consult Note (Signed)
Cardiology Consult    Patient ID: SIMARA RHYNER MRN: [id number], DOB/AGE: 10-05-1955   Admit date: 08/21/2016 Date of Consult: 08/23/2016  Primary Physician: Vertis Kelch, NP Reason for Consult: Elevated troponin, Pre op clearance Primary Cardiologist: New Requesting Provider: Dr. Jomarie Longs  Patient Profile    Ms. Balow is a 60 year old female with a past medical history of heavy ETOH abuse (drinks up to 3 one half gallons of liquor a day), anemia, HTN, and CVA.   History of Present Illness    Ms. Rothschild was admitted from [facility] ED on 08/21/16 after being found by her son lying in her bed covered in her own feces. She had fallen 3 days prior and apparently had been lying in bed since then. She was hyotensive, tachycardiac, and anemic upon initial evaluation. Her hemoglobin was 6.3, INR was 3.6 and was found have GI bleeding on rectal exam. Also, found to have closed left intertrochanteric hip fracture.   Her lactic acid was 18 and she was started on broad spectrum antibiotics and given fluid resuscitation. She was transferred to [facility] for management. Her initial troponin was 1.6.   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 No evidence of esophageal varices.   She is in need of surgery to fix her hip and cardiology has been consulted for preoperative evaluation of cardiac risk.  Past Medical History   Past Medical History:  Diagnosis Date  . Alcohol abuse    quit 05/23/15  . Alcoholic cirrhosis (HCC)    05/2015  . Alcoholic dementia (HCC)    05/2015,??? patient reports mental status clear, possible had change related to DTs  . Anemia of chronic disease   . Cholelithiasis   . Chronic GI bleeding   . Coagulopathy (HCC)   . Compression fracture    L1, fall in 05/2015  . Compression fracture of lumbar vertebra (HCC)   . GERD (gastroesophageal reflux disease)   . Hepatic steatosis   .  Hypertension   . Normal cardiac stress test 2008   myoview  . Stroke [facility]) 2008   left sided weakness  . Thrombocytopenia (HCC)     Past Surgical History:  Procedure Laterality Date  . APPENDECTOMY    . BIOPSY N/A 07/26/2015   Procedure: BIOPSY;  Surgeon: Corbin Ade, MD;  Location: [facility];  Service: Endoscopy;  Laterality: N/A;  Gastric, Esophageal  . COLONOSCOPY     ???  . ESOPHAGOGASTRODUODENOSCOPY  04/2007   RMR: tight peptic stricture status post dilation, antral erosions, superficial ulcerations, submucosal duodenal mass, gastric biopsy positive for H. pylori, duodenal biopsy benign, completed H. pylori treatment  . ESOPHAGOGASTRODUODENOSCOPY  07/2007   RMR: peptic stricture status post dilation, gastric erosions, 2 cm submucosal duodenal mass. Biopsies negative.referred for endoscopic ultrasound of submucosal duodenal mass but unclear whether this was completed.  . ESOPHAGOGASTRODUODENOSCOPY N/A 08/22/2016   Procedure: ESOPHAGOGASTRODUODENOSCOPY (EGD);  Surgeon: Napoleon Form, MD;  Location: [facility] ENDOSCOPY;  Service: Endoscopy;  Laterality: N/A;  . ESOPHAGOGASTRODUODENOSCOPY (EGD) WITH PROPOFOL N/A 07/26/2015   RMR: Abnoramal esophagus/short distal peptic stricture-status post dilation with scope passage- status post biopsy. No varices. Hiatal hernia. Abnormal stomach-status post biopsy. duodenal mass of uncertain significance.      Allergies  Allergies  Allergen Reactions  . Loratadine Rash    Inpatient Medications    . ampicillin-sulbactam (UNASYN) IV  3 g Intravenous Q8H  . folic acid  1  mg Intravenous Daily  . metoprolol tartrate  25 mg Oral BID  . pantoprazole  40 mg Oral Q0600  . phytonadione  10 mg Oral Daily  . potassium phosphate (monobasic)  500 mg Oral TID WC & HS  . thiamine  100 mg Intravenous Daily    Family History    Family History  Problem Relation Age of Onset  . Leukemia Mother   . Cirrhosis Paternal Uncle     etoh  . Colon cancer Neg Hx      Social History    Social History   Social History  . Marital status: Married    Spouse name: N/A  . Number of children: 2  . Years of education: N/A   Occupational History  . Not on file.   Social History Main Topics  . Smoking status: Current Every Day Smoker    Packs/day: 1.50    Years: 40.00    Types: Cigarettes  . Smokeless tobacco: Never Used  . Alcohol use 12.0 oz/week    20 Shots of liquor per week     Comment: quit etoh 05/23/15. used to drink 1/2 gallon vodka in two days   . Drug use: No  . Sexual activity: Not on file   Other Topics Concern  . Not on file   Social History Narrative  . No narrative on file     Review of Systems    General:  No chills, fever, night sweats or weight changes.  Cardiovascular:  No chest pain, dyspnea on exertion, edema, orthopnea, palpitations, paroxysmal nocturnal dyspnea. Dermatological: No rash, lesions/masses Respiratory: + cough, + dyspnea Urologic: No hematuria, dysuria Abdominal:   No nausea, vomiting, diarrhea, bright red blood per rectum, melena, or hematemesis Neurologic:  No visual changes, wkns, changes in mental status. All other systems reviewed and are otherwise negative except as noted above.  Physical Exam    Blood pressure 119/62, pulse (!) 107, temperature 98.4 F (36.9 C), temperature source Oral, resp. rate (!) 24, weight 139 lb 5.3 oz (63.2 kg), SpO2 95 %.  General: Pleasant, NAD Psych: Lethargic  Neuro: Confused intermittently . Moves all extremities spontaneously. HEENT: Normal, poor dentition   Neck: Supple without bruits or JVD. Lungs:  Resp regular and unlabored, diffuse rhonchi  Heart: RRR no s3, s4, or murmurs. Abdomen: Soft, non-tender, non-distended, BS + x 4.  Extremities: No clubbing, cyanosis or edema. DP/PT/Radials 2+ and equal bilaterally.  Labs    Troponin [facility](Point of Care Test)  Recent Labs  08/21/16 1247  TROPIPOC 1.11*    Recent Labs  08/21/16 1256 08/21/16 1302  08/21/16 2130 08/22/16 0405 08/23/16 0330  CKTOTAL  --  425*  --   --   --   TROPONINI 1.60*  --  1.66* 2.10* 2.19*   Lab Results  Component Value Date   WBC 7.8 08/23/2016   HGB 7.5 (L) 08/23/2016   HCT 23.3 (L) 08/23/2016   MCV 105.0 (H) 08/23/2016   PLT 89 (L) 08/23/2016    Recent Labs Lab 08/23/16 0200 08/23/16 0330  NA 139  --   K 3.1*  --   CL 107  --   CO2 25  --   BUN 24*  --   CREATININE 1.48*  --   CALCIUM 7.0*  --   PROT  --  6.3*  BILITOT  --  5.3*  ALKPHOS  --  76  ALT  --  45  AST  --  182*  GLUCOSE 151*  --  Lab Results  Component Value Date   CHOL 104 07/24/2013   HDL 4 (L) 07/24/2013   LDLCALC 71 07/24/2013   TRIG 147 07/24/2013   Lab Results  Component Value Date   DDIMER 0.91 (H) 10/09/2014     Radiology Studies    Dg Chest 1 View  Result Date: 08/21/2016 CLINICAL DATA:  Fall, left hip pain EXAM: CHEST 1 VIEW COMPARISON:  06/25/2015 FINDINGS: Cardiomediastinal silhouette is stable. No infiltrate or pleural effusion. No pulmonary edema. Minimal thoracic dextroscoliosis. IMPRESSION: No active disease. Electronically Signed   By: Natasha MeadLiviu  Pop M.D.   On: 08/21/2016 13:54   Ct Head Wo Contrast  Result Date: 08/21/2016 CLINICAL DATA:  Fall and altered mental status EXAM: CT HEAD WITHOUT CONTRAST CT CERVICAL SPINE WITHOUT CONTRAST TECHNIQUE: Multidetector CT imaging of the head and cervical spine was performed following the standard protocol without intravenous contrast. Multiplanar CT image reconstructions of the cervical spine were also generated. COMPARISON:  Head CT 05/25/2015 and 03/24/2010. FINDINGS: CT HEAD FINDINGS Brain: No mass lesion, intraparenchymal hemorrhage or extra-axial collection. No evidence of acute cortical infarct. There is encephalomalacia within the left frontal lobe at the site of prior MCA infarct. There is mildly age advanced volume loss. No hydrocephalus. Vascular: No hyperdense vessel or unexpected calcification. Skull:  Normal visualized skull base, calvarium and extracranial soft tissues. Sinuses/Orbits: No sinus fluid levels or advanced mucosal thickening. No mastoid effusion. Normal orbits. CT CERVICAL SPINE FINDINGS Alignment: No static subluxation. Facets are aligned. Occipital condyles are normally positioned. Reversal of the normal cervical lordosis. Minimal C5-C6 anterolisthesis slightly increased relative to 03/24/2010, possibly positional. Skull base and vertebrae: No acute fracture. Soft tissues and spinal canal: No prevertebral fluid or swelling. No visible canal hematoma. Disc levels: No advanced spinal canal or neural foraminal stenosis. Upper chest: No pneumothorax, pulmonary nodule or pleural effusion. Other: Normal visualized paraspinal cervical soft tissues. IMPRESSION: 1. No acute intracranial abnormality. 2. No acute fracture of the cervical spine. 3. 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 Electronically Signed   By: Deatra RobinsonKevin  Herman M.D.   On: 08/21/2016 14:40   Ct Cervical Spine Wo Contrast  Result Date: 08/21/2016 CLINICAL DATA:  Fall and altered mental status EXAM: CT HEAD WITHOUT CONTRAST CT CERVICAL SPINE WITHOUT CONTRAST TECHNIQUE: Multidetector CT imaging of the head and cervical spine was performed following the standard protocol without intravenous contrast. Multiplanar CT image reconstructions of the cervical spine were also generated. COMPARISON:  Head CT 05/25/2015 and 03/24/2010. FINDINGS: CT HEAD FINDINGS Brain: No mass lesion, intraparenchymal hemorrhage or extra-axial collection. No evidence of acute cortical infarct. There is encephalomalacia within the left frontal lobe at the site of prior MCA infarct. There is mildly age advanced volume loss. No hydrocephalus. Vascular: No hyperdense vessel or unexpected calcification. Skull: Normal visualized skull base, calvarium and extracranial soft tissues. Sinuses/Orbits: No sinus fluid levels or advanced mucosal  thickening. No mastoid effusion. Normal orbits. CT CERVICAL SPINE FINDINGS Alignment: No static subluxation. Facets are aligned. Occipital condyles are normally positioned. Reversal of the normal cervical lordosis. Minimal C5-C6 anterolisthesis slightly increased relative to 03/24/2010, possibly positional. Skull base and vertebrae: No acute fracture. Soft tissues and spinal canal: No prevertebral fluid or swelling. No visible canal hematoma. Disc levels: No advanced spinal canal or neural foraminal stenosis. Upper chest: No pneumothorax, pulmonary nodule or pleural effusion. Other: Normal visualized paraspinal cervical soft tissues. IMPRESSION: 1. No acute intracranial abnormality. 2. No acute fracture of the cervical spine. 3.  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 Electronically Signed   By: Deatra Robinson M.D.   On: 08/21/2016 14:40   Dg Chest Port 1 View  Result Date: 08/22/2016 CLINICAL DATA:  Shortness of breath.  Respiratory failure. EXAM: PORTABLE CHEST 1 VIEW COMPARISON:  Single-view of the chest 08/21/2016. PA and lateral chest 06/25/2015. FINDINGS: Right IJ catheter is again seen with the tip projecting near the superior cavoatrial junction. Patchy airspace opacity is seen throughout the left chest the right lung is clear. No pneumothorax or pleural effusion. Heart size is upper normal. IMPRESSION: Patchy airspace opacity throughout the left chest worrisome for pneumonia. Electronically Signed   By: Drusilla Kanner M.D.   On: 08/22/2016 07:44   Dg Chest Portable 1 View  Result Date: 08/21/2016 CLINICAL DATA:  Central line placement. EXAM: PORTABLE CHEST 1 VIEW COMPARISON:  Radiograph of same day. FINDINGS: The heart size and mediastinal contours are within normal limits. Both lungs are clear. The visualized skeletal structures are unremarkable. Interval placement of right internal jugular catheter with distal tip in expected position of cavoatrial junction.  No pneumothorax or pleural effusion is noted. IMPRESSION: Interval placement of right internal jugular catheter with distal tip in expected position of cavoatrial junction. No pneumothorax is noted. Electronically Signed   By: Lupita Raider, M.D.   On: 08/21/2016 15:01   Dg Hip Unilat W Or Wo Pelvis 2-3 Views Left  Result Date: 08/21/2016 CLINICAL DATA:  Left hip pain since a fall 08/18/2016. Patient found down 3 days later. EXAM: DG HIP (WITH OR WITHOUT PELVIS) 2-3V LEFT COMPARISON:  None. FINDINGS: Acute left intertrochanteric fracture is identified. Left femoral head is located. No other acute abnormality. IMPRESSION: Acute left intertrochanteric fracture. Electronically Signed   By: Drusilla Kanner M.D.   On: 08/21/2016 13:54    EKG & Cardiac Imaging    EKG: Sinus tachycardia, nonspecific anterolateral ST changes in anterolateral leads.  Echocardiogram: 08/22/16 Study Conclusions  - Left ventricle: The cavity size was normal. Wall thickness was   increased in a pattern of moderate LVH. Systolic function was   vigorous. The estimated ejection fraction was in the range of 65%   to 70%. Wall motion was normal; there were no regional wall   motion abnormalities. Doppler parameters are consistent with   abnormal left ventricular relaxation (grade 1 diastolic   dysfunction). - Pulmonary arteries: Systolic pressure was mildly increased. PA   peak pressure: 32 mm Hg (S).  Assessment & Plan    1. Elevated troponin/ Pre operative evaluation of cardiac risk : Likely due to demand ischemia in the setting of sepsis and dehydration. Patient does have risk factors for CAD, but no acute ST changes seen on EKG.   Her daughter tells me that a typical day for her mom includes waking up at noon, drinking about a gallon of liquor, taking a nap at 3, then waking up to drink more before she passes out. She does not eat, and does not often leave the house. She is able to walk around and do laundry, but  that is about it.   The patient is confused, when I ask her about chest pain she denies it, but hard to differentiate her responses. Her daughter tells me that her mom had a stress test in 2004, which I can see. It was low risk.   With her ongoing anemia and thrombocytopenia, she is not a candidate for a heart cath, if she required  stenting she is certainly not a good candidate for dual antiplatelet therapy. She also has cirrhosis of the liver.   She has a broken hip that would require surgical intervention, I think that there is a high risk of cardiac complications given all of her co morbities. It is reassuring that her EF if normal and there are no wall motion abnormalities on her echo.   Further MD recommendations to follow.   Signed, Little Ishikawa, NP 08/23/2016, 9:50 AM Pager: [phone]   I have seen, examined and evaluated the patient this AM along with Suzzette Righter,.  After reviewing all the available data and chart, we discussed the patients laboratory, study & physical findings as well as symptoms in detail. I agree with her findings, examination as well as impression recommendations as per our discussion.    Unfortunate situation with a woman who appears much older than her stated age with long-term follow-up venous drinking essentially 1.5 gallons of liquor a day. Found minimally responsive in her bed several days after a fall. She suffered a fracture. Not sure what the reason for the fall was, not sure if any symptoms occurred. She denies having had any antecedent chest tightness or pressure.  For some reason as part of ER evaluation, Troponin levels were checked -EKG may be suggested subtle lateral ST depressions.  No other clinical indicators to suspect that the patient had ACS. Echocardiogram performed shows preserved cardiac function with no regional wall motion abnormalities.  We are asked to consult based on the elevated troponin for preoperative evaluation for relatively  urgent hip ORIF planned either later on today or tomorrow.  Not much we can do for risk modification due to the urgent nature of the surgery. I would not pursue any further cardiac evaluation that would delay the surgery.  She does not have signs of acute coronary syndrome as preserved EF. She would not be a good candidate for any invasive cardiac evaluation ever given the amount of alcohol she drinks and the underlying coagulopathy with portal hypertensive gastropathy, recovering acute renal insufficiency and anemia. She would not be a candidate for antiplatelet agents, and does not show signs of being on a commitment to taking any medication regimen.   As a result, I think the best course of action will be to proceed with the planned surgery which is a low risk surgery from a cardiac standpoint.    PREOPERATIVE CARDIAC RISK ASSESSMENT   Revised Cardiac Risk Index:  High Risk Surgery: no;   Defined as Intraperitoneal, intrathoracic or suprainguinal vascular  Active CAD: unknown -- + Troponin in the setting of prolonged down time, AKI & severe anemia;   CHF: no;   Cerebrovascular Disease: yes;   Diabetes: no; On Insulin: no  CKD (Cr >~ 2): no;   Total: 1-2 Estimated Risk of Adverse Outcome: LOW to INTERMEDIATE (if + Troponin is c/w active CAD)  Estimated Risk of MI, PE, VF/VT (Cardiac Arrest), Complete Heart Block: ~1-6.6 % (more c/w ~1)  -- Would recommend proceeding to the OR with caution based on her alcohol history, anemia, renal insufficiency.  Would not delay for further cardiac evaluation  I am more concerned about her potential for alcohol withdrawal DTs man concern for adverse cardiac outcome. - Would strongly consider CIWA protocol -> puts her at risk for arrhythmia such as A. fib RVR.  We will follow along peripherally postoperatively. If she remains stable will likely sign off.   Bryan Lemma, M.D., M.S. Interventional Cardiologist  Pager # [phone] Phone #  [phone] [address] [place], [place] [address]

## 2016-08-23 NOTE — Progress Notes (Addendum)
PROGRESS NOTE    [name]  OZH:[id number] DOB: [birth year] DOA: 08/21/2016 PCP: Vertis KelchALLEN, DEBRA, NP  Brief Narrative PCCM Transfer 12/13 60/F with Alcoholism and tobacco abuse, She presented to [facility] ED 12/11 after her son found her in her home, lying in her bed covered in feces.  She apparently had a mechanical fall 3 days prior. Imaging in ED revealed intertrochanteric hip fx.  She also had positive FOBT and reported that she had been experiencing hematemesis as well as melena, long standing hx of significant ETOH intake (she reports "three 1/2 gallons of liquor per day). Admitted to ICU per PCCM, had EGD: unremarkable, portal gastropathy, given 2U PRBC, Troponin 2, [facility]  Assessment & Plan:   Active Problems:   Anemia/Upper GI bleed -admitted with Hb of 5.8 in background of chronic alcoholism -s/p 2units PRBC and 2units FFP -EGD with portal gastropathy, no varices and no active bleeding -continue Po PPI now -will transfuse another unit of PRBC    Left hip intertrochanteric fracture  -Ortho consulting -tentatively Ortho planning surgery later today, but troponin is 2 -ECHO with normal EF and wall motion -asked Cards to evaluate -added Low dose BB -suspect she is intermediate risk for intermediate risk surgery  Elevated troponin -likely due to demand from severe anemia, no known CAD -peaked at 2 -ECHO with normal Wall motion and EF, will ask Cards for input given need for OR this admission -add Coreg  Alcoholism -long standing -continue thiamine, monitor for CIWA -per daughter 35-40years  Aspiration pneumonia -continue unasyn -FU cultures, aspiration precautions  Chronic liver failure with coagulopathy/thrombocytopenia -due to alcohol -FFP today, monitor  AKi  -baseline creatinine normal -monitor  DVT prophylaxis:SCDS, INR up Code Status:Full Code Family Communication:daughter at bedside Disposition  Plan: Keep in SDU  Consultants:   GI  Ortho  PCCM  Cards   Procedures:ECHO   Antimicrobials: Unasyn D3   Subjective: Sleepy, no complaints, coughing some, no chest pain or dyspnea  Objective: Vitals:   08/22/16 2000 08/23/16 0000 08/23/16 0300 08/23/16 0400  BP: 116/65 114/70 119/69 124/70  Pulse: (!) 103 94 (!) 102 (!) 110  Resp: (!) 33  19 (!) 27  Temp: 98.7 F (37.1 C) 98.6 F (37 C) 98.4 F (36.9 C)   TempSrc: Oral Oral Oral   SpO2: 97% 97% 95% 97%  Weight:    63.2 kg (139 lb 5.3 oz)    Intake/Output Summary (Last 24 hours) at 08/23/16 0819 Last data filed at 08/23/16 0600  Gross per 24 hour  Intake          1834.83 ml  Output              463 ml  Net          1371.83 ml   Filed Weights   08/22/16 0800 08/23/16 0400  Weight: 57.6 kg (126 lb 15.8 oz) 63.2 kg (139 lb 5.3 oz)    Examination:  General exam: Appears calm and comfortable  Respiratory system: Clear to auscultation. Respiratory effort normal. Cardiovascular system: S1 & S2 heard, RRR. No JVD, murmurs, rubs, gallops or clicks. No pedal edema. Gastrointestinal system: Abdomen is nondistended, soft and nontender. No organomegaly or masses felt. Normal bowel sounds heard. Central nervous system: Alert and oriented. No focal neurological deficits. Extremities: Symmetric 5 x 5 power. Skin: No rashes, lesions or ulcers Psychiatry: Judgement and insight appear normal. Mood & affect appropriate.  Data Reviewed: I have personally reviewed following labs and imaging studies  CBC:  Recent Labs Lab 08/21/16 1249 08/21/16 2130 08/22/16 0100 08/22/16 1500 08/23/16 0330  WBC 16.2*  --  14.7* 10.3 7.8  NEUTROABS 13.6*  --   --   --  5.6  HGB 6.3* 8.6* 8.2* 7.8* 7.5*  HCT 21.1* 27.1* 25.5* 24.1* 23.3*  MCV 133.5*  --  105.8* 105.2* 105.0*  PLT 86*  --  46* 112* 89*   Basic Metabolic Panel:  Recent Labs Lab 08/21/16 2130 08/22/16 0100 08/22/16 0640 08/22/16 1500 08/23/16 0200  08/23/16 0330  NA 135 135 137 139 139  --   K 3.0* 3.1* 3.8 3.5 3.1*  --   CL 99* 102 104 107 107  --   CO2 21* 25 23 23 25   --   GLUCOSE 119* 104* 102* 152* 151*  --   BUN 14 14 14 18  24*  --   CREATININE 1.93* 1.84* 1.70* 1.61* 1.48*  --   CALCIUM 7.1* 6.9* 6.8* 7.0* 7.0*  --   MG  --  1.4* 2.1  --   --  2.1  PHOS  --  2.4*  --   --   --  <1.0*   GFR: Estimated Creatinine Clearance: 38.8 mL/min (by C-G formula based on SCr of 1.48 mg/dL (H)). Liver Function Tests:  Recent Labs Lab 08/21/16 1249 08/22/16 0405 08/23/16 0330  AST 221* 205* 182*  ALT 54 42 45  ALKPHOS 92 61 76  BILITOT 8.1* 6.9* 5.3*  PROT 6.7 5.9* 6.3*  ALBUMIN 2.1* 1.8* 1.8*    Recent Labs Lab 08/21/16 1249  LIPASE 56*    Recent Labs Lab 08/21/16 1249 08/22/16 0405  AMMONIA 64* 50*   Coagulation Profile:  Recent Labs Lab 08/21/16 1249 08/23/16 0330  INR 3.24 2.18   Cardiac Enzymes:  Recent Labs Lab 08/21/16 1256 08/21/16 1302 08/21/16 2130 08/22/16 0405 08/23/16 0330  CKTOTAL  --  425*  --   --   --   TROPONINI 1.60*  --  1.66* 2.10* 2.19*   BNP (last 3 results) No results for input(s): PROBNP in the last 8760 hours. HbA1C: No results for input(s): HGBA1C in the last 72 hours. CBG:  Recent Labs Lab 08/21/16 1203 08/21/16 1750 08/21/16 2031  GLUCAP 129* 132* 141*   Lipid Profile: No results for input(s): CHOL, HDL, LDLCALC, TRIG, CHOLHDL, LDLDIRECT in the last 72 hours. Thyroid Function Tests: No results for input(s): TSH, T4TOTAL, FREET4, T3FREE, THYROIDAB in the last 72 hours. Anemia Panel: No results for input(s): VITAMINB12, FOLATE, FERRITIN, TIBC, IRON, RETICCTPCT in the last 72 hours. Urine analysis:    Component Value Date/Time   COLORURINE AMBER (A) 08/21/2016 1239   APPEARANCEUR CLOUDY (A) 08/21/2016 1239   LABSPEC 1.025 08/21/2016 1239   PHURINE 5.5 08/21/2016 1239   GLUCOSEU 100 (A) 08/21/2016 1239   HGBUR NEGATIVE 08/21/2016 1239   BILIRUBINUR  MODERATE (A) 08/21/2016 1239   KETONESUR NEGATIVE 08/21/2016 1239   PROTEINUR 30 (A) 08/21/2016 1239   UROBILINOGEN 2.0 (H) 06/25/2015 1746   NITRITE POSITIVE (A) 08/21/2016 1239   LEUKOCYTESUR TRACE (A) 08/21/2016 1239   Sepsis Labs: @LABRCNTIP (procalcitonin:4,lacticidven:4)  ) Recent Results (from the past 240 hour(s))  Blood culture (routine x 2)     Status: None (Preliminary result)   Collection Time: 08/21/16 12:49 PM  Result Value Ref Range Status   Specimen Description BLOOD RIGHT FOREARM  Final   Special Requests BOTTLES DRAWN AEROBIC AND  ANAEROBIC [facility] EACH  Final   Culture PENDING  Incomplete   Report Status PENDING  Incomplete  Blood culture (routine x 2)     Status: None (Preliminary result)   Collection Time: 08/21/16 12:55 PM  Result Value Ref Range Status   Specimen Description LEFT ANTECUBITAL  Final   Special Requests BOTTLES DRAWN AEROBIC AND ANAEROBIC [facility] EACH  Final   Culture PENDING  Incomplete   Report Status PENDING  Incomplete  Culture, group A strep     Status: None (Preliminary result)   Collection Time: 08/21/16  6:55 PM  Result Value Ref Range Status   Specimen Description THROAT  Final   Special Requests NONE  Final   Culture CULTURE REINCUBATED FOR BETTER GROWTH  Final   Report Status PENDING  Incomplete         Radiology Studies: Dg Chest 1 View  Result Date: 08/21/2016 CLINICAL DATA:  Fall, left hip pain EXAM: CHEST 1 VIEW COMPARISON:  06/25/2015 FINDINGS: Cardiomediastinal silhouette is stable. No infiltrate or pleural effusion. No pulmonary edema. Minimal thoracic dextroscoliosis. IMPRESSION: No active disease. Electronically Signed   By: Natasha Mead M.D.   On: 08/21/2016 13:54   Ct Head Wo Contrast  Result Date: 08/21/2016 CLINICAL DATA:  Fall and altered mental status EXAM: CT HEAD WITHOUT CONTRAST CT CERVICAL SPINE WITHOUT CONTRAST TECHNIQUE: Multidetector CT imaging of the head and cervical spine was performed following the standard  protocol without intravenous contrast. Multiplanar CT image reconstructions of the cervical spine were also generated. COMPARISON:  Head CT 05/25/2015 and 03/24/2010. FINDINGS: CT HEAD FINDINGS Brain: No mass lesion, intraparenchymal hemorrhage or extra-axial collection. No evidence of acute cortical infarct. There is encephalomalacia within the left frontal lobe at the site of prior MCA infarct. There is mildly age advanced volume loss. No hydrocephalus. Vascular: No hyperdense vessel or unexpected calcification. Skull: Normal visualized skull base, calvarium and extracranial soft tissues. Sinuses/Orbits: No sinus fluid levels or advanced mucosal thickening. No mastoid effusion. Normal orbits. CT CERVICAL SPINE FINDINGS Alignment: No static subluxation. Facets are aligned. Occipital condyles are normally positioned. Reversal of the normal cervical lordosis. Minimal C5-C6 anterolisthesis slightly increased relative to 03/24/2010, possibly positional. Skull base and vertebrae: No acute fracture. Soft tissues and spinal canal: No prevertebral fluid or swelling. No visible canal hematoma. Disc levels: No advanced spinal canal or neural foraminal stenosis. Upper chest: No pneumothorax, pulmonary nodule or pleural effusion. Other: Normal visualized paraspinal cervical soft tissues. IMPRESSION: 1. No acute intracranial abnormality. 2. No acute fracture of the cervical spine. 3. 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 Electronically Signed   By: Deatra Robinson M.D.   On: 08/21/2016 14:40   Ct Cervical Spine Wo Contrast  Result Date: 08/21/2016 CLINICAL DATA:  Fall and altered mental status EXAM: CT HEAD WITHOUT CONTRAST CT CERVICAL SPINE WITHOUT CONTRAST TECHNIQUE: Multidetector CT imaging of the head and cervical spine was performed following the standard protocol without intravenous contrast. Multiplanar CT image reconstructions of the cervical spine were also generated. COMPARISON:   Head CT 05/25/2015 and 03/24/2010. FINDINGS: CT HEAD FINDINGS Brain: No mass lesion, intraparenchymal hemorrhage or extra-axial collection. No evidence of acute cortical infarct. There is encephalomalacia within the left frontal lobe at the site of prior MCA infarct. There is mildly age advanced volume loss. No hydrocephalus. Vascular: No hyperdense vessel or unexpected calcification. Skull: Normal visualized skull base, calvarium and extracranial soft tissues. Sinuses/Orbits: No sinus fluid levels or advanced mucosal thickening. No mastoid  effusion. Normal orbits. CT CERVICAL SPINE FINDINGS Alignment: No static subluxation. Facets are aligned. Occipital condyles are normally positioned. Reversal of the normal cervical lordosis. Minimal C5-C6 anterolisthesis slightly increased relative to 03/24/2010, possibly positional. Skull base and vertebrae: No acute fracture. Soft tissues and spinal canal: No prevertebral fluid or swelling. No visible canal hematoma. Disc levels: No advanced spinal canal or neural foraminal stenosis. Upper chest: No pneumothorax, pulmonary nodule or pleural effusion. Other: Normal visualized paraspinal cervical soft tissues. IMPRESSION: 1. No acute intracranial abnormality. 2. No acute fracture of the cervical spine. 3. 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 Electronically Signed   By: Deatra RobinsonKevin  Herman M.D.   On: 08/21/2016 14:40   Dg Chest Port 1 View  Result Date: 08/22/2016 CLINICAL DATA:  Shortness of breath.  Respiratory failure. EXAM: PORTABLE CHEST 1 VIEW COMPARISON:  Single-view of the chest 08/21/2016. PA and lateral chest 06/25/2015. FINDINGS: Right IJ catheter is again seen with the tip projecting near the superior cavoatrial junction. Patchy airspace opacity is seen throughout the left chest the right lung is clear. No pneumothorax or pleural effusion. Heart size is upper normal. IMPRESSION: Patchy airspace opacity throughout the left chest  worrisome for pneumonia. Electronically Signed   By: Drusilla Kannerhomas  Dalessio M.D.   On: 08/22/2016 07:44   Dg Chest Portable 1 View  Result Date: 08/21/2016 CLINICAL DATA:  Central line placement. EXAM: PORTABLE CHEST 1 VIEW COMPARISON:  Radiograph of same day. FINDINGS: The heart size and mediastinal contours are within normal limits. Both lungs are clear. The visualized skeletal structures are unremarkable. Interval placement of right internal jugular catheter with distal tip in expected position of cavoatrial junction. No pneumothorax or pleural effusion is noted. IMPRESSION: Interval placement of right internal jugular catheter with distal tip in expected position of cavoatrial junction. No pneumothorax is noted. Electronically Signed   By: Lupita RaiderJames  Green Jr, M.D.   On: 08/21/2016 15:01   Dg Hip Unilat W Or Wo Pelvis 2-3 Views Left  Result Date: 08/21/2016 CLINICAL DATA:  Left hip pain since a fall 08/18/2016. Patient found down 3 days later. EXAM: DG HIP (WITH OR WITHOUT PELVIS) 2-3V LEFT COMPARISON:  None. FINDINGS: Acute left intertrochanteric fracture is identified. Left femoral head is located. No other acute abnormality. IMPRESSION: Acute left intertrochanteric fracture. Electronically Signed   By: Drusilla Kannerhomas  Dalessio M.D.   On: 08/21/2016 13:54        Scheduled Meds: . sodium chloride   Intravenous Once  . ampicillin-sulbactam (UNASYN) IV  3 g Intravenous Q8H  . folic acid  1 mg Intravenous Daily  . metoprolol tartrate  25 mg Oral BID  . pantoprazole  40 mg Oral Q0600  . phytonadione  10 mg Oral Daily  . potassium phosphate (monobasic)  500 mg Oral TID WC & HS  . thiamine  100 mg Intravenous Daily   Continuous Infusions:   LOS: 2 days    Time spent: 45min    Zannie CovePreetha Beauregard Jarrells, MD [facility] Pager [phone]  If 7PM-7AM, please contact night-coverage [email or web address] Password [email or web address] 08/23/2016, 8:19 AM

## 2016-08-23 NOTE — Anesthesia Procedure Notes (Signed)
Procedure Name: Intubation Date/Time: 08/23/2016 9:38 PM Performed by: Molli HazardGORDON, [name]-anesthesia Checklist: Patient identified, Emergency Drugs available, Suction available and Patient being monitored Patient Re-evaluated:Patient Re-evaluated prior to inductionOxygen Delivery Method: Circle system utilized Preoxygenation: Valentine-oxygenation with 100% oxygen Intubation Type: IV induction, Cricoid Pressure applied and Rapid sequence Laryngoscope Size: Miller and 2 Grade View: Grade II Tube type: Oral Tube size: 7.5 mm Number of attempts: 1 Airway Equipment and Method: Stylet Placement Confirmation: ETT inserted through vocal cords under direct vision,  positive ETCO2 and breath sounds checked- equal and bilateral Secured at: 22 cm Tube secured with: Tape Dental Injury: Teeth and Oropharynx as per Valentine-operative assessment

## 2016-08-23 NOTE — Progress Notes (Signed)
   Met w/ family (daughter) at [place].  Provided emotional support.  Prayed w/ family.  Will follow, as needed.  - Rev. DISH MDiv ThM

## 2016-08-23 NOTE — Progress Notes (Signed)
plt 89 k - hgb 7.5 - inr 2.1 Would be better for inr to be lower before surgery with plt issues Possible or tonight vs am Npo after noon recheck inr at 1800 today

## 2016-08-23 NOTE — Progress Notes (Signed)
Called to patient's room by daughter patient nauseated and vomiting, RN also noticed patient increased work of breathing, and wet sounding cough which was a change from am assessment. MD Jomarie LongsJoseph notified of patient changes. Zofran ordered for nausea and lasix IV ordered for increased wet lung sounds. Will continue to monitor patient.

## 2016-08-23 NOTE — Anesthesia Preprocedure Evaluation (Signed)
Anesthesia Evaluation    Airway        Dental   Pulmonary Current Smoker,           Cardiovascular hypertension,      Neuro/Psych    GI/Hepatic   Endo/Other    Renal/GU      Musculoskeletal   Abdominal   Peds  Hematology   Anesthesia Other Findings   Reproductive/Obstetrics                             Anesthesia Physical Anesthesia Plan  ASA: IV  Anesthesia Plan: General   Post-op Pain Management:    Induction: Intravenous  Airway Management Planned: Oral ETT  Additional Equipment:   Intra-op Plan:   Post-operative Plan:   Informed Consent: I have reviewed the patients History and Physical, chart, labs and discussed the procedure including the risks, benefits and alternatives for the proposed anesthesia with the patient or authorized representative who has indicated his/her understanding and acceptance.     Plan Discussed with: CRNA and Anesthesiologist  Anesthesia Plan Comments:         Anesthesia Quick Evaluation

## 2016-08-23 NOTE — Anesthesia Preprocedure Evaluation (Incomplete Revision)
Anesthesia Evaluation  Patient identified by MRN, date of birth, ID band Patient awake  General Assessment Comment:lethargic  Airway Mallampati: III  TM Distance: <3 FB   Mouth opening: Limited Mouth Opening  Dental  (+) Edentulous Upper, Dental Advisory Given   Pulmonary Current Smoker,           Cardiovascular hypertension,      Neuro/Psych    GI/Hepatic   Endo/Other    Renal/GU      Musculoskeletal   Abdominal   Peds  Hematology   Anesthesia Other Findings   Reproductive/Obstetrics                            Anesthesia Physical Anesthesia Plan  ASA: IV  Anesthesia Plan: General   Post-op Pain Management:    Induction: Intravenous  Airway Management Planned: Oral ETT  Additional Equipment:   Intra-op Plan:   Post-operative Plan:   Informed Consent: I have reviewed the patients History and Physical, chart, labs and discussed the procedure including the risks, benefits and alternatives for the proposed anesthesia with the patient or authorized representative who has indicated his/her understanding and acceptance.     Plan Discussed with: CRNA, Anesthesiologist and Surgeon  Anesthesia Plan Comments:        Anesthesia Quick Evaluation

## 2016-08-23 NOTE — Progress Notes (Signed)
Daily Rounding Note  08/23/2016, 12:19 PM  LOS: 2 days   SUBJECTIVE:   Chief complaint:    Emesis today was streaks of brown in o/w clear emesis.  Stools dark brown.  Lots of hip pain  OBJECTIVE:         Vital signs in last 24 hours:    Temp:  [98.1 F (36.7 C)-98.7 F (37.1 C)] 98.4 F (36.9 C) (12/13 0917) Pulse Rate:  [87-110] 107 (12/13 0800) Resp:  [18-33] 24 (12/13 0800) BP: (102-133)/(60-77) 119/62 (12/13 0917) SpO2:  [92 %-97 %] 95 % (12/13 0800) Weight:  [63.2 kg (139 lb 5.3 oz)] 63.2 kg (139 lb 5.3 oz) (12/13 0400) Last BM Date: 08/22/16 Filed Weights   08/22/16 0800 08/23/16 0400  Weight: 57.6 kg (126 lb 15.8 oz) 63.2 kg (139 lb 5.3 oz)   General: looks awful, jaundiced/sallow   Heart: RRR Chest: weak, mucoid, ineffective cough.  Mouth breathing Abdomen: soft, NT, ND.  Active BS  Extremities: + edema Neuro/Psych:  Somnolent, arousable following morphine.  Did not attempt to test her orientation but she follows simple commands.   Intake/Output from previous day: 12/12 0701 - 12/13 0700 In: 2009.8 [I.V.:1475.8; Blood:434; IV Piggyback:100] Out: 463 [Urine:460; Stool:1; Blood:2]  Intake/Output this shift: No intake/output data recorded.  Lab Results:  Recent Labs  08/22/16 0100 08/22/16 1500 08/23/16 0330  WBC 14.7* 10.3 7.8  HGB 8.2* 7.8* 7.5*  HCT 25.5* 24.1* 23.3*  PLT 46* 112* 89*   BMET  Recent Labs  08/22/16 0640 08/22/16 1500 08/23/16 0200  NA 137 139 139  K 3.8 3.5 3.1*  CL 104 107 107  CO2 23 23 25   GLUCOSE 102* 152* 151*  BUN 14 18 24*  CREATININE 1.70* 1.61* 1.48*  CALCIUM 6.8* 7.0* 7.0*   LFT  Recent Labs  08/21/16 1249 08/22/16 0405 08/23/16 0330  PROT 6.7 5.9* 6.3*  ALBUMIN 2.1* 1.8* 1.8*  AST 221* 205* 182*  ALT 54 42 45  ALKPHOS 92 61 76  BILITOT 8.1* 6.9* 5.3*  BILIDIR  --  3.7* 2.9*  IBILI  --  3.2* 2.4*   PT/INR  Recent Labs  08/21/16 1249  08/23/16 0330  LABPROT 33.8* 24.6*  INR 3.24 2.18   Hepatitis Panel No results for input(s): HEPBSAG, HCVAB, HEPAIGM, HEPBIGM in the last 72 hours.  Studies/Results: Dg Chest 1 View  Result Date: 08/21/2016 CLINICAL DATA:  Fall, left hip pain EXAM: CHEST 1 VIEW COMPARISON:  06/25/2015 FINDINGS: Cardiomediastinal silhouette is stable. No infiltrate or pleural effusion. No pulmonary edema. Minimal thoracic dextroscoliosis. IMPRESSION: No active disease. Electronically Signed   By: Natasha MeadLiviu  Pop M.D.   On: 08/21/2016 13:54   Ct Head Wo Contrast  Result Date: 08/21/2016 CLINICAL DATA:  Fall and altered mental status EXAM: CT HEAD WITHOUT CONTRAST CT CERVICAL SPINE WITHOUT CONTRAST TECHNIQUE: Multidetector CT imaging of the head and cervical spine was performed following the standard protocol without intravenous contrast. Multiplanar CT image reconstructions of the cervical spine were also generated. COMPARISON:  Head CT 05/25/2015 and 03/24/2010. FINDINGS: CT HEAD FINDINGS Brain: No mass lesion, intraparenchymal hemorrhage or extra-axial collection. No evidence of acute cortical infarct. There is encephalomalacia within the left frontal lobe at the site of prior MCA infarct. There is mildly age advanced volume loss. No hydrocephalus. Vascular: No hyperdense vessel or unexpected calcification. Skull: Normal visualized skull base, calvarium and extracranial soft tissues. Sinuses/Orbits: No sinus fluid levels or advanced mucosal  thickening. No mastoid effusion. Normal orbits. CT CERVICAL SPINE FINDINGS Alignment: No static subluxation. Facets are aligned. Occipital condyles are normally positioned. Reversal of the normal cervical lordosis. Minimal C5-C6 anterolisthesis slightly increased relative to 03/24/2010, possibly positional. Skull base and vertebrae: No acute fracture. Soft tissues and spinal canal: No prevertebral fluid or swelling. No visible canal hematoma. Disc levels: No advanced spinal canal or  neural foraminal stenosis. Upper chest: No pneumothorax, pulmonary nodule or pleural effusion. Other: Normal visualized paraspinal cervical soft tissues. IMPRESSION: 1. No acute intracranial abnormality. 2. No acute fracture of the cervical spine. 3. 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 Electronically Signed   By: Deatra Robinson M.D.   On: 08/21/2016 14:40   Ct Cervical Spine Wo Contrast  Result Date: 08/21/2016 CLINICAL DATA:  Fall and altered mental status EXAM: CT HEAD WITHOUT CONTRAST CT CERVICAL SPINE WITHOUT CONTRAST TECHNIQUE: Multidetector CT imaging of the head and cervical spine was performed following the standard protocol without intravenous contrast. Multiplanar CT image reconstructions of the cervical spine were also generated. COMPARISON:  Head CT 05/25/2015 and 03/24/2010. FINDINGS: CT HEAD FINDINGS Brain: No mass lesion, intraparenchymal hemorrhage or extra-axial collection. No evidence of acute cortical infarct. There is encephalomalacia within the left frontal lobe at the site of prior MCA infarct. There is mildly age advanced volume loss. No hydrocephalus. Vascular: No hyperdense vessel or unexpected calcification. Skull: Normal visualized skull base, calvarium and extracranial soft tissues. Sinuses/Orbits: No sinus fluid levels or advanced mucosal thickening. No mastoid effusion. Normal orbits. CT CERVICAL SPINE FINDINGS Alignment: No static subluxation. Facets are aligned. Occipital condyles are normally positioned. Reversal of the normal cervical lordosis. Minimal C5-C6 anterolisthesis slightly increased relative to 03/24/2010, possibly positional. Skull base and vertebrae: No acute fracture. Soft tissues and spinal canal: No prevertebral fluid or swelling. No visible canal hematoma. Disc levels: No advanced spinal canal or neural foraminal stenosis. Upper chest: No pneumothorax, pulmonary nodule or pleural effusion. Other: Normal visualized paraspinal  cervical soft tissues. IMPRESSION: 1. No acute intracranial abnormality. 2. No acute fracture of the cervical spine. 3. 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 Electronically Signed   By: Deatra Robinson M.D.   On: 08/21/2016 14:40   Dg Chest Port 1 View  Result Date: 08/22/2016 CLINICAL DATA:  Shortness of breath.  Respiratory failure. EXAM: PORTABLE CHEST 1 VIEW COMPARISON:  Single-view of the chest 08/21/2016. PA and lateral chest 06/25/2015. FINDINGS: Right IJ catheter is again seen with the tip projecting near the superior cavoatrial junction. Patchy airspace opacity is seen throughout the left chest the right lung is clear. No pneumothorax or pleural effusion. Heart size is upper normal. IMPRESSION: Patchy airspace opacity throughout the left chest worrisome for pneumonia. Electronically Signed   By: Drusilla Kanner M.D.   On: 08/22/2016 07:44   Dg Chest Portable 1 View  Result Date: 08/21/2016 CLINICAL DATA:  Central line placement. EXAM: PORTABLE CHEST 1 VIEW COMPARISON:  Radiograph of same day. FINDINGS: The heart size and mediastinal contours are within normal limits. Both lungs are clear. The visualized skeletal structures are unremarkable. Interval placement of right internal jugular catheter with distal tip in expected position of cavoatrial junction. No pneumothorax or pleural effusion is noted. IMPRESSION: Interval placement of right internal jugular catheter with distal tip in expected position of cavoatrial junction. No pneumothorax is noted. Electronically Signed   By: Lupita Raider, M.D.   On: 08/21/2016 15:01   Dg Hip  Unilat W Or Wo Pelvis 2-3 Views Left  Result Date: 08/21/2016 CLINICAL DATA:  Left hip pain since a fall 08/18/2016. Patient found down 3 days later. EXAM: DG HIP (WITH OR WITHOUT PELVIS) 2-3V LEFT COMPARISON:  None. FINDINGS: Acute left intertrochanteric fracture is identified. Left femoral head is located. No other acute  abnormality. IMPRESSION: Acute left intertrochanteric fracture. Electronically Signed   By: Drusilla Kannerhomas  Dalessio M.D.   On: 08/21/2016 13:54   Scheduled Meds: . ampicillin-sulbactam (UNASYN) IV  3 g Intravenous Q8H  . folic acid  1 mg Intravenous Daily  . metoprolol tartrate  25 mg Oral BID  . pantoprazole  40 mg Oral Q0600  . phytonadione  10 mg Oral Daily  . potassium phosphate (monobasic)  500 mg Oral TID WC & HS  . thiamine  100 mg Intravenous Daily   Continuous Infusions: PRN Meds:.   ASSESMENT:   *  GI bleed.  Hx GERD and peptic esoph stricture but no varices or portal gastropathy on EGD 07/2015.  08/22/16 EGD:  Benign distal esophageal stenosis, benign-appearing duodenal mass. Portal hypertensive gastropathy.  *  Cirrhosis and ETOH hepatitis.  LFTs improved.   *  Coagulopathy.  S/p FFP, IV vitamin K, ongoing oral vitamin K with overall improved but persistent coagulopathy.  *  Thrombocytopenia.  S/p platelets 12/12.  *  Macrocytic anemia.  S/p PRBCs x three 12/11- 12/12 .  Hemoglobin decline compared with posttransfusion high.   On Thiamine.    *  AKI.  Improved.   *  AMS.  Ammonia level 64 >> 50  *  Left hip fx.   Do not yet see consult from orthopedics  *  Elevated troponin.  Cardiology attributes this to demand ischemia in the setting of sepsis and dehydration. Due to ongoing anemia and thrombocytopenia is not felt to be a candidate for heart especially where she to require stenting she is a terrible candidate for dual antiplatelet therapy.  Though it is reassuring that her echo shows normal ejection fraction and no wall motion abnormalities, due to her multiple comorbidities is felt high risk for hip surgery.  *  Likely LLL PNA.  On Unasyn.     PLAN   *  Follow LFTs, CBC, coags.      Jennye MoccasinSarah Orit Sanville  08/23/2016, 12:19 PM Pager: [phone]

## 2016-08-24 ENCOUNTER — Encounter (HOSPITAL_COMMUNITY): Payer: Self-pay | Admitting: Orthopedic Surgery

## 2016-08-24 DIAGNOSIS — S72002D Fracture of unspecified part of neck of left femur, subsequent encounter for closed fracture with routine healing: Secondary | ICD-10-CM

## 2016-08-24 DIAGNOSIS — S72009A Fracture of unspecified part of neck of unspecified femur, initial encounter for closed fracture: Secondary | ICD-10-CM | POA: Diagnosis present

## 2016-08-24 LAB — PREPARE FRESH FROZEN PLASMA
Blood Product Expiration Date: 201712182359
ISSUE DATE / TIME: 201712131323
Unit Type and Rh: 8400

## 2016-08-24 LAB — CBC WITH DIFFERENTIAL/PLATELET
BASOS ABS: 0 10*3/uL (ref 0.0–0.1)
Basophils Relative: 0 %
EOS ABS: 0.1 10*3/uL (ref 0.0–0.7)
Eosinophils Relative: 1 %
HCT: 28.9 % — ABNORMAL LOW (ref 36.0–46.0)
Hemoglobin: 9.6 g/dL — ABNORMAL LOW (ref 12.0–15.0)
LYMPHS ABS: 0.8 10*3/uL (ref 0.7–4.0)
Lymphocytes Relative: 15 %
MCH: 32.2 pg (ref 26.0–34.0)
MCHC: 33.2 g/dL (ref 30.0–36.0)
MCV: 97 fL (ref 78.0–100.0)
MONO ABS: 0.5 10*3/uL (ref 0.1–1.0)
Monocytes Relative: 9 %
Neutro Abs: 4.2 10*3/uL (ref 1.7–7.7)
Neutrophils Relative %: 75 %
Platelets: 54 10*3/uL — ABNORMAL LOW (ref 150–400)
RBC: 2.98 MIL/uL — AB (ref 3.87–5.11)
RDW: 24.3 % — AB (ref 11.5–15.5)
WBC: 5.6 10*3/uL (ref 4.0–10.5)

## 2016-08-24 LAB — COMPREHENSIVE METABOLIC PANEL
ALT: 45 U/L (ref 14–54)
AST: 145 U/L — ABNORMAL HIGH (ref 15–41)
Albumin: 1.9 g/dL — ABNORMAL LOW (ref 3.5–5.0)
Alkaline Phosphatase: 82 U/L (ref 38–126)
Anion gap: 9 (ref 5–15)
BILIRUBIN TOTAL: 6.5 mg/dL — AB (ref 0.3–1.2)
BUN: 26 mg/dL — ABNORMAL HIGH (ref 6–20)
CHLORIDE: 102 mmol/L (ref 101–111)
CO2: 29 mmol/L (ref 22–32)
CREATININE: 1.17 mg/dL — AB (ref 0.44–1.00)
Calcium: 7 mg/dL — ABNORMAL LOW (ref 8.9–10.3)
GFR, EST AFRICAN AMERICAN: 58 mL/min — AB (ref >60)
GFR, EST NON AFRICAN AMERICAN: 50 mL/min — AB (ref >60)
Glucose, Bld: 146 mg/dL — ABNORMAL HIGH (ref 65–99)
POTASSIUM: 2.8 mmol/L — AB (ref 3.5–5.1)
Sodium: 140 mmol/L (ref 135–145)
TOTAL PROTEIN: 6.4 g/dL — AB (ref 6.5–8.1)

## 2016-08-24 LAB — MAGNESIUM: MAGNESIUM: 1.6 mg/dL — AB (ref 1.7–2.4)

## 2016-08-24 LAB — PHOSPHORUS: PHOSPHORUS: 1.6 mg/dL — AB (ref 2.5–4.6)

## 2016-08-24 LAB — CULTURE, GROUP A STREP (THRC)

## 2016-08-24 LAB — PROTIME-INR
INR: 1.93
PROTHROMBIN TIME: 22.4 s — AB (ref 11.4–15.2)

## 2016-08-24 MED ORDER — MENTHOL 3 MG MT LOZG: 1.0000 | LOZENGE | OROMUCOSAL | Status: DC | PRN

## 2016-08-24 MED ORDER — OXYCODONE HCL 5 MG PO TABS: 5.0000 mg | ORAL_TABLET | Freq: Once | ORAL | Status: DC | PRN

## 2016-08-24 MED ORDER — PHENOL 1.4 % MT LIQD
1.0000 | OROMUCOSAL | Status: DC | PRN
  Filled 2016-08-24: qty 177

## 2016-08-24 MED ORDER — K PHOS MONO-SOD PHOS DI & MONO 155-852-130 MG PO TABS
500.0000 mg | ORAL_TABLET | Freq: Three times a day (TID) | ORAL | Status: DC
  Administered 2016-08-24 – 2016-08-30 (×10): 500 mg via ORAL
  Filled 2016-08-24 (×32): qty 2

## 2016-08-24 MED ORDER — ACETAMINOPHEN 650 MG RE SUPP: 650.0000 mg | Freq: Four times a day (QID) | RECTAL | Status: DC | PRN

## 2016-08-24 MED ORDER — OXYCODONE HCL 5 MG PO TABS
5.0000 mg | ORAL_TABLET | ORAL | Status: DC | PRN
  Administered 2016-08-24 – 2016-08-31 (×10): 5 mg via ORAL
  Filled 2016-08-24 (×10): qty 1

## 2016-08-24 MED ORDER — FENTANYL CITRATE (PF) 100 MCG/2ML IJ SOLN: 25.0000 ug | INTRAMUSCULAR | Status: DC | PRN

## 2016-08-24 MED ORDER — ONDANSETRON HCL 4 MG PO TABS
4.0000 mg | ORAL_TABLET | Freq: Four times a day (QID) | ORAL | Status: DC | PRN
  Administered 2016-08-24: 4 mg via ORAL
  Filled 2016-08-24: qty 1

## 2016-08-24 MED ORDER — METOCLOPRAMIDE HCL 5 MG PO TABS: 5.0000 mg | ORAL_TABLET | Freq: Three times a day (TID) | ORAL | Status: DC | PRN

## 2016-08-24 MED ORDER — MAGNESIUM SULFATE 2 GM/50ML IV SOLN
2.0000 g | Freq: Once | INTRAVENOUS | Status: AC
  Administered 2016-08-24: 2 g via INTRAVENOUS
  Filled 2016-08-24: qty 50

## 2016-08-24 MED ORDER — ACETAMINOPHEN 325 MG PO TABS
650.0000 mg | ORAL_TABLET | Freq: Four times a day (QID) | ORAL | Status: DC | PRN
  Administered 2016-08-25: 650 mg via ORAL
  Filled 2016-08-24: qty 2

## 2016-08-24 MED ORDER — POTASSIUM CHLORIDE CRYS ER 20 MEQ PO TBCR
40.0000 meq | EXTENDED_RELEASE_TABLET | Freq: Two times a day (BID) | ORAL | Status: AC
  Administered 2016-08-24 (×2): 40 meq via ORAL
  Filled 2016-08-24 (×2): qty 2

## 2016-08-24 MED ORDER — METHOCARBAMOL 1000 MG/10ML IJ SOLN
500.0000 mg | Freq: Four times a day (QID) | INTRAVENOUS | Status: DC | PRN
  Filled 2016-08-24: qty 5

## 2016-08-24 MED ORDER — METHOCARBAMOL 500 MG PO TABS
500.0000 mg | ORAL_TABLET | Freq: Four times a day (QID) | ORAL | Status: DC | PRN
  Administered 2016-08-25: 500 mg via ORAL
  Filled 2016-08-24: qty 1

## 2016-08-24 MED ORDER — ALBUTEROL SULFATE (2.5 MG/3ML) 0.083% IN NEBU: 2.5000 mg | INHALATION_SOLUTION | Freq: Four times a day (QID) | RESPIRATORY_TRACT | Status: DC | PRN

## 2016-08-24 MED ORDER — ONDANSETRON HCL 4 MG/2ML IJ SOLN: 4.0000 mg | Freq: Once | INTRAMUSCULAR | Status: DC | PRN

## 2016-08-24 MED ORDER — OXYCODONE HCL 5 MG/5ML PO SOLN: 5.0000 mg | Freq: Once | ORAL | Status: DC | PRN

## 2016-08-24 MED ORDER — ONDANSETRON HCL 4 MG/2ML IJ SOLN: 4.0000 mg | Freq: Four times a day (QID) | INTRAMUSCULAR | Status: DC | PRN

## 2016-08-24 MED ORDER — POTASSIUM CHLORIDE IN NACL 20-0.9 MEQ/L-% IV SOLN: INTRAVENOUS | Status: DC

## 2016-08-24 MED ORDER — METOCLOPRAMIDE HCL 5 MG/ML IJ SOLN
5.0000 mg | Freq: Three times a day (TID) | INTRAMUSCULAR | Status: DC | PRN
Start: 2016-08-24 — End: 2016-08-26
  Administered 2016-08-24: 10 mg via INTRAVENOUS
  Filled 2016-08-24: qty 2

## 2016-08-24 NOTE — Anesthesia Postprocedure Evaluation (Signed)
Anesthesia Post Note  Patient: Debbie ElseDeborah C Valentine  Procedure(s) Performed: Procedure(s) (LRB): INTRAMEDULLARY HIP SCREW left (Left)  Patient location during evaluation: PACU Anesthesia Type: General Level of consciousness: awake, awake and alert and oriented Pain management: pain level controlled Vital Signs Assessment: post-procedure vital signs reviewed and stable Respiratory status: spontaneous breathing, nonlabored ventilation and respiratory function stable Cardiovascular status: blood pressure returned to baseline Anesthetic complications: no    Last Vitals:  Vitals:   08/23/16 2345 08/24/16 0000  BP: 122/66 116/66  Pulse: 78 73  Resp: 10 11  Temp:  36.3 C    Last Pain:  Vitals:   08/23/16 2330  TempSrc:   PainSc: Asleep                 Kathy Wahid COKER

## 2016-08-24 NOTE — NC FL2 (Signed)
[place] MEDICAID FL2 LEVEL OF CARE SCREENING TOOL     IDENTIFICATION  Patient Name: [name] Birthdate: Sep 15, 1955 Sex: female Admission Date (Current Location): 08/21/2016  [facility] [place]   Facility and Address:  The . [facility], [address], [place], [place] [address]      Provider Number: [id number]  Attending Physician Name and Address:  Zannie CovePreetha Joseph, MD  Relative Name and Phone Number:  Misty StanleyLisa, daughter, [phone]    Current Level of Care: Hospital Recommended Level of Care: Skilled Nursing Facility Prior Approval Number:    Date Approved/Denied:   PASRR Number: [id number]  Discharge Plan: SNF    Current Diagnoses: Patient Active Problem List   Diagnosis Date Noted  . Hip fracture (HCC) 08/24/2016  . Fall   . Acute blood loss anemia   . GI bleed 08/21/2016  . Acute upper GI bleed   . Chronic liver failure with hepatic coma (HCC)   . Sepsis secondary to UTI (HCC) 04/28/2016  . Tobacco use 04/27/2016  . Mucosal abnormality of stomach   . Mucosal abnormality of esophagus   . Melena 07/06/2015  . Renal lesion 07/06/2015  . Sepsis (HCC) 06/26/2015  . Anemia of chronic disease 06/26/2015  . UTI (lower urinary tract infection) 06/25/2015  . Aspiration pneumonia (HCC) 05/29/2015  . E-coli UTI 05/28/2015  . Coagulopathy (HCC) 05/28/2015  . Sepsis due to Gram negative bacteria (HCC) 05/27/2015  . Compression fracture of L1 lumbar vertebra (HCC) 05/26/2015  . Macrocytic anemia 05/25/2015  . Alcoholic cirrhosis of liver without ascites (HCC) 05/25/2015  . Acute encephalopathy 05/25/2015  . Thrombocytopenia (HCC) 05/25/2015  . Elevated troponin 05/25/2015  . ETOH abuse 05/25/2015  . Anemia 03/22/2015  . Abnormal liver function   . Shoulder impingement syndrome 07/21/2014  . Cholecystitis, acute 07/23/2013  . Trichomonas infection 07/23/2013  . Acute gastroenteritis 07/22/2013  . Alcohol abuse  07/22/2013  . Hyponatremia 07/22/2013  . Hypokalemia 07/22/2013  . Dehydration 07/22/2013  . Macrocytosis 07/22/2013  . Cholelithiasis 07/22/2013  . Renal cyst 07/22/2013    Orientation RESPIRATION BLADDER Height & Weight     Self, Time, Situation, Place  O2 (Nasal cannula 2L) Continent, Indwelling catheter (Urinary catheter) Weight: 64 kg (141 lb 1.5 oz) Height:     BEHAVIORAL SYMPTOMS/MOOD NEUROLOGICAL BOWEL NUTRITION STATUS      Incontinent Diet (Please see DC Summary)  AMBULATORY STATUS COMMUNICATION OF NEEDS Skin   Extensive Assist Verbally Surgical wounds (Closed incision on hip)                       Personal Care Assistance Level of Assistance  Bathing, Feeding, Dressing Bathing Assistance: Maximum assistance Feeding assistance: Independent Dressing Assistance: Limited assistance     Functional Limitations Info             SPECIAL CARE FACTORS FREQUENCY  PT (By licensed PT)     PT Frequency: not yet assessed              Contractures      Additional Factors Info  Code Status, Allergies Code Status Info: Full Allergies Info: Loratadine           Current Medications (08/24/2016):  This is the current hospital active medication list Current Facility-Administered Medications  Medication Dose Route Frequency Provider Last Rate Last Dose  . acetaminophen (TYLENOL) tablet 650 mg  650 mg Oral Q6H PRN Cammy CopaScott Gregory Dean, MD  Or  . acetaminophen (TYLENOL) suppository 650 mg  650 mg Rectal Q6H PRN Cammy CopaScott Gregory Dean, MD      . albuterol (PROVENTIL) (2.5 MG/3ML) 0.083% nebulizer solution 2.5 mg  2.5 mg Inhalation Q6H PRN Cammy CopaScott Gregory Dean, MD      . Ampicillin-Sulbactam (UNASYN) 3 g in sodium chloride 0.9 % 100 mL IVPB  3 g Intravenous Q8H Kalman ShanMurali Ramaswamy, MD   3 g at 08/24/16 0935  . folic acid injection 1 mg  1 mg Intravenous Daily Rahul P Desai, PA-C   1 mg at 08/24/16 0920  . magnesium sulfate IVPB 2 g 50 mL  2 g Intravenous Once Zannie CovePreetha  Joseph, MD   2 g at 08/24/16 1611  . menthol-cetylpyridinium (CEPACOL) lozenge 3 mg  1 lozenge Oral PRN Cammy CopaScott Gregory Dean, MD       Or  . phenol (CHLORASEPTIC) mouth spray 1 spray  1 spray Mouth/Throat PRN Cammy CopaScott Gregory Dean, MD      . methocarbamol (ROBAXIN) tablet 500 mg  500 mg Oral Q6H PRN Cammy CopaScott Gregory Dean, MD       Or  . methocarbamol (ROBAXIN) 500 mg in dextrose 5 % 50 mL IVPB  500 mg Intravenous Q6H PRN Cammy CopaScott Gregory Dean, MD      . metoCLOPramide (REGLAN) tablet 5-10 mg  5-10 mg Oral Q8H PRN Cammy CopaScott Gregory Dean, MD       Or  . metoCLOPramide (REGLAN) injection 5-10 mg  5-10 mg Intravenous Q8H PRN Cammy CopaScott Gregory Dean, MD   10 mg at 08/24/16 1253  . metoprolol tartrate (LOPRESSOR) tablet 25 mg  25 mg Oral BID Zannie CovePreetha Joseph, MD   25 mg at 08/23/16 1007  . morphine 2 MG/ML injection 1 mg  1 mg Intravenous Q4H PRN Zannie CovePreetha Joseph, MD   1 mg at 08/24/16 [id number]  . ondansetron (ZOFRAN) injection 4 mg  4 mg Intravenous Q6H PRN Zannie CovePreetha Joseph, MD   4 mg at 08/23/16 1454  . ondansetron (ZOFRAN) tablet 4 mg  4 mg Oral Q6H PRN Cammy CopaScott Gregory Dean, MD   4 mg at 08/24/16 0920   Or  . ondansetron [facility](ZOFRAN) injection 4 mg  4 mg Intravenous Q6H PRN Cammy CopaScott Gregory Dean, MD      . oxyCODONE (Oxy IR/ROXICODONE) immediate release tablet 5 mg  5 mg Oral Q4H PRN Cammy CopaScott Gregory Dean, MD      . pantoprazole (PROTONIX) EC tablet 40 mg  40 mg Oral Q0600 Dianah FieldSarah J Gribbin, PA-C   40 mg at 08/24/16 0548  . phosphorus (K PHOS NEUTRAL) tablet 500 mg  500 mg Oral TID WC & HS Zannie CovePreetha Joseph, MD      . phytonadione (VITAMIN K) tablet 10 mg  10 mg Oral Daily Dianah FieldSarah J Gribbin, PA-C   10 mg at 08/24/16 0920  . potassium chloride SA (K-DUR,KLOR-CON) CR tablet 40 mEq  40 mEq Oral BID Zannie CovePreetha Joseph, MD   40 mEq at 08/24/16 0920  . thiamine (B-1) injection 100 mg  100 mg Intravenous Daily Rahul P Desai, PA-C   100 mg at 08/24/16 [id number]     Discharge Medications: Please see discharge summary for a list of discharge  medications.  Relevant Imaging Results:  Relevant Lab Results:   Additional Information SSN: [id number]  Talyia Allende S Goddard[place]yan, [place]

## 2016-08-24 NOTE — Anesthesia Preprocedure Evaluation (Addendum)
Anesthesia Evaluation  Patient identified by MRN, date of birth, ID band Patient awake    Reviewed: Allergy & Precautions, NPO status , Patient's Chart, lab work & pertinent test results  Airway Mallampati: II  TM Distance: >3 FB Neck ROM: Full    Dental  (+) Edentulous Upper, Dental Advisory Given   Pulmonary Current Smoker,    breath sounds clear to auscultation       Cardiovascular hypertension,  Rhythm:Regular Rate:Normal     Neuro/Psych    GI/Hepatic   Endo/Other    Renal/GU      Musculoskeletal   Abdominal   Peds  Hematology   Anesthesia Other Findings   Reproductive/Obstetrics                            Anesthesia Physical Anesthesia Plan  ASA: III and emergent  Anesthesia Plan: General   Post-op Pain Management:    Induction: Intravenous, Rapid sequence and Cricoid pressure planned  Airway Management Planned: Oral ETT  Additional Equipment:   Intra-op Plan:   Post-operative Plan:   Informed Consent: I have reviewed the patients History and Physical, chart, labs and discussed the procedure including the risks, benefits and alternatives for the proposed anesthesia with the patient or authorized representative who has indicated his/her understanding and acceptance.   Dental advisory given  Plan Discussed with: CRNA and Anesthesiologist  Anesthesia Plan Comments:        Anesthesia Quick Evaluation
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
## 2016-08-24 NOTE — Progress Notes (Signed)
PROGRESS NOTE    [name]  ZOX:[id number] DOB: 1956-02-05 DOA: 08/21/2016 PCP: Vertis Kelch, NP  Brief Narrative PCCM Transfer 12/13 60/F with Alcoholism and tobacco abuse, She presented to [facility] ED 12/11 after her son found her in her home, lying in her bed covered in feces.  She apparently had a mechanical fall 3 days prior. Imaging in ED revealed intertrochanteric hip fx.  She also had positive FOBT and reported that she had been experiencing hematemesis as well as melena, long standing hx of significant ETOH intake (she reports "three 1/2 gallons of liquor per day). Admitted to ICU per PCCM, had EGD: unremarkable, portal gastropathy, given 2U PRBC, Troponin 2, Ortho consulted and transferred to [facility] to step down today 12/13.  Assessment & Plan:   Active Problems:   Anemia/Upper GI bleed -admitted with Hb of 5.8 in background of chronic alcoholism -s/p 3units PRBC and 2units FFP -EGD with portal gastropathy, no varices and no active bleeding -continue Po PPI now -Hb stable, improved    Left hip intertrochanteric fracture  -Ortho consulted, s/p IM Nail 12/13 -tentatively Ortho planning surgery later today, but troponin is 2 -ECHO with normal EF and wall motion -appreciate Cards input, started low dose BB -suspect she is intermediate risk for intermediate risk surgery  Elevated troponin -likely due to demand from severe anemia, no known CAD -peaked at 2 -ECHO with normal Wall motion and EF,  -appreciate Cards input, continue Coreg  Alcoholism -long standing -continue thiamine, monitor for CIWA -per daughter 35-40years -no overt withdrawal yet  Aspiration pneumonia -continue unasyn -FU cultures, aspiration precautions -change to PO Augmentin tomorrow  Chronic liver failure with coagulopathy/thrombocytopenia -due to alcohol -FFP today, monitor  AKi  -baseline creatinine normal -improving  DVT prophylaxis:SCDS, INR up Code Status:Full Code Family  Communication:daughter at bedside Disposition Plan: Keep in SDU today and transfer to floor tomorrow  Consultants:   GI  Ortho  PCCM  Cards   Procedures:ECHO   Antimicrobials: Unasyn D3   Subjective: Just wants tto eat, pain in L hip  Objective: Vitals:   08/24/16 0439 08/24/16 0823 08/24/16 0900 08/24/16 1229  BP:  97/65 (!) 99/57 (!) 104/57  Pulse: 72 75 68 78  Resp: 14 15 13 14   Temp:  97.8 F (36.6 C)  97.5 F (36.4 C)  TempSrc:  Axillary  Oral  SpO2: 97% 99% 98% 96%  Weight:        Intake/Output Summary (Last 24 hours) at 08/24/16 1336 Last data filed at 08/24/16 0600  Gross per 24 hour  Intake             2081 ml  Output             3210 ml  Net            -1129 ml   Filed Weights   08/22/16 0800 08/23/16 0400 08/24/16 0344  Weight: 57.6 kg (126 lb 15.8 oz) 63.2 kg (139 lb 5.3 oz) 64 kg (141 lb 1.5 oz)    Examination:  General exam: Appears uncomfortable no distress, AAOx2 Respiratory system: Clear to auscultation. Respiratory effort normal. Cardiovascular system: S1 & S2 heard, RRR. No JVD, murmurs, rubs, gallops or clicks. No pedal edema. Gastrointestinal system: Abdomen is nondistended, soft and nontender. Normal bowel sounds heard. Central nervous system: Alert and oriented. No focal neurological deficits. Extremities:L hip with dressing Skin: No rashes, lesions or ulcers Psychiatry: Judgement and insight appear normal. Mood & affect appropriate.  Data Reviewed: I have personally reviewed following labs and imaging studies  CBC:  Recent Labs Lab 08/21/16 1249 08/21/16 2130 08/22/16 0100 08/22/16 1500 08/23/16 0330 08/24/16 0200  WBC 16.2*  --  14.7* 10.3 7.8 5.6  NEUTROABS 13.6*  --   --   --  5.6 4.2  HGB 6.3* 8.6* 8.2* 7.8* 7.5* 9.6*  HCT 21.1* 27.1* 25.5* 24.1* 23.3* 28.9*  MCV 133.5*  --  105.8* 105.2* 105.0* 97.0  PLT 86*  --  46* 112* 89* 54*   Basic Metabolic Panel:  Recent Labs Lab 08/22/16 0100 08/22/16 0640  08/22/16 1500 08/23/16 0200 08/23/16 0330 08/24/16 0200  NA 135 137 139 139  --  140  K 3.1* 3.8 3.5 3.1*  --  2.8*  CL 102 104 107 107  --  102  CO2 25 23 23 25   --  29  GLUCOSE 104* 102* 152* 151*  --  146*  BUN 14 14 18  24*  --  26*  CREATININE 1.84* 1.70* 1.61* 1.48*  --  1.17*  CALCIUM 6.9* 6.8* 7.0* 7.0*  --  7.0*  MG 1.4* 2.1  --   --  2.1 1.6*  PHOS 2.4*  --   --   --  <1.0* 1.6*   GFR: Estimated Creatinine Clearance: 49.1 mL/min (by C-G formula based on SCr of 1.17 mg/dL (H)). Liver Function Tests:  Recent Labs Lab 08/21/16 1249 08/22/16 0405 08/23/16 0330 08/24/16 0200  AST 221* 205* 182* 145*  ALT 54 42 45 45  ALKPHOS 92 61 76 82  BILITOT 8.1* 6.9* 5.3* 6.5*  PROT 6.7 5.9* 6.3* 6.4*  ALBUMIN 2.1* 1.8* 1.8* 1.9*    Recent Labs Lab 08/21/16 1249  LIPASE 56*    Recent Labs Lab 08/21/16 1249 08/22/16 0405  AMMONIA 64* 50*   Coagulation Profile:  Recent Labs Lab 08/21/16 1249 08/23/16 0330 08/23/16 1814 08/24/16 0200  INR 3.24 2.18 1.65 1.93   Cardiac Enzymes:  Recent Labs Lab 08/21/16 1256 08/21/16 1302 08/21/16 2130 08/22/16 0405 08/23/16 0330  CKTOTAL  --  425*  --   --   --   TROPONINI 1.60*  --  1.66* 2.10* 2.19*   BNP (last 3 results) No results for input(s): PROBNP in the last 8760 hours. HbA1C: No results for input(s): HGBA1C in the last 72 hours. CBG:  Recent Labs Lab 08/21/16 1203 08/21/16 1750 08/21/16 2031  GLUCAP 129* 132* 141*   Lipid Profile: No results for input(s): CHOL, HDL, LDLCALC, TRIG, CHOLHDL, LDLDIRECT in the last 72 hours. Thyroid Function Tests: No results for input(s): TSH, T4TOTAL, FREET4, T3FREE, THYROIDAB in the last 72 hours. Anemia Panel: No results for input(s): VITAMINB12, FOLATE, FERRITIN, TIBC, IRON, RETICCTPCT in the last 72 hours. Urine analysis:    Component Value Date/Time   COLORURINE AMBER (A) 08/21/2016 1239   APPEARANCEUR CLOUDY (A) 08/21/2016 1239   LABSPEC 1.025 08/21/2016  1239   PHURINE 5.5 08/21/2016 1239   GLUCOSEU 100 (A) 08/21/2016 1239   HGBUR NEGATIVE 08/21/2016 1239   BILIRUBINUR MODERATE (A) 08/21/2016 1239   KETONESUR NEGATIVE 08/21/2016 1239   PROTEINUR 30 (A) 08/21/2016 1239   UROBILINOGEN 2.0 (H) 06/25/2015 1746   NITRITE POSITIVE (A) 08/21/2016 1239   LEUKOCYTESUR TRACE (A) 08/21/2016 1239   Sepsis Labs: @LABRCNTIP (procalcitonin:4,lacticidven:4)  ) Recent Results (from the past 240 hour(s))  Urine culture     Status: None   Collection Time: 08/21/16 12:39 PM  Result Value Ref Range Status   Specimen Description  URINE, RANDOM  Final   Special Requests NONE  Final   Culture NO GROWTH [facility] [place]   Final   Report Status 08/23/2016 FINAL  Final  Blood culture (routine x 2)     Status: None (Preliminary result)   Collection Time: 08/21/16 12:49 PM  Result Value Ref Range Status   Specimen Description BLOOD RIGHT FOREARM  Final   Special Requests BOTTLES DRAWN AEROBIC AND ANAEROBIC 6CC EACH  Final   Culture NO GROWTH 3 DAYS  Final   Report Status PENDING  Incomplete  Blood culture (routine x 2)     Status: None (Preliminary result)   Collection Time: 08/21/16 12:55 PM  Result Value Ref Range Status   Specimen Description LEFT ANTECUBITAL  Final   Special Requests BOTTLES DRAWN AEROBIC AND ANAEROBIC 6CC EACH  Final   Culture NO GROWTH 3 DAYS  Final   Report Status PENDING  Incomplete  Culture, group A strep     Status: None   Collection Time: 08/21/16  6:55 PM  Result Value Ref Range Status   Specimen Description THROAT  Final   Special Requests NONE  Final   Culture NO GROUP A STREP (S.PYOGENES) ISOLATED  Final   Report Status 08/24/2016 FINAL  Final         Radiology Studies: Pelvis Portable  Result Date: 08/23/2016 CLINICAL DATA:  60 year old female status post internal fixation hip of the left femoral fracture. EXAM: PORTABLE PELVIS 1-2 VIEWS COMPARISON:  Abdominal CT dated 04/27/2016 FINDINGS:  Five intraoperative fluoroscopy spot images of the left hip and 1 radiograph of the pelvis provided. There is a mildly displaced intratrochanteric fracture of the left femur. There is interval placement of and intramedullary rod and transcervical fixation screws. The orthopedic hardware appears intact. There is near complete anatomic alignment of the fracture fragments of the left femoral neck. The bones are osteopenic. There is mild osteoarthritic changes of the hips. There is postsurgical changes of the soft tissues of the left lateral pelvis with small pockets of soft tissue air. IMPRESSION: Internal fixation of left femoral intertrochanteric fracture. The fracture fragments appear in anatomic alignment. Electronically Signed   By: Elgie CollardArash  Radparvar M.D.   On: 08/23/2016 23:53   Dg C-arm 1-60 Min  Result Date: 08/23/2016 CLINICAL DATA:  Left femur fracture EXAM: DG C-ARM 61-120 MIN; LEFT FEMUR 2 VIEWS COMPARISON:  08/21/2016 FINDINGS: Five low resolution intraoperative spot films of the left femur are submitted. Total fluoroscopy time was 1 minutes 47 seconds. Images were obtained during intramedullary rod fixation of a left trochanteric fracture. There is anatomic alignment. IMPRESSION: Intraoperative fluoroscopic assistance provided during intramedullary rod fixation of proximal left femur fracture Electronically Signed   By: Jasmine PangKim  Fujinaga M.D.   On: 08/23/2016 23:47   Dg Femur Min 2 Views Left  Result Date: 08/23/2016 CLINICAL DATA:  Left femur fracture EXAM: DG C-ARM 61-120 MIN; LEFT FEMUR 2 VIEWS COMPARISON:  08/21/2016 FINDINGS: Five low resolution intraoperative spot films of the left femur are submitted. Total fluoroscopy time was 1 minutes 47 seconds. Images were obtained during intramedullary rod fixation of a left trochanteric fracture. There is anatomic alignment. IMPRESSION: Intraoperative fluoroscopic assistance provided during intramedullary rod fixation of proximal left femur fracture  Electronically Signed   By: Jasmine PangKim  Fujinaga M.D.   On: 08/23/2016 23:47        Scheduled Meds: . ampicillin-sulbactam (UNASYN) IV  3 g Intravenous Q8H  . folic acid  1 mg Intravenous Daily  .  metoprolol tartrate  25 mg Oral BID  . pantoprazole  40 mg Oral Q0600  . phosphorus  500 mg Oral TID WC & HS  . phytonadione  10 mg Oral Daily  . potassium chloride  40 mEq Oral BID  . thiamine  100 mg Intravenous Daily   Continuous Infusions:   LOS: 3 days    Time spent:    Zannie Cove, MD [facility] Pager [phone]  If 7PM-7AM, please contact night-coverage [email or web address] Password [email or web address] 08/24/2016, 1:36 PM

## 2016-08-25 LAB — CBC WITH DIFFERENTIAL/PLATELET
Basophils Absolute: 0 10*3/uL (ref 0.0–0.1)
Basophils Relative: 0 %
EOS ABS: 0.2 10*3/uL (ref 0.0–0.7)
Eosinophils Relative: 2 %
HEMATOCRIT: 25 % — AB (ref 36.0–46.0)
HEMOGLOBIN: 8.2 g/dL — AB (ref 12.0–15.0)
LYMPHS ABS: 1.3 10*3/uL (ref 0.7–4.0)
Lymphocytes Relative: 17 %
MCH: 31.7 pg (ref 26.0–34.0)
MCHC: 32.8 g/dL (ref 30.0–36.0)
MCV: 96.5 fL (ref 78.0–100.0)
MONO ABS: 1.2 10*3/uL — AB (ref 0.1–1.0)
Monocytes Relative: 15 %
Neutro Abs: 5.2 10*3/uL (ref 1.7–7.7)
Neutrophils Relative %: 66 %
PLATELETS: 51 10*3/uL — AB (ref 150–400)
RBC: 2.59 MIL/uL — ABNORMAL LOW (ref 3.87–5.11)
RDW: 25.3 % — ABNORMAL HIGH (ref 11.5–15.5)
WBC: 7.9 10*3/uL (ref 4.0–10.5)

## 2016-08-25 LAB — BASIC METABOLIC PANEL
ANION GAP: 6 (ref 5–15)
BUN: 23 mg/dL — ABNORMAL HIGH (ref 6–20)
CALCIUM: 7.2 mg/dL — AB (ref 8.9–10.3)
CO2: 33 mmol/L — ABNORMAL HIGH (ref 22–32)
Chloride: 103 mmol/L (ref 101–111)
Creatinine, Ser: 0.95 mg/dL (ref 0.44–1.00)
GFR calc Af Amer: >60 mL/min (ref >60)
GLUCOSE: 118 mg/dL — AB (ref 65–99)
Potassium: 3.3 mmol/L — ABNORMAL LOW (ref 3.5–5.1)
SODIUM: 142 mmol/L (ref 135–145)

## 2016-08-25 LAB — PROTIME-INR
INR: 1.6
PROTHROMBIN TIME: 19.2 s — AB (ref 11.4–15.2)

## 2016-08-25 LAB — PHOSPHORUS: Phosphorus: 1.3 mg/dL — ABNORMAL LOW (ref 2.5–4.6)

## 2016-08-25 LAB — MAGNESIUM: Magnesium: 2.1 mg/dL (ref 1.7–2.4)

## 2016-08-25 MED ORDER — POTASSIUM CHLORIDE CRYS ER 20 MEQ PO TBCR
40.0000 meq | EXTENDED_RELEASE_TABLET | Freq: Two times a day (BID) | ORAL | Status: AC
  Administered 2016-08-25: 40 meq via ORAL
  Filled 2016-08-25 (×2): qty 2

## 2016-08-25 NOTE — Care Management Note (Signed)
Case Management Note  Patient Details  Name: [name] MRN: [id number] Date of Birth: 1956-04-10  Subjective/Objective:   Pt lives with husband, daughter [name](Lisa Poindexter #[phone]), lives in area and is supportive.  Pt previously applied for disability but does not have work history to qualify.  Daughter is hopeful that pt will qualify for medicaid so she can rehab @ SNF.                                Expected Discharge Plan:  Skilled Nursing Facility  In-House Referral:  Clinical Social Work  Discharge planning Services  CM Consult  Status of Service:  In process, will continue to follow  Magdalene RiverMayo, Inita Uram T, RN 08/25/2016, 7:48 AM

## 2016-08-25 NOTE — Progress Notes (Signed)
PROGRESS NOTE    Debbie ElseDeborah C Valentine  ZOX:[id number] DOB: [birth year] DOA: 08/21/2016 PCP: Vertis KelchALLEN, DEBRA, NP  Brief Narrative PCCM Transfer 12/13 60/F with Alcoholism and tobacco abuse, She presented to [facility] ED 12/11 after her son found her in her home, lying in her bed covered in feces.  She apparently had a mechanical fall 3 days prior. Imaging in ED revealed intertrochanteric hip fx.  She also had positive FOBT and reported that she had been experiencing hematemesis as well as melena, long standing hx of significant ETOH intake (she reports "three 1/2 gallons of liquor per day). Admitted to ICU per PCCM, had EGD: unremarkable, portal gastropathy, given 2U PRBC, Troponin 2, Ortho [facility].  Assessment & Plan:   Active Problems:   Anemia/Upper GI bleed -admitted with Hb of 5.8 in background of chronic alcoholism -s/p 3units PRBC and 2units FFP -EGD with portal gastropathy, no varices and no active bleeding -continue Po PPI now -Hb stable, improved    Left hip intertrochanteric fracture  -Ortho consulted, s/p IM Nail 12/13 -ECHO with normal EF and wall motion -appreciate Cards input, started low dose BB  Elevated troponin -likely due to demand from severe anemia, no known CAD -peaked at 2 -ECHO with normal Wall motion and EF,  -appreciate Cards input, continue Coreg  Alcoholism -long standing -continue thiamine, monitor for CIWA -per daughter 35-40years -no overt withdrawal yet  Aspiration pneumonia -change unasyn to augmentin -FU cultures, aspiration precautions -improving  Chronic liver failure with coagulopathy/thrombocytopenia -due to alcohol -s/p FFP, monitor  AKi  -baseline creatinine normal -improving  DVT prophylaxis:SCDS, INR up Code Status:Full Code Family Communication:daughter at bedside Disposition Plan:  transfer to floor today  Consultants:    GI  Ortho  PCCM  Cards   Procedures:ECHO   Antimicrobials: Unasyn D4-> augmentin 12/15   Subjective: pain in L hip, feels weak  Objective: Vitals:   08/25/16 0001 08/25/16 0300 08/25/16 0500 08/25/16 0800  BP: (!) 103/57 111/67  113/74  Pulse: 77 87  76  Resp: 12 11  14   Temp: 98.6 F (37 C) 98.5 F (36.9 C)  98.1 F (36.7 C)  TempSrc: Oral Oral  Oral  SpO2: 98% 98%  97%  Weight:   63.9 kg (140 lb 14.4 oz)     Intake/Output Summary (Last 24 hours) at 08/25/16 1238 Last data filed at 08/25/16 0903  Gross per 24 hour  Intake              610 ml  Output              500 ml  Net              110 ml   Filed Weights   08/23/16 0400 08/24/16 0344 08/25/16 0500  Weight: 63.2 kg (139 lb 5.3 oz) 64 kg (141 lb 1.5 oz) 63.9 kg (140 lb 14.4 oz)    Examination:  General exam: Appears uncomfortable no distress, AAOx2 Respiratory system: Clear to auscultation. Respiratory effort normal. Cardiovascular system: S1 & S2 heard, RRR. No JVD, murmurs, rubs, gallops or clicks. No pedal edema. Gastrointestinal system: Abdomen is nondistended, soft and nontender. Normal bowel sounds heard. Central nervous system: Alert and oriented. No focal neurological deficits. Extremities:L hip with dressing Skin: No rashes, lesions or ulcers Psychiatry: Judgement and insight appear normal. Mood & affect appropriate.     Data Reviewed: I have personally reviewed following labs and imaging studies  CBC:  Recent Labs Lab 08/21/16 1249  08/22/16 0100 08/22/16 1500 08/23/16 0330 08/24/16 0200 08/25/16 0427  WBC 16.2*  --  14.7* 10.3 7.8 5.6 7.9  NEUTROABS 13.6*  --   --   --  5.6 4.2 5.2  HGB 6.3*  < > 8.2* 7.8* 7.5* 9.6* 8.2*  HCT 21.1*  < > 25.5* 24.1* 23.3* 28.9* 25.0*  MCV 133.5*  --  105.8* 105.2* 105.0* 97.0 96.5  PLT 86*  --  46* 112* 89* 54* 51*  < > = values in this interval not displayed. Basic Metabolic Panel:  Recent Labs Lab 08/22/16 0100 08/22/16 0640  08/22/16 1500 08/23/16 0200 08/23/16 0330 08/24/16 0200 08/25/16 0427  NA 135 137 139 139  --  140 142  K 3.1* 3.8 3.5 3.1*  --  2.8* 3.3*  CL 102 104 107 107  --  102 103  CO2 25 23 23 25   --  29 33*  GLUCOSE 104* 102* 152* 151*  --  146* 118*  BUN 14 14 18  24*  --  26* 23*  CREATININE 1.84* 1.70* 1.61* 1.48*  --  1.17* 0.95  CALCIUM 6.9* 6.8* 7.0* 7.0*  --  7.0* 7.2*  MG 1.4* 2.1  --   --  2.1 1.6* 2.1  PHOS 2.4*  --   --   --  <1.0* 1.6* 1.3*   GFR: Estimated Creatinine Clearance: 60.4 mL/min (by C-G formula based on SCr of 0.95 mg/dL). Liver Function Tests:  Recent Labs Lab 08/21/16 1249 08/22/16 0405 08/23/16 0330 08/24/16 0200  AST 221* 205* 182* 145*  ALT 54 42 45 45  ALKPHOS 92 61 76 82  BILITOT 8.1* 6.9* 5.3* 6.5*  PROT 6.7 5.9* 6.3* 6.4*  ALBUMIN 2.1* 1.8* 1.8* 1.9*    Recent Labs Lab 08/21/16 1249  LIPASE 56*    Recent Labs Lab 08/21/16 1249 08/22/16 0405  AMMONIA 64* 50*   Coagulation Profile:  Recent Labs Lab 08/21/16 1249 08/23/16 0330 08/23/16 1814 08/24/16 0200 08/25/16 0427  INR 3.24 2.18 1.65 1.93 1.60   Cardiac Enzymes:  Recent Labs Lab 08/21/16 1256 08/21/16 1302 08/21/16 2130 08/22/16 0405 08/23/16 0330  CKTOTAL  --  425*  --   --   --   TROPONINI 1.60*  --  1.66* 2.10* 2.19*   BNP (last 3 results) No results for input(s): PROBNP in the last 8760 hours. HbA1C: No results for input(s): HGBA1C in the last 72 hours. CBG:  Recent Labs Lab 08/21/16 1203 08/21/16 1750 08/21/16 2031  GLUCAP 129* 132* 141*   Lipid Profile: No results for input(s): CHOL, HDL, LDLCALC, TRIG, CHOLHDL, LDLDIRECT in the last 72 hours. Thyroid Function Tests: No results for input(s): TSH, T4TOTAL, FREET4, T3FREE, THYROIDAB in the last 72 hours. Anemia Panel: No results for input(s): VITAMINB12, FOLATE, FERRITIN, TIBC, IRON, RETICCTPCT in the last 72 hours. Urine analysis:    Component Value Date/Time   COLORURINE AMBER (A) 08/21/2016  1239   APPEARANCEUR CLOUDY (A) 08/21/2016 1239   LABSPEC 1.025 08/21/2016 1239   PHURINE 5.5 08/21/2016 1239   GLUCOSEU 100 (A) 08/21/2016 1239   HGBUR NEGATIVE 08/21/2016 1239   BILIRUBINUR MODERATE (A) 08/21/2016 1239   KETONESUR NEGATIVE 08/21/2016 1239   PROTEINUR 30 (A) 08/21/2016 1239   UROBILINOGEN 2.0 (H) 06/25/2015 1746   NITRITE POSITIVE (A) 08/21/2016 1239   LEUKOCYTESUR TRACE (A) 08/21/2016 1239   Sepsis Labs: @LABRCNTIP (procalcitonin:4,lacticidven:4)  ) Recent Results (from the past 240 hour(s))  Urine culture  Status: None   Collection Time: 08/21/16 12:39 PM  Result Value Ref Range Status   Specimen Description URINE, RANDOM  Final   Special Requests NONE  Final   Culture NO GROWTH [facility]formed [facility]   Final   Report Status 08/23/2016 FINAL  Final  Blood culture (routine x 2)     Status: None (Preliminary result)   Collection Time: 08/21/16 12:49 PM  Result Value Ref Range Status   Specimen Description BLOOD RIGHT FOREARM  Final   Special Requests BOTTLES DRAWN AEROBIC AND ANAEROBIC 6CC EACH  Final   Culture NO GROWTH 4 DAYS  Final   Report Status PENDING  Incomplete  Blood culture (routine x 2)     Status: None (Preliminary result)   Collection Time: 08/21/16 12:55 PM  Result Value Ref Range Status   Specimen Description LEFT ANTECUBITAL  Final   Special Requests BOTTLES DRAWN AEROBIC AND ANAEROBIC 6CC EACH  Final   Culture NO GROWTH 4 DAYS  Final   Report Status PENDING  Incomplete  Culture, group A strep     Status: None   Collection Time: 08/21/16  6:55 PM  Result Value Ref Range Status   Specimen Description THROAT  Final   Special Requests NONE  Final   Culture NO GROUP A STREP (S.PYOGENES) ISOLATED  Final   Report Status 08/24/2016 FINAL  Final         Radiology Studies: Pelvis Portable  Result Date: 08/23/2016 CLINICAL DATA:  60 year old female status post internal fixation hip of the left femoral fracture. EXAM:  PORTABLE PELVIS 1-2 VIEWS COMPARISON:  Abdominal CT dated 04/27/2016 FINDINGS: Five intraoperative fluoroscopy spot images of the left hip and 1 radiograph of the pelvis provided. There is a mildly displaced intratrochanteric fracture of the left femur. There is interval placement of and intramedullary rod and transcervical fixation screws. The orthopedic hardware appears intact. There is near complete anatomic alignment of the fracture fragments of the left femoral neck. The bones are osteopenic. There is mild osteoarthritic changes of the hips. There is postsurgical changes of the soft tissues of the left lateral pelvis with small pockets of soft tissue air. IMPRESSION: Internal fixation of left femoral intertrochanteric fracture. The fracture fragments appear in anatomic alignment. Electronically Signed   By: Elgie CollardArash  Radparvar M.D.   On: 08/23/2016 23:53   Dg C-arm 1-60 Min  Result Date: 08/23/2016 CLINICAL DATA:  Left femur fracture EXAM: DG C-ARM 61-120 MIN; LEFT FEMUR 2 VIEWS COMPARISON:  08/21/2016 FINDINGS: Five low resolution intraoperative spot films of the left femur are submitted. Total fluoroscopy time was 1 minutes 47 seconds. Images were obtained during intramedullary rod fixation of a left trochanteric fracture. There is anatomic alignment. IMPRESSION: Intraoperative fluoroscopic assistance provided during intramedullary rod fixation of proximal left femur fracture Electronically Signed   By: Jasmine PangKim  Fujinaga M.D.   On: 08/23/2016 23:47   Dg Femur Min 2 Views Left  Result Date: 08/23/2016 CLINICAL DATA:  Left femur fracture EXAM: DG C-ARM 61-120 MIN; LEFT FEMUR 2 VIEWS COMPARISON:  08/21/2016 FINDINGS: Five low resolution intraoperative spot films of the left femur are submitted. Total fluoroscopy time was 1 minutes 47 seconds. Images were obtained during intramedullary rod fixation of a left trochanteric fracture. There is anatomic alignment. IMPRESSION: Intraoperative fluoroscopic assistance  provided during intramedullary rod fixation of proximal left femur fracture Electronically Signed   By: Jasmine PangKim  Fujinaga M.D.   On: 08/23/2016 23:47        Scheduled Meds: .  ampicillin-sulbactam (UNASYN) IV  3 g Intravenous Q8H  . folic acid  1 mg Intravenous Daily  . metoprolol tartrate  25 mg Oral BID  . pantoprazole  40 mg Oral Q0600  . phosphorus  500 mg Oral TID WC & HS  . phytonadione  10 mg Oral Daily  . potassium chloride  40 mEq Oral BID  . thiamine  100 mg Intravenous Daily   Continuous Infusions:   LOS: 4 days    Time spent:    Zannie Cove, MD [facility] Pager [phone]  If 7PM-7AM, please contact night-coverage [email or web address] Password [facility] 08/25/2016, 12:38 PM

## 2016-08-25 NOTE — Progress Notes (Signed)
Occupational Therapy Evaluation Patient Details Name: Debbie ElseDeborah C Paladino MRN: [id number] DOB: 1956/03/06 Today's Date: 08/25/2016    History of Present Illness 60 yo presented to [facility] ED 12/11 after her son found her in her home, lying in her bed covered in feces.  She apparently had a mechanical fall 3 days prior and had been lying in bed since that time. [facility] Underwent ORIF for L hip fx 12/13. Pt with significant PMH of ETOH abuse; alcoholic cirrhosis, alcohol dementia, chronic GI bleeding.    Clinical Impression   PTA, pt lived at home and was independent with mobility and required min A with LB ADL. Pt with significant functional decline and [facility]. Pt with multiple incontinent episodes during session. BP supine 120/62. Sitting 96/50. Pt symptomatic and returned to supine. Pt desats to 87 on RA @ bed level. Will follow acutely to address established goals and facilitate DC to SNF for rehab.     Follow Up Recommendations  SNF;Supervision/Assistance - 24 hour    Equipment Recommendations  3 in 1 bedside commode    Recommendations for Other Services       Precautions / Restrictions Precautions Precautions: Fall Restrictions Weight Bearing Restrictions: Yes LLE Weight Bearing: Non weight bearing      Mobility Bed Mobility Overal bed mobility: Needs Assistance Bed Mobility: Sit to Supine;Supine to Sit     Supine to sit: Mod assist;+2 for physical assistance;HOB elevated Sit to supine: Total assist;+2 for physical assistance      Transfers Overall transfer level: Needs assistance Equipment used: Rolling walker (2 wheeled) Transfers: Sit to/from Stand Sit to Stand: Max assist;+2 physical assistance         General transfer comment: Pt unable to achieve full upright posture. incontinenet again. orthostatic    Balance Overall balance assessment: History of Falls;Needs assistance   Sitting balance-Leahy Scale: Fair        Standing balance-Leahy Scale: Poor                              ADL Overall ADL's : Needs assistance/impaired     Grooming: Set up   Upper Body Bathing: Set up;Bed level   Lower Body Bathing: Maximal assistance;Bed level   Upper Body Dressing : Moderate assistance   Lower Body Dressing: Maximal assistance       Toileting- Clothing Manipulation and Hygiene: Total assistance       Functional mobility during ADLs: Maximal assistance;+2 for physical assistance;Rolling walker       Vision     Perception     Praxis      Pertinent Vitals/Pain Pain Assessment: Faces Faces Pain Scale: Hurts even more Pain Location: L hip Pain Descriptors / Indicators: Aching;Discomfort;Grimacing;Moaning Pain Intervention(s): Limited activity within patient's tolerance;Repositioned     Hand Dominance Right   Extremity/Trunk Assessment Upper Extremity Assessment Upper Extremity Assessment: Generalized weakness   Lower Extremity Assessment Lower Extremity Assessment: Defer to PT evaluation   Cervical / Trunk Assessment Cervical / Trunk Assessment: Kyphotic   Communication Communication Communication: No difficulties   Cognition Arousal/Alertness: Awake/alert Behavior During Therapy: Flat affect Overall Cognitive Status: History of cognitive impairments - at baseline                     General Comments       Exercises Exercises: Other exercises Other Exercises Other Exercises: encouraged daughter to have pt complete BUE  AROM and RLE AROM at bed level   Shoulder Instructions      Home Living Family/patient expects to be discharged to:: Skilled nursing facility                                        Prior Functioning/Environment Level of Independence: Needs assistance  Gait / Transfers Assistance Needed: no AD ADL's / Homemaking Assistance Needed: daughter assisted with pt stepping over tub and with LB ADL as needed             OT Problem List: Decreased strength;Decreased range of motion;Decreased activity tolerance;Impaired balance (sitting and/or standing);Decreased cognition;Decreased safety awareness;Decreased knowledge of use of DME or AE;Decreased knowledge of precautions;Pain   OT Treatment/Interventions: Self-care/ADL training;Therapeutic exercise;DME and/or AE instruction;Therapeutic activities;Cognitive remediation/compensation;Patient/family education;Balance training    OT Goals(Current goals can be found in the care plan section) Acute Rehab OT Goals Patient Stated Goal: to get better OT Goal Formulation: With patient Time For Goal Achievement: 09/08/16 Potential to Achieve Goals: Good ADL Goals Pt Will Perform Lower Body Bathing: with mod assist;sit to/from stand Pt Will Transfer to Toilet: with mod assist;bedside commode;stand pivot transfer Pt/caregiver will Perform Home Exercise Program: Increased strength;Both right and left upper extremity;With theraband;With Supervision  OT Frequency: Min 2X/week   Barriers to D/C:            Co-evaluation              End of Session Equipment Utilized During Treatment: Gait belt;Rolling walker;Oxygen (2L) Nurse Communication: Mobility status;Precautions;Weight bearing status  Activity Tolerance: Treatment limited secondary to medical complications (Comment) (orthostatic) Patient left: in bed;with call bell/phone within reach;with family/visitor present;with SCD's reapplied   Time: 1000-1053 OT Time Calculation (min): 53 min Charges:  OT General Charges $OT Visit: 1 Procedure OT Evaluation $OT Eval Moderate Complexity: 1 Procedure OT Treatments $Self Care/Home Management : 38-52 mins G-Codes:    Quamaine Webb,HILLARY 08/25/2016, 11:11 AM   Luisa DagoHilary Shabre Kreher, OT/L  [phone] 08/25/2016

## 2016-08-25 NOTE — Progress Notes (Addendum)
Orthopedic Tech Progress Note Patient Details:  Elmon ElseDeborah C Walgren Sep 06, 1956 [id number]  CPM Left Knee CPM Left Knee: On Left Knee Flexion (Degrees): 30 Left Knee Extension (Degrees): 0   Jennye MoccasinHughes, Carreen Milius Craig 08/25/2016, 4:21 PM Start time 1620

## 2016-08-25 NOTE — Progress Notes (Signed)
Orthopedic Tech Progress Note Patient Details:  Elmon ElseDeborah C Dulay June 17, 1956 [id number] Off cpm at 1740 Patient ID: Elmon Elseeborah C Ringwald, female   DOB: June 17, 1956, 60 y.o.   MRN: [id number]   Jennye MoccasinHughes, Xavyer Steenson Craig 08/25/2016, 5:43 PM

## 2016-08-25 NOTE — Progress Notes (Signed)
Change dressing to new aquacell Ok to wb left leg for transfers only cpm left leg Remove ace left leg

## 2016-08-25 NOTE — Progress Notes (Signed)
Pharmacy Antibiotic Note  [name] is a 60 y.o. female admitted on 08/21/2016 with aspiration pneumonia.  Pharmacy has been consulted for Unasyn dosing.   Day #5 of abx for aspiration pna. CXR shows infiltrate. PCT was elevated at 5.27. Afebrile, WBC wnl.  Plan: Continue Unasyn 3g IV q8h Monitor clinical picture, renal function F/U LOT  Consider change to PO Augmentin today  Weight: 140 lb 14.4 oz (63.9 kg)  Temp (24hrs), Avg:98.1 F (36.7 C), Min:97.5 F (36.4 C), Max:98.6 F (37 C)   Recent Labs Lab 08/21/16 1520 08/21/16 2130 08/22/16 0100 08/22/16 0640 08/22/16 1500 08/23/16 0200 08/23/16 0300 08/23/16 0330 08/24/16 0200 08/25/16 0427  WBC  --   --  14.7*  --  10.3  --   --  7.8 5.6 7.9  CREATININE  --  1.93* 1.84* 1.70* 1.61* 1.48*  --   --  1.17* 0.95  LATICACIDVEN 15.0* 8.4* 4.7*  --  1.9  --  1.2  --   --   --     Estimated Creatinine Clearance: 60.4 mL/min (by C-G formula based on SCr of 0.95 mg/dL).    Allergies  Allergen Reactions  . Loratadine Rash    Antimicrobials this admission:  Zosyn x1 12/11 Vanc x1 12/11 Unasyn 12/12 >>  Dose adjustments this admission:  n/a  Microbiology results:  12/11 BCx: ngtd  12/11 UCx: ngtd  12/11 Group A strep: negative 12/11 MRSA PCR: ?  Thank you for allowing pharmacy to be a part of this patient's care.  Enzo BiNathan Tirzah Fross, PharmD, BCPS Clinical Pharmacist Pager [phone] 08/25/2016 10:43 AM

## 2016-08-25 NOTE — Evaluation (Signed)
Physical Therapy Evaluation Patient Details Name: [name] MRN: [id number] DOB: 1956/02/18 Today's Date: 08/25/2016   History of Present Illness  Pt is a 60 y/o female who presented to [facility] ED 12/11 after her son found her in her home, lying in her bed covered in feces.  She apparently had a mechanical fall 3 days prior and had been lying in bed since that time. [facility] Underwent ORIF for L hip fx 12/13. Pt with significant PMH of ETOH abuse; alcoholic cirrhosis, alcohol dementia, chronic GI bleeding.   Clinical Impression  Pt presented supine in bed with HOB elevated, awake and willing to participate in therapy session. Prior to admission, pt reported that she was independent with ambulation but required assistance from daughter for ADLs. Pt currently requires mod A x2 for bed mobility and max A x2 for transfers (lateral scoot from bed to chair). Pt's BP decreased to 90/63 mmHg in sitting. After sitting for approximately two minutes, pt's BP increased up to her resting BP prior to initiation of evaluation (110/70's mmHg). All other VSS throughout. Pt would continue to benefit from skilled physical therapy services at this time while admitted and after d/c to address her below listed limitations in order to improve her overall safety and independence with functional mobility.     Follow Up Recommendations SNF;Supervision/Assistance - 24 hour    Equipment Recommendations  None recommended by PT;Other (comment) (defer to next venue)    Recommendations for Other Services       Precautions / Restrictions Precautions Precautions: Fall Restrictions Weight Bearing Restrictions: Yes LLE Weight Bearing: Non weight bearing      Mobility  Bed Mobility Overal bed mobility: Needs Assistance Bed Mobility: Supine to Sit     Supine to sit: Mod assist;+2 for physical assistance;HOB elevated     General bed mobility comments: pt required increased time, use of bed rails, verbal  and tactile cueing for sequencing and mod A x2 to achieve sitting EOB  Transfers Overall transfer level: Needs assistance Equipment used: None Transfers: Lateral/Scoot Transfers          Lateral/Scoot Transfers: Max assist;+2 physical assistance General transfer comment: pt able to assist with transfer to recliner from bed with bilateral UEs, max A x2 with use of bed pad.  Ambulation/Gait                Stairs            Wheelchair Mobility    Modified Rankin (Stroke Patients Only)       Balance Overall balance assessment: Needs assistance;History of Falls Sitting-balance support: Feet supported;Bilateral upper extremity supported Sitting balance-Leahy Scale: Poor Sitting balance - Comments: pt initially required mod A to maintain upright sitting balance but able to progress to min guard; however, still needing bilateral UEs                                     Pertinent Vitals/Pain Pain Assessment: Faces Faces Pain Scale: Hurts even more Pain Location: L hip Pain Descriptors / Indicators: Aching;Discomfort;Grimacing;Moaning Pain Intervention(s): Monitored during session;Repositioned    Home Living Family/patient expects to be discharged to:: Skilled nursing facility                      Prior Function Level of Independence: Needs assistance   Gait / Transfers Assistance Needed: pt reported that she did not use  an assistive device, but daughter stated that she "furniture cruised"  ADL's / Homemaking Assistance Needed: daughter assisted with pt stepping over tub and with LB ADL as needed        Hand Dominance   Dominant Hand: Right    Extremity/Trunk Assessment   Upper Extremity Assessment Upper Extremity Assessment: Defer to OT evaluation    Lower Extremity Assessment Lower Extremity Assessment: LLE deficits/detail LLE Deficits / Details: pt with decreased strength and ROM limitations secondary to pain and post-op.  Sensation diminished in foot as compared to R. LLE: Unable to fully assess due to pain    Cervical / Trunk Assessment Cervical / Trunk Assessment: Kyphotic  Communication   Communication: No difficulties  Cognition Arousal/Alertness: Awake/alert Behavior During Therapy: Flat affect Overall Cognitive Status: History of cognitive impairments - at baseline                      General Comments      Exercises General Exercises - Lower Extremity Ankle Circles/Pumps: AROM;Both;15 reps;Seated   Assessment/Plan    PT Assessment Patient needs continued PT services  PT Problem List Decreased strength;Decreased range of motion;Decreased activity tolerance;Decreased balance;Decreased mobility;Decreased coordination;Decreased knowledge of use of DME;Decreased safety awareness;Pain          PT Treatment Interventions DME instruction;Gait training;Stair training;Functional mobility training;Therapeutic activities;Therapeutic exercise;Balance training;Neuromuscular re-education;Patient/family education    PT Goals (Current goals can be found in the Care Plan section)  Acute Rehab PT Goals Patient Stated Goal: to get better PT Goal Formulation: With patient/family Time For Goal Achievement: 09/08/16 Potential to Achieve Goals: Fair    Frequency Min 2X/week   Barriers to discharge        Co-evaluation               End of Session   Activity Tolerance: Patient limited by pain Patient left: in chair;with call bell/phone within reach;with chair alarm set;with family/visitor present Nurse Communication: Mobility status         Time: [id number] PT Time Calculation (min) (ACUTE ONLY): 34 min   Charges:   PT Evaluation $PT Eval Moderate Complexity: 1 Procedure PT Treatments $Therapeutic Activity: 8-22 mins   PT G CodesAlessandra Valentine:        [name] 08/25/2016, 2:46 PM Debbie ChalkJennifer Sahand Valentine, PT, DPT [phone]

## 2016-08-25 NOTE — Progress Notes (Signed)
Admission note:  Arrival Method: Patient arrived in bed from 4E accompanied by family and staff. Mental Orientation: Alert and oriented x 4 but very delayed response and low voice. Telemetry: 6E-30, NSR CCMD notified. Assessment: See doc flow sheets. Skin: Surgical incision on the left with dressing present, bruises noted on bilateral arms and legs d/t fall per daughter. IV: RIJ triple lumen, and Right forearm saline lock. Pain: 6/10, given oxycodone. Tubes: Foley draining pink tinge urine.  Safety Measures: Bed in low position, bed alarm on, and phone and call bell within reach. Fall Prevention Safety Plan: Reviewed the plan, she said "I am not going to get out". 6700 Orientation: Patient has been oriented to the unit, staff and to the room.

## 2016-08-26 LAB — BASIC METABOLIC PANEL
Anion gap: 10 (ref 5–15)
BUN: 15 mg/dL (ref 6–20)
CO2: 29 mmol/L (ref 22–32)
Calcium: 7.2 mg/dL — ABNORMAL LOW (ref 8.9–10.3)
Chloride: 103 mmol/L (ref 101–111)
Creatinine, Ser: 0.82 mg/dL (ref 0.44–1.00)
GFR calc Af Amer: >60 mL/min (ref >60)
GFR calc non Af Amer: >60 mL/min (ref >60)
GLUCOSE: 104 mg/dL — AB (ref 65–99)
POTASSIUM: 3.1 mmol/L — AB (ref 3.5–5.1)
Sodium: 142 mmol/L (ref 135–145)

## 2016-08-26 LAB — CBC WITH DIFFERENTIAL/PLATELET
BASOS ABS: 0 10*3/uL (ref 0.0–0.1)
Basophils Relative: 0 %
EOS ABS: 0.3 10*3/uL (ref 0.0–0.7)
Eosinophils Relative: 3 %
HCT: 26.1 % — ABNORMAL LOW (ref 36.0–46.0)
Hemoglobin: 8.4 g/dL — ABNORMAL LOW (ref 12.0–15.0)
LYMPHS PCT: 25 %
Lymphs Abs: 2.2 10*3/uL (ref 0.7–4.0)
MCH: 32.6 pg (ref 26.0–34.0)
MCHC: 32.2 g/dL (ref 30.0–36.0)
MCV: 101.2 fL — ABNORMAL HIGH (ref 78.0–100.0)
MONO ABS: 1.2 10*3/uL — AB (ref 0.1–1.0)
Monocytes Relative: 14 %
NEUTROS PCT: 58 %
Neutro Abs: 5.2 10*3/uL (ref 1.7–7.7)
PLATELETS: 49 10*3/uL — AB (ref 150–400)
RBC: 2.58 MIL/uL — ABNORMAL LOW (ref 3.87–5.11)
RDW: 25.6 % — AB (ref 11.5–15.5)
WBC: 8.9 10*3/uL (ref 4.0–10.5)

## 2016-08-26 LAB — TYPE AND SCREEN
BLOOD PRODUCT EXPIRATION DATE: 201712202359
BLOOD PRODUCT EXPIRATION DATE: 201712252359
Blood Product Expiration Date: 201712262359
ISSUE DATE / TIME: 201712130903
ISSUE DATE / TIME: 201712132118
ISSUE DATE / TIME: 201712132118
UNIT TYPE AND RH: 600
UNIT TYPE AND RH: 600
Unit Type and Rh: 600

## 2016-08-26 LAB — CULTURE, BLOOD (ROUTINE X 2)
CULTURE: NO GROWTH
Culture: NO GROWTH

## 2016-08-26 LAB — PROTIME-INR
INR: 1.65
PROTHROMBIN TIME: 19.7 s — AB (ref 11.4–15.2)

## 2016-08-26 LAB — PHOSPHORUS: Phosphorus: 1.6 mg/dL — ABNORMAL LOW (ref 2.5–4.6)

## 2016-08-26 LAB — MAGNESIUM: MAGNESIUM: 1.9 mg/dL (ref 1.7–2.4)

## 2016-08-26 MED ORDER — SORBITOL 70 % SOLN
960.0000 mL | TOPICAL_OIL | Freq: Once | ORAL | Status: DC
  Filled 2016-08-26: qty 240

## 2016-08-26 MED ORDER — SODIUM CHLORIDE 0.9% FLUSH: 10.0000 mL | Freq: Two times a day (BID) | INTRAVENOUS | Status: DC

## 2016-08-26 MED ORDER — SODIUM CHLORIDE 0.9% FLUSH: 10.0000 mL | INTRAVENOUS | Status: DC | PRN

## 2016-08-26 MED ORDER — VITAMIN B-1 100 MG PO TABS
100.0000 mg | ORAL_TABLET | Freq: Every day | ORAL | Status: DC
  Administered 2016-08-28 – 2016-08-31 (×4): 100 mg via ORAL
  Filled 2016-08-26 (×4): qty 1

## 2016-08-26 MED ORDER — AMOXICILLIN-POT CLAVULANATE 875-125 MG PO TABS
1.0000 | ORAL_TABLET | Freq: Two times a day (BID) | ORAL | Status: DC
  Administered 2016-08-27 – 2016-08-31 (×9): 1 via ORAL
  Filled 2016-08-26 (×10): qty 1

## 2016-08-26 MED ORDER — POTASSIUM CHLORIDE CRYS ER 20 MEQ PO TBCR: 40.0000 meq | EXTENDED_RELEASE_TABLET | Freq: Two times a day (BID) | ORAL | Status: AC

## 2016-08-26 NOTE — Progress Notes (Signed)
Patient refused enema.  However, she is more cooperative.  Assisted to side of bed to sit and attempt dinner and drink sprite.  Will continue to monitor and encourage patient.  [employer] [employer] MinorKimberly Chinedum Vanhouten RN-BC, WTA.

## 2016-08-26 NOTE — Progress Notes (Signed)
Encouraged patient to eat lunch; patient refused. Continuing to encourage and explain importance of taking scheduled medications as ordered. Patient continues to refuse all medications at this time.

## 2016-08-26 NOTE — Progress Notes (Addendum)
Patient's family is at bedside and is stating that patient is delusional. Patient states that we are "out to get her" and seems suspicious of staff including this Rn. MD has been notified. Awaiting MD response.  Spoke with Md. No new orders at this time.

## 2016-08-26 NOTE — Progress Notes (Signed)
Patient refused 0800 medication. This RN explained importance of medication; patient again refused.

## 2016-08-26 NOTE — Plan of Care (Signed)
Problem: Health Behavior/Discharge Planning: Goal: Ability to manage health-related needs will improve Outcome: Not Progressing Patient is refusing all medications, MD aware.   Problem: Pain Managment: Goal: General experience of comfort will improve Outcome: Progressing Patient denies pain or need of pain medication.  Problem: Physical Regulation: Goal: Will remain free from infection Outcome: Not Progressing Patient currently refusing medications including antibiotics despite education and encouragement from this RN and family. MD aware.   Problem: Skin Integrity: Goal: Risk for impaired skin integrity will decrease Outcome: Not Progressing Patient is currently refusing to turn every two hours despite education and encouragement from this Rn, nurse tech, and family.

## 2016-08-26 NOTE — Progress Notes (Signed)
PROGRESS NOTE    [name]  OZH:[id number] DOB: February 23, 1956 DOA: 08/21/2016 PCP: Vertis KelchALLEN, DEBRA, NP  Brief Narrative PCCM Transfer 12/13 60/F with Alcoholism and tobacco abuse, She presented to [facility] ED 12/11 after her son found her in her home, lying in her bed covered in feces.  She apparently had a mechanical fall 3 days prior. Imaging in ED revealed intertrochanteric hip fx.  She also had positive FOBT and reported that she had been experiencing hematemesis as well as melena, long standing hx of significant ETOH intake (she reports "three 1/2 gallons of liquor per day). Admitted to ICU per PCCM, had EGD: unremarkable, portal gastropathy, given 2U PRBC, Troponin 2, Ortho consulted and [facility].  Assessment & Plan:   Active Problems:   Anemia/Upper GI bleed -admitted with Hb of 5.8 in background of chronic alcoholism -s/p 3units PRBC and 2units FFP -EGD with portal gastropathy, no varices and no active bleeding -continue Po PPI now -Hb stable, improved    Left hip intertrochanteric fracture  -Ortho consulted, s/p IM Nail 12/13 -ECHO with normal EF and wall motion -appreciate Cards input, started low dose BB -PT consulting, SNF for rehab  Elevated troponin -likely due to demand from severe anemia, no known CAD -peaked at 2 -ECHO with normal Wall motion and EF,  -appreciate Cards input, continue Coreg  Alcoholism -long standing -continue thiamine, monitor for CIWA -per daughter 35-40years -no overt withdrawal yet  Aspiration pneumonia -changed unasyn to augmentin -cultures negative, aspiration precautions -improving  Chronic liver failure with coagulopathy/thrombocytopenia -due to alcohol -s/p FFP, monitor  AKi  -baseline creatinine normal -improved  DVT prophylaxis:SCDS, INR up Code Status:Full Code Family Communication:daughter at bedside 12/15 Disposition Plan:  SNF Monday if stable  Consultants:    GI  Ortho  PCCM  Cards   Procedures:ECHO   Antimicrobials: Unasyn D4-> augmentin 12/15   Subjective: pain in L hip, feels better today  Objective: Vitals:   08/25/16 2300 08/26/16 0444 08/26/16 0451 08/26/16 0945  BP:   (!) 116/54 (!) 95/50  Pulse:   85 72  Resp:   20 18  Temp:   98.1 F (36.7 C) 98.7 F (37.1 C)  TempSrc:   Oral Oral  SpO2:   98% 91%  Weight:  63.9 kg (140 lb 14 oz)    Height: 5\' 6"  (1.676 m)       Intake/Output Summary (Last 24 hours) at 08/26/16 1112 Last data filed at 08/26/16 1021  Gross per 24 hour  Intake              660 ml  Output              978 ml  Net             -318 ml   Filed Weights   08/24/16 0344 08/25/16 0500 08/26/16 0444  Weight: 64 kg (141 lb 1.5 oz) 63.9 kg (140 lb 14.4 oz) 63.9 kg (140 lb 14 oz)    Examination:  General exam: Appears uncomfortable no distress, AAOx2 Respiratory system: Clear to auscultation. Respiratory effort normal. Cardiovascular system: S1 & S2 heard, RRR. No JVD, murmurs, rubs, gallops or clicks. No pedal edema. Gastrointestinal system: Abdomen is nondistended, soft and nontender. Normal bowel sounds heard. Central nervous system: Alert and oriented. No focal neurological deficits. Extremities:L hip with dressing Skin: No rashes, lesions or ulcers Psychiatry: Judgement and insight appear normal. Mood & affect appropriate.     Data  Reviewed: I have personally reviewed following labs and imaging studies  CBC:  Recent Labs Lab 08/21/16 1249  08/22/16 1500 08/23/16 0330 08/24/16 0200 08/25/16 0427 08/26/16 0520  WBC 16.2*  < > 10.3 7.8 5.6 7.9 8.9  NEUTROABS 13.6*  --   --  5.6 4.2 5.2 5.2  HGB 6.3*  < > 7.8* 7.5* 9.6* 8.2* 8.4*  HCT 21.1*  < > 24.1* 23.3* 28.9* 25.0* 26.1*  MCV 133.5*  < > 105.2* 105.0* 97.0 96.5 101.2*  PLT 86*  < > 112* 89* 54* 51* 49*  < > = values in this interval not displayed. Basic Metabolic Panel:  Recent Labs Lab 08/22/16 0100 08/22/16 0640  08/22/16 1500 08/23/16 0200 08/23/16 0330 08/24/16 0200 08/25/16 0427 08/26/16 0520  NA 135 137 139 139  --  140 142 142  K 3.1* 3.8 3.5 3.1*  --  2.8* 3.3* 3.1*  CL 102 104 107 107  --  102 103 103  CO2 25 23 23 25   --  29 33* 29  GLUCOSE 104* 102* 152* 151*  --  146* 118* 104*  BUN 14 14 18  24*  --  26* 23* 15  CREATININE 1.84* 1.70* 1.61* 1.48*  --  1.17* 0.95 0.82  CALCIUM 6.9* 6.8* 7.0* 7.0*  --  7.0* 7.2* 7.2*  MG 1.4* 2.1  --   --  2.1 1.6* 2.1 1.9  PHOS 2.4*  --   --   --  <1.0* 1.6* 1.3* 1.6*   GFR: Estimated Creatinine Clearance: 70 mL/min (by C-G formula based on SCr of 0.82 mg/dL). Liver Function Tests:  Recent Labs Lab 08/21/16 1249 08/22/16 0405 08/23/16 0330 08/24/16 0200  AST 221* 205* 182* 145*  ALT 54 42 45 45  ALKPHOS 92 61 76 82  BILITOT 8.1* 6.9* 5.3* 6.5*  PROT 6.7 5.9* 6.3* 6.4*  ALBUMIN 2.1* 1.8* 1.8* 1.9*    Recent Labs Lab 08/21/16 1249  LIPASE 56*    Recent Labs Lab 08/21/16 1249 08/22/16 0405  AMMONIA 64* 50*   Coagulation Profile:  Recent Labs Lab 08/23/16 0330 08/23/16 1814 08/24/16 0200 08/25/16 0427 08/26/16 0520  INR 2.18 1.65 1.93 1.60 1.65   Cardiac Enzymes:  Recent Labs Lab 08/21/16 1256 08/21/16 1302 08/21/16 2130 08/22/16 0405 08/23/16 0330  CKTOTAL  --  425*  --   --   --   TROPONINI 1.60*  --  1.66* 2.10* 2.19*   BNP (last 3 results) No results for input(s): PROBNP in the last 8760 hours. HbA1C: No results for input(s): HGBA1C in the last 72 hours. CBG:  Recent Labs Lab 08/21/16 1203 08/21/16 1750 08/21/16 2031  GLUCAP 129* 132* 141*   Lipid Profile: No results for input(s): CHOL, HDL, LDLCALC, TRIG, CHOLHDL, LDLDIRECT in the last 72 hours. Thyroid Function Tests: No results for input(s): TSH, T4TOTAL, FREET4, T3FREE, THYROIDAB in the last 72 hours. Anemia Panel: No results for input(s): VITAMINB12, FOLATE, FERRITIN, TIBC, IRON, RETICCTPCT in the last 72 hours. Urine analysis:     Component Value Date/Time   COLORURINE AMBER (A) 08/21/2016 1239   APPEARANCEUR CLOUDY (A) 08/21/2016 1239   LABSPEC 1.025 08/21/2016 1239   PHURINE 5.5 08/21/2016 1239   GLUCOSEU 100 (A) 08/21/2016 1239   HGBUR NEGATIVE 08/21/2016 1239   BILIRUBINUR MODERATE (A) 08/21/2016 1239   KETONESUR NEGATIVE 08/21/2016 1239   PROTEINUR 30 (A) 08/21/2016 1239   UROBILINOGEN 2.0 (H) 06/25/2015 1746   NITRITE POSITIVE (A) 08/21/2016 1239   LEUKOCYTESUR TRACE (A)  08/21/2016 1239   Sepsis Labs: @LABRCNTIP (procalcitonin:4,lacticidven:4)  ) Recent Results (from the past 240 hour(s))  Urine culture     Status: None   Collection Time: 08/21/16 12:39 PM  Result Value Ref Range Status   Specimen Description URINE, RANDOM  Final   Special Requests NONE  Final   Culture NO GROWTH Performed at [facility]   Final   Report Status 08/23/2016 FINAL  Final  Blood culture (routine x 2)     Status: None (Preliminary result)   Collection Time: 08/21/16 12:49 PM  Result Value Ref Range Status   Specimen Description BLOOD RIGHT FOREARM  Final   Special Requests BOTTLES DRAWN AEROBIC AND ANAEROBIC 6CC EACH  Final   Culture NO GROWTH 4 DAYS  Final   Report Status PENDING  Incomplete  Blood culture (routine x 2)     Status: None (Preliminary result)   Collection Time: 08/21/16 12:55 PM  Result Value Ref Range Status   Specimen Description LEFT ANTECUBITAL  Final   Special Requests BOTTLES DRAWN AEROBIC AND ANAEROBIC 6CC EACH  Final   Culture NO GROWTH 4 DAYS  Final   Report Status PENDING  Incomplete  Culture, group A strep     Status: None   Collection Time: 08/21/16  6:55 PM  Result Value Ref Range Status   Specimen Description THROAT  Final   Special Requests NONE  Final   Culture NO GROUP A STREP (S.PYOGENES) ISOLATED  Final   Report Status 08/24/2016 FINAL  Final         Radiology Studies: No results found.      Scheduled Meds: . amoxicillin-clavulanate  1 tablet Oral Q12H   . folic acid  1 mg Intravenous Daily  . metoprolol tartrate  25 mg Oral BID  . pantoprazole  40 mg Oral Q0600  . phosphorus  500 mg Oral TID WC & HS  . potassium chloride  40 mEq Oral BID  . sodium chloride flush  10-40 mL Intracatheter Q12H  . sorbitol, milk of mag, mineral oil, glycerin (SMOG) enema  960 mL Rectal Once  . thiamine  100 mg Oral Daily   Continuous Infusions:   LOS: 5 days    Time spent:    Zannie Cove, MD [facility] Pager [phone]  If 7PM-7AM, please contact night-coverage [email or web address] Password [email or web address] 08/26/2016, 11:12 AM

## 2016-08-26 NOTE — Progress Notes (Signed)
Called Ortho at approximately 1530 for CPM placement.  Per ortho tech, patient refused placement of CPM.  Ortho to try again this evening.

## 2016-08-26 NOTE — Progress Notes (Signed)
Patient seems less paranoid; pleasant. Continues to refuse medications despite education and encouragement. Will continue to monitor.

## 2016-08-26 NOTE — Progress Notes (Signed)
MD has been notified that patient is refusing all medications until her family arrives at bedside. This RN has educated patient about medications scheduled and attempted to calm any apprehensions about taking medications.  Patient still refuses. IV nurse (debra G) at bedside during conversation.

## 2016-08-26 NOTE — Progress Notes (Signed)
Patient has not voided in 8 hours post foley cathter discontinue.  Bladder scan revealed . MD notified. Order obtained for foley catheter insertion for urinary retention.

## 2016-08-27 LAB — CBC
HCT: 26.1 % — ABNORMAL LOW (ref 36.0–46.0)
Hemoglobin: 8.4 g/dL — ABNORMAL LOW (ref 12.0–15.0)
MCH: 32.7 pg (ref 26.0–34.0)
MCHC: 32.2 g/dL (ref 30.0–36.0)
MCV: 101.6 fL — AB (ref 78.0–100.0)
PLATELETS: 62 10*3/uL — AB (ref 150–400)
RBC: 2.57 MIL/uL — ABNORMAL LOW (ref 3.87–5.11)
RDW: 25.5 % — AB (ref 11.5–15.5)
WBC: 10 10*3/uL (ref 4.0–10.5)

## 2016-08-27 LAB — PROTIME-INR
INR: 1.7
PROTHROMBIN TIME: 20.2 s — AB (ref 11.4–15.2)

## 2016-08-27 MED ORDER — FOLIC ACID 1 MG PO TABS
1.0000 mg | ORAL_TABLET | Freq: Every day | ORAL | Status: DC
  Administered 2016-08-28 – 2016-08-31 (×4): 1 mg via ORAL
  Filled 2016-08-27 (×4): qty 1

## 2016-08-27 NOTE — Plan of Care (Signed)
Problem: Health Behavior/Discharge Planning: Goal: Ability to manage health-related needs will improve Outcome: Progressing Patient has refused all medications in the last 24 hours; however, patient agreed to take PO antibiotic with pain medicine today at 1632. See progress note.   Problem: Skin Integrity: Goal: Risk for impaired skin integrity will decrease Outcome: Progressing Patient has agreed to turning and positioning two times today on day shift, which is an improvement from the previous day.

## 2016-08-27 NOTE — Progress Notes (Signed)
Left hip incision site dressings are saturated and leaking.  Surrounding area is swollen, red, and warm to touch.  Ortho on call notified of findings. Dr. Lajoyce Cornersuda ordered dry dressing to be changed daily.

## 2016-08-27 NOTE — Progress Notes (Signed)
During the night, the patient continued to refuse all medications.  She is calm but uncooperative with taking of medications.  She refused bed bath.  She did want sprite and water to drink.  At approximately 0400, it was noticed that she had pulled her right IJ TL partially out.  IV team removed TL out and pressure dressing applied.  Will continue to monitor patient.  [employer] MinorKimberly Staisha Winiarski RN-BC, WTA.

## 2016-08-27 NOTE — Progress Notes (Signed)
Saturated dressings changed and replaced with dry dressing per Dr. Audrie Liauda's order.  Sutures and incision intact.  Sterile gauze applied with ABD pad to reinforce.

## 2016-08-27 NOTE — Progress Notes (Addendum)
Patient continues to refuse medications and refuses scheduled turning and positioning. MD is aware and has spoken with patient. This RN continues to encourage and educate patient on importance of taking medications as prescribed and turning and positioning. Will continue to monitor.   Patient requested pain medication for pain in left hip; morphine IV administered at 1632. This RN was able to encourage patient to take scheduled antibiotic.  Patient agreed, but refused all other scheduled medications at this time.  Patient states that Morphine helped her pain; appreciative.

## 2016-08-27 NOTE — Progress Notes (Signed)
PROGRESS NOTE    [name]  ZOX:[id number] DOB: April 14, 1956 DOA: 08/21/2016 PCP: Vertis Kelch, NP  Brief Narrative PCCM Transfer 12/13 60/F with Alcoholism and tobacco abuse, She presented to [facility] ED 12/11 after her son found her in her home, lying in her bed covered in feces.  She apparently had a mechanical fall 3 days prior. Imaging in ED revealed intertrochanteric hip fx.  She also had positive FOBT and reported that she had been experiencing hematemesis as well as melena, long standing hx of significant ETOH intake (she reports "three 1/2 gallons of liquor per day). Admitted to ICU per PCCM, had EGD: unremarkable, portal gastropathy, given 2U PRBC, Troponin 2, Ortho consulted and transferred to [facility] to step down today 12/13.  Assessment & Plan:   Active Problems:   Anemia/Upper GI bleed -admitted with Hb of 5.8 in background of chronic alcoholism -s/p 3units PRBC and 2units FFP -EGD with portal gastropathy, no varices and no active bleeding -continue Po PPI now -Hb stable, improved    Left hip intertrochanteric fracture  -Ortho consulted, s/p IM Nail 12/13 -ECHO with normal EF and wall motion -appreciate Cards input, started low dose BB -PT consulting, SNF for rehab-pt refusing this now, hopefully her family can convince her  Elevated troponin -likely due to demand from severe anemia, no known CAD -peaked at 2 -ECHO with normal Wall motion and EF,  -appreciate Cards input, continue Coreg  Alcoholism -long standing -continue thiamine, monitor for CIWA -per daughter 35-40years -no overt withdrawal yet  Aspiration pneumonia -changed unasyn to augmentin -cultures negative, aspiration precautions -improving  Chronic liver failure with coagulopathy/thrombocytopenia -due to alcohol -s/p FFP, monitor  AKi  -baseline creatinine normal -improved  DVT prophylaxis:SCDS, INR up Code Status:Full Code Family Communication:daughter at bedside 12/15 Disposition Plan:  SNF  Monday if stable  Consultants:   GI  Ortho  PCCM  Cards   Procedures:ECHO   Antimicrobials: Unasyn D4-> augmentin 12/15   Subjective: Feels ok, refusing all meds, " somethings just not right about this place"  Objective: Vitals:   08/26/16 2100 08/26/16 2312 08/27/16 0500 08/27/16 0957  BP: 107/66 (!) 103/50  100/80  Pulse: 81 79  78  Resp: 16 16  16   Temp: 98.9 F (37.2 C) 99.7 F (37.6 C)  98.6 F (37 C)  TempSrc: Oral Oral  Oral  SpO2: 96% 96%  95%  Weight:  65.8 kg (145 lb) 65.8 kg (145 lb 1 oz)   Height:        Intake/Output Summary (Last 24 hours) at 08/27/16 1147 Last data filed at 08/27/16 0600  Gross per 24 hour  Intake              360 ml  Output              350 ml  Net               10 ml   Filed Weights   08/26/16 0444 08/26/16 2312 08/27/16 0500  Weight: 63.9 kg (140 lb 14 oz) 65.8 kg (145 lb) 65.8 kg (145 lb 1 oz)    Examination:  General exam: Appears calm and comfortable no distress, AAOx2 Respiratory system: Clear to auscultation. Respiratory effort normal. Cardiovascular system: S1 & S2 heard, RRR. No JVD, murmurs, rubs, gallops or clicks. No pedal edema. Gastrointestinal system: Abdomen is nondistended, soft and nontender. Normal bowel sounds heard. Central nervous system: Alert and oriented. No focal neurological deficits. Extremities:L hip with dressing Skin: No  rashes, lesions or ulcers Psychiatry: Judgement and insight appear normal. Mood & affect appropriate.     Data Reviewed: I have personally reviewed following labs and imaging studies  CBC:  Recent Labs Lab 08/21/16 1249  08/23/16 0330 08/24/16 0200 08/25/16 0427 08/26/16 0520 08/27/16 0355  WBC 16.2*  < > 7.8 5.6 7.9 8.9 10.0  NEUTROABS 13.6*  --  5.6 4.2 5.2 5.2  --   HGB 6.3*  < > 7.5* 9.6* 8.2* 8.4* 8.4*  HCT 21.1*  < > 23.3* 28.9* 25.0* 26.1* 26.1*  MCV 133.5*  < > 105.0* 97.0 96.5 101.2* 101.6*  PLT 86*  < > 89* 54* 51* 49* 62*  < > = values in this  interval not displayed. Basic Metabolic Panel:  Recent Labs Lab 08/22/16 0100 08/22/16 0640 08/22/16 1500 08/23/16 0200 08/23/16 0330 08/24/16 0200 08/25/16 0427 08/26/16 0520  NA 135 137 139 139  --  140 142 142  K 3.1* 3.8 3.5 3.1*  --  2.8* 3.3* 3.1*  CL 102 104 107 107  --  102 103 103  CO2 25 23 23 25   --  29 33* 29  GLUCOSE 104* 102* 152* 151*  --  146* 118* 104*  BUN 14 14 18  24*  --  26* 23* 15  CREATININE 1.84* 1.70* 1.61* 1.48*  --  1.17* 0.95 0.82  CALCIUM 6.9* 6.8* 7.0* 7.0*  --  7.0* 7.2* 7.2*  MG 1.4* 2.1  --   --  2.1 1.6* 2.1 1.9  PHOS 2.4*  --   --   --  <1.0* 1.6* 1.3* 1.6*   GFR: Estimated Creatinine Clearance: 70 mL/min (by C-G formula based on SCr of 0.82 mg/dL). Liver Function Tests:  Recent Labs Lab 08/21/16 1249 08/22/16 0405 08/23/16 0330 08/24/16 0200  AST 221* 205* 182* 145*  ALT 54 42 45 45  ALKPHOS 92 61 76 82  BILITOT 8.1* 6.9* 5.3* 6.5*  PROT 6.7 5.9* 6.3* 6.4*  ALBUMIN 2.1* 1.8* 1.8* 1.9*    Recent Labs Lab 08/21/16 1249  LIPASE 56*    Recent Labs Lab 08/21/16 1249 08/22/16 0405  AMMONIA 64* 50*   Coagulation Profile:  Recent Labs Lab 08/23/16 1814 08/24/16 0200 08/25/16 0427 08/26/16 0520 08/27/16 0355  INR 1.65 1.93 1.60 1.65 1.70   Cardiac Enzymes:  Recent Labs Lab 08/21/16 1256 08/21/16 1302 08/21/16 2130 08/22/16 0405 08/23/16 0330  CKTOTAL  --  425*  --   --   --   TROPONINI 1.60*  --  1.66* 2.10* 2.19*   BNP (last 3 results) No results for input(s): PROBNP in the last 8760 hours. HbA1C: No results for input(s): HGBA1C in the last 72 hours. CBG:  Recent Labs Lab 08/21/16 1203 08/21/16 1750 08/21/16 2031  GLUCAP 129* 132* 141*   Lipid Profile: No results for input(s): CHOL, HDL, LDLCALC, TRIG, CHOLHDL, LDLDIRECT in the last 72 hours. Thyroid Function Tests: No results for input(s): TSH, T4TOTAL, FREET4, T3FREE, THYROIDAB in the last 72 hours. Anemia Panel: No results for input(s):  VITAMINB12, FOLATE, FERRITIN, TIBC, IRON, RETICCTPCT in the last 72 hours. Urine analysis:    Component Value Date/Time   COLORURINE AMBER (A) 08/21/2016 1239   APPEARANCEUR CLOUDY (A) 08/21/2016 1239   LABSPEC 1.025 08/21/2016 1239   PHURINE 5.5 08/21/2016 1239   GLUCOSEU 100 (A) 08/21/2016 1239   HGBUR NEGATIVE 08/21/2016 1239   BILIRUBINUR MODERATE (A) 08/21/2016 1239   KETONESUR NEGATIVE 08/21/2016 1239   PROTEINUR 30 (A) 08/21/2016 1239  UROBILINOGEN 2.0 (H) 06/25/2015 1746   NITRITE POSITIVE (A) 08/21/2016 1239   LEUKOCYTESUR TRACE (A) 08/21/2016 1239   Sepsis Labs: @LABRCNTIP (procalcitonin:4,lacticidven:4)  ) Recent Results (from the past 240 hour(s))  Urine culture     Status: None   Collection Time: 08/21/16 12:39 PM  Result Value Ref Range Status   Specimen Description URINE, RANDOM  Final   Special Requests NONE  Final   Culture [facility] GROWTH [facility]   Final   Report Status 08/23/2016 FINAL  Final  Blood culture (routine x 2)     Status: None   Collection Time: 08/21/16 12:49 PM  Result Value Ref Range Status   Specimen Description BLOOD RIGHT FOREARM  Final   Special Requests BOTTLES DRAWN AEROBIC AND ANAEROBIC [facility]6CC EACH  Final   Culture NO GROWTH 5 DAYS  Final   Report Status 08/26/2016 FINAL  Final  Blood culture (routine x 2)     Status: None   Collection Time: 08/21/16 12:55 PM  Result Value Ref Range Status   Specimen Description LEFT ANTECUBITAL  Final   Special Requests BOTTLES DRAWN AEROBIC AND ANAEROBIC [facility]6CC EACH  Final   Culture NO GROWTH 5 DAYS  Final   Report Status 08/26/2016 FINAL  Final  Culture, group A strep     Status: None   Collection Time: 08/21/16  6:55 PM  Result Value Ref Range Status   Specimen Description THROAT  Final   Special Requests NONE  Final   Culture NO GROUP A STREP (S.PYOGENES) ISOLATED  Final   Report Status 08/24/2016 FINAL  Final         Radiology Studies: No results  found.      Scheduled Meds: . amoxicillin-clavulanate  1 tablet Oral Q12H  . folic acid  1 mg Intravenous Daily  . metoprolol tartrate  25 mg Oral BID  . pantoprazole  40 mg Oral Q0600  . phosphorus  500 mg Oral TID WC & HS  . sorbitol, milk of mag, mineral oil, glycerin (SMOG) enema  960 mL Rectal Once  . thiamine  100 mg Oral Daily   Continuous Infusions:   LOS: 6 days    Time spent: 35min    Zannie CovePreetha Mckenzi Buonomo, MD [facility] Pager [phone]  If 7PM-7AM, please contact night-coverage [email or web address] Password [email or web address] 08/27/2016, 11:47 AM

## 2016-08-28 LAB — BASIC METABOLIC PANEL
Anion gap: 9 (ref 5–15)
BUN: 12 mg/dL (ref 6–20)
CHLORIDE: 100 mmol/L — AB (ref 101–111)
CO2: 28 mmol/L (ref 22–32)
CREATININE: 0.76 mg/dL (ref 0.44–1.00)
Calcium: 7 mg/dL — ABNORMAL LOW (ref 8.9–10.3)
GFR calc Af Amer: >60 mL/min (ref >60)
GFR calc non Af Amer: >60 mL/min (ref >60)
GLUCOSE: 111 mg/dL — AB (ref 65–99)
POTASSIUM: 3.2 mmol/L — AB (ref 3.5–5.1)
Sodium: 137 mmol/L (ref 135–145)

## 2016-08-28 LAB — PROTIME-INR
INR: 1.54
Prothrombin Time: 18.6 seconds — ABNORMAL HIGH (ref 11.4–15.2)

## 2016-08-28 MED ORDER — PANTOPRAZOLE SODIUM 40 MG PO TBEC: 40.0000 mg | DELAYED_RELEASE_TABLET | Freq: Every day | ORAL | Status: AC

## 2016-08-28 MED ORDER — POLYETHYLENE GLYCOL 3350 17 G PO PACK: 17.0000 g | PACK | Freq: Every day | ORAL | 0 refills | Status: AC | PRN

## 2016-08-28 MED ORDER — AMOXICILLIN-POT CLAVULANATE 875-125 MG PO TABS: 1.0000 | ORAL_TABLET | Freq: Two times a day (BID) | ORAL | Status: DC

## 2016-08-28 MED ORDER — METOPROLOL TARTRATE 25 MG PO TABS: 25.0000 mg | ORAL_TABLET | Freq: Two times a day (BID) | ORAL | Status: DC

## 2016-08-28 MED ORDER — OXYCODONE HCL 5 MG PO TABS: 5.0000 mg | ORAL_TABLET | ORAL | 0 refills | Status: AC | PRN

## 2016-08-28 MED ORDER — THIAMINE HCL 100 MG PO TABS: 100.0000 mg | ORAL_TABLET | Freq: Every day | ORAL | Status: AC

## 2016-08-28 NOTE — Progress Notes (Signed)
Dr. August Saucerean paged regarding the drainage from the incision site at 11:44 am and left a message.  Will continue to monitor.

## 2016-08-28 NOTE — Progress Notes (Signed)
Physical Therapy Treatment Patient Details Name: Debbie ElseDeborah C Saintjean MRN: [id number] DOB: [birth year] Today's Date: 08/28/2016    History of Present Illness Pt is a 60 y/o female who presented to [facility] ED 12/11 after her son found her in her home, lying in her bed covered in feces.  She apparently had a mechanical fall 3 days prior and had been lying in bed since that time. [facility]. Underwent ORIF for L hip fx 12/13. Pt with significant PMH of ETOH abuse; alcoholic cirrhosis, alcohol dementia, chronic GI bleeding.     PT Comments    Progressing slowly with bed mobility and transfers.  Pt unable to maintain NWB to attempt ambulation and stayed at transfer level.  I think on some level that she know she's not ready to go home, but she will not accept going to rehab yet.   Follow Up Recommendations  SNF;Supervision/Assistance - 24 hour     Equipment Recommendations  None recommended by PT;Other (comment)    Recommendations for Other Services       Precautions / Restrictions Precautions Precautions: Fall Restrictions LLE Weight Bearing: Non weight bearing    Mobility  Bed Mobility Overal bed mobility: Needs Assistance Bed Mobility: Supine to Sit     Supine to sit: Mod assist;+2 for safety/equipment Sit to supine: Max assist   General bed mobility comments: pt needed stability/support while with cues she used her UE's to assist pushing up and forward to EOB.  Transfers Overall transfer level: Needs assistance Equipment used: Rolling walker (2 wheeled) Transfers: Stand Pivot Transfers Sit to Stand: Max assist;+2 physical assistance Stand pivot transfers: Max assist;+2 physical assistance       General transfer comment: pt able to come into stand and maintain stand without w/bearing through the foot, but with 2-3 pivot steps to chair, pt weight onto the supporting therapist's foot partially everytime she "swung" or "pivoted" on her arms.  Ambulation/Gait              General Gait Details: transfer only   Stairs            Wheelchair Mobility    Modified Rankin (Stroke Patients Only)       Balance Overall balance assessment: Needs assistance;History of Falls Sitting-balance support: No upper extremity supported Sitting balance-Leahy Scale: Fair Sitting balance - Comments: pt able to maintain balance without UE assist                            Cognition Arousal/Alertness: Awake/alert Behavior During Therapy: Flat affect Overall Cognitive Status: History of cognitive impairments - at baseline                      Exercises General Exercises - Lower Extremity Long Arc Quad: AROM;Strengthening;Both;10 reps;Seated Heel Slides: AROM;Right;Both;10 reps;Other reps (comment) (graded resistance.) Hip ABduction/ADduction: AROM;Strengthening;10 reps;Supine Hip Flexion/Marching: AROM;AAROM;Left;5 reps    General Comments General comments (skin integrity, edema, etc.): pt very adamant that she will not entertain the thought of going to rehab.  If she is to go home, she will need 24 hour assist for safety with transfers and ADLS.      Pertinent Vitals/Pain Pain Assessment: Faces Faces Pain Scale: Hurts even more Pain Location: L upper femur Pain Descriptors / Indicators: Aching;Grimacing;Moaning Pain Intervention(s): Monitored during session;Repositioned    Home Living  Prior Function            PT Goals (current goals can now be found in the care plan section) Acute Rehab PT Goals Patient Stated Goal: to get better PT Goal Formulation: With patient/family Time For Goal Achievement: 09/08/16 Potential to Achieve Goals: Fair Progress towards PT goals: Progressing toward goals    Frequency    Min 2X/week      PT Plan Current plan remains appropriate    Co-evaluation             End of Session   Activity Tolerance: Patient limited by pain Patient left: in  chair;with call bell/phone within reach;with chair alarm set;with family/visitor present     Time: [id number] PT Time Calculation (min) (ACUTE ONLY): 31 min  Charges:  $Therapeutic Activity: 23-37 mins                    G CodesEliseo Gum:      Boruch Manuele V Otisha Spickler 08/28/2016, 4:20 PM 08/28/2016  [place] BingKen Nabeeha Badertscher, PT [phone]  (pager)

## 2016-08-28 NOTE — Care Management Note (Addendum)
Case Management Note  Patient Details  Name: [name] MRN: [id number] Date of Birth: 30-Apr-1956  Subjective/Objective:  CM following for progression and d/c planning.                   Action/Plan: 08/28/2016 Met with pt re [place] needs, as pt is refusing SNF per PT. As pt is uninsured SNF ,if we were able to arrange would be by letter of guarantee only and would not include PT at the SNF. Per pt she has a wheelchair, walker, hospital bed, tub seat and 3:1 as well as a ramp as she was previously a Hospice pt due to her cirrhosis.  Pt daughter and husband will assist with care, pt is anxious to get home before Christmas. Will ask [facility] to provide [facility] for assessment and eval in the home for progression and exercise compliance.  We are unable to get HHPT.   Expected Discharge Date:                  Expected Discharge Plan:  [place]  In-House Referral:  Clinical Social Work  Discharge planning Services  CM Consult, [facility]  Post Acute Care Choice:  Home Health Choice offered to:  NA  DME Arranged:  N/A DME Agency:  NA  HH Arranged:  RN [place] Agency:  [facility]  Status of Service:  In process, will continue to follow  If discussed at Long Length of Stay Meetings, dates discussed:    Additional Comments:  Adron Bene, RN 08/28/2016, 4:59 PM

## 2016-08-28 NOTE — Progress Notes (Signed)
Patient refused foley catheter to be inserted for acute urinary retention and says she can void she just needs some more time.

## 2016-08-28 NOTE — Plan of Care (Signed)
Problem: Safety: Goal: Ability to remain free from injury will improve Outcome: Progressing Patient remain free from falls and injury. She was educated to call for help when in need. Will continue to assess.  Problem: Pain Managment: Goal: General experience of comfort will improve Outcome: Progressing Patient has not had any complaints of pain throughout shift and appears to be comfortable. Will continue to assess pt.

## 2016-08-28 NOTE — Op Note (Signed)
NAME:  [name], [name]                    ACCOUNT NO.:  MEDICAL RECORD NO.:  [id number]  LOCATION:                                 FACILITY:  PHYSICIAN:  Burnard BuntingG. Scott Dean, M.D.    DATE OF BIRTH:  Sep 02, 1956  DATE OF PROCEDURE: DATE OF DISCHARGE:                              OPERATIVE REPORT   PREOPERATIVE DIAGNOSIS:  Left hip intertrochanteric fracture.  POSTOPERATIVE DIAGNOSIS:  Left hip intertrochanteric fracture.  PROCEDURE:  Left hip intertrochanteric fracture fixation using Smith and Nephew INTERTAN hand screw 10 x 36 with 90 mm lag screw and 85 mm compression screws.  SURGEON:  Burnard BuntingG. Scott Dean, MD  ASSISTANT:  None.  ANESTHESIA:  General.  INDICATIONS:  [name] is 60 year old patient with left hip fracture presents for operative management after explanation of risks and benefits.  PROCEDURE IN DETAIL:  Patient was brought to operating room where general anesthetic was induced.  Preoperative antibiotics administered. Time-out was called.  The patient was placed on the fracture table with the right leg in lithotomy position.  Peroneal nerve well padded.  Left leg placed under some traction and internal rotation.  Fracture reduction was achieved.  Time-out was called.  Left hip area was prescrubbed with alcohol and Betadine and allowed to air dry, prepped with DuraPrep solution draped in sterile manner.  Collier Flowersoban was used to cover the operative field.  Under fluoroscopic guidance, a guide pin was placed through an incision 4 fingerbreadths proximal to the trochanter. Guidepin was placed in the tip of the trochanter and passed the lesser trochanter.  Excess free incision then made and Cobb elevator then placed on top of the femur and the bone was used to assist with fracture reduction which was not anatomic enough using just traction and internal external rotation.  With the bone hook in position, guide pin was repositioned and placed in optimal position.  In accordance  with preoperative templating, proximal reaming was performed and 10 x 36 nail was placed.  Compression and interference screw were then placed across the fracture site in the center-center position, slightly inferior in the better bone of the inferior calcar.  This gave excellent compression and good fracture reduction.  At this time, all 3 incisions were thoroughly irrigated.  Fluoroscopy confirmed correct placement in the AP and lateral planes.  Patient's incision was then irrigated and closed using 0 Vicryl suture, 2-0 Vicryl suture, and 3-0 nylon.  Aquacel dressing was placed.  Compression placed around that side as well with two 6-inch Ace wraps.  The patient tolerated the procedure well without immediate complication, transferred to recovery room in stable condition.     Burnard BuntingG. Scott Dean, M.D.     GSD/MEDQ  D:  08/23/2016  T:  08/24/2016  Job:  [id number]

## 2016-08-28 NOTE — Discharge Summary (Addendum)
Physician Discharge Summary  Elmon ElseDeborah C Monterroso EAV:[id number]:[id number] DOB: 01-07-1956 DOA: 08/21/2016  PCP: Vertis KelchALLEN, DEBRA, NP  Admit date: 08/21/2016 Discharge date: 08/28/2016  Time spent: 45 minutes  Recommendations for Outpatient Follow-up:  1. PCP in 1 week 2. Fu with Ortho Dr.Dean 3. FU with primary Gastroenterologist for 2weeks 4.         Wound care-Left Hip change dressing every other day  Discharge Diagnoses:  Active Problems:   Acute upper GI bleed   Acute blood loss anemia   Alcoholic cirrhosis   Chronic alcoholism   Coagulopathy   Thrombocytopenia   AKI   Acute blood loss anemia   Fall   Hip fracture (HCC)   Elevated troponin   Aspiration pneumonia  Discharge Condition: stable  Diet recommendation: heart healthy  Filed Weights   08/26/16 2312 08/27/16 0500 08/27/16 2239  Weight: 65.8 kg (145 lb) 65.8 kg (145 lb 1 oz) 69.4 kg (153 lb)    History of present illness:  60/F with Alcoholism and tobacco abuse, She presented to [facility] 12/11 after her son found her in her home, lying in her bed covered in feces. She apparently had a mechanical fall 3 days prior. Imaging in ED revealed intertrochanteric hip fx. She also had positive FOBT and reported that she had been experiencing hematemesis as well as melena, long standing hx of significant ETOH intake (she reports "three 1/2 gallons of liquor per day), found to have Hb of 5.8  Hospital Course:  Anemia/Upper GI bleed -admitted with Hb of 5.8 in background of chronic alcoholism -s/p 3units PRBC and 2units FFP -GI consulted, EGD showed portal gastropathy, no varices and no active bleeding -now Hb stable, improved, no further hematemesis or melena -FU with GI, continue oral protonix  Left hip intertrochanteric fracture  -Ortho consulted, s/p IM Nail 12/13 -Pt consulted, SNF recommended -no DVT prophylaxis with anticoagulants due to thrombocytopenia and coagulopathy  -FU with Dr.Dean in 7-10days  Elevated  troponin -on admission, felt to be due to demand from severe anemia, no known CAD -peaked at 2 -ECHO with normal Wall motion and EF,  -seen by Cardiology this admission, started on metoprolol, no workup planned at this time, FU with cardiology  Aspiration pneumonia -noted on admission, treated with IV unasyn initially then changed to augmentin -improved -complete 1more day of augmentin   Alcoholic cirrhosis with coagulopathy/thrombocytopenia -long standing alcoholism -started on thiamine, no withdrawal noted -per daughter alcoholic for 35-40years -s/p FFP, now stable -FU with GI, counseled, was followed by Hospice for advanced liver disease in the past, now off hospice  AKi  -baseline creatinine normal -improved with hydration  Urinary retention -on admission required foley, this was removed, now able to void but incontinent    Procedures: EGD: Impression:               - Benign-appearing esophageal stenosis.                           - Portal hypertensive gastropathy.                           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                        PROCEDURE: INTRAMEDULLARY HIP SCREW left 12/13   Consultations:  Ortho Dr.Dean  Gi Dr.Nandigam  Discharge Exam: Vitals:   08/28/16 [id number] 08/28/16  1009  BP: 105/61 112/64  Pulse: (!) 59 64  Resp: 16   Temp: 97.8 F (36.6 C)     General: AAOx3 Cardiovascular: S1S2/RRR Respiratory: CTAB  Discharge Instructions    Current Discharge Medication List    START taking these medications   Details  amoxicillin-clavulanate (AUGMENTIN) 875-125 MG tablet Take 1 tablet by mouth every 12 (twelve) hours. For 1day    oxyCODONE (OXY IR/ROXICODONE) 5 MG immediate release tablet Take 1 tablet (5 mg total) by mouth every 4 (four) hours as needed for breakthrough pain ((for MODERATE breakthrough pain)). Qty: 30 tablet, Refills: 0    pantoprazole (PROTONIX) 40 MG tablet Take 1 tablet (40 mg total) by mouth daily at 6 (six)  AM.    polyethylene glycol (MIRALAX / GLYCOLAX) packet Take 17 g by mouth daily as needed. Qty: 14 each, Refills: 0    thiamine 100 MG tablet Take 1 tablet (100 mg total) by mouth daily.      CONTINUE these medications which have NOT CHANGED   Details  albuterol (PROVENTIL HFA;VENTOLIN HFA) 108 (90 BASE) MCG/ACT inhaler Inhale 1-2 puffs into the lungs every 6 (six) hours as needed for wheezing. Qty: 1 Inhaler, Refills: 0       Allergies  Allergen Reactions  . Loratadine Rash   Follow-up Information    Eula Listen, MD. Schedule an appointment as soon as possible for a visit.   Specialty:  Gastroenterology Why:  Call to make appt within 4 weeks of discharge.  Contact information: [address] [place] [address] [phone]            The results of significant diagnostics from this hospitalization (including imaging, microbiology, ancillary and laboratory) are listed below for reference.    Significant Diagnostic Studies: Dg Chest 1 View  Result Date: 08/21/2016 CLINICAL DATA:  Fall, left hip pain EXAM: CHEST 1 VIEW COMPARISON:  06/25/2015 FINDINGS: Cardiomediastinal silhouette is stable. No infiltrate or pleural effusion. No pulmonary edema. Minimal thoracic dextroscoliosis. IMPRESSION: No active disease. Electronically Signed   By: Natasha Mead M.D.   On: 08/21/2016 13:54   Ct Head Wo Contrast  Result Date: 08/21/2016 CLINICAL DATA:  Fall and altered mental status EXAM: CT HEAD WITHOUT CONTRAST CT CERVICAL SPINE WITHOUT CONTRAST TECHNIQUE: Multidetector CT imaging of the head and cervical spine was performed following the standard protocol without intravenous contrast. Multiplanar CT image reconstructions of the cervical spine were also generated. COMPARISON:  Head CT 05/25/2015 and 03/24/2010. FINDINGS: CT HEAD FINDINGS Brain: No mass lesion, intraparenchymal hemorrhage or extra-axial collection. No evidence of acute cortical infarct. There is encephalomalacia  within the left frontal lobe at the site of prior MCA infarct. There is mildly age advanced volume loss. No hydrocephalus. Vascular: No hyperdense vessel or unexpected calcification. Skull: Normal visualized skull base, calvarium and extracranial soft tissues. Sinuses/Orbits: No sinus fluid levels or advanced mucosal thickening. No mastoid effusion. Normal orbits. CT CERVICAL SPINE FINDINGS Alignment: No static subluxation. Facets are aligned. Occipital condyles are normally positioned. Reversal of the normal cervical lordosis. Minimal C5-C6 anterolisthesis slightly increased relative to 03/24/2010, possibly positional. Skull base and vertebrae: No acute fracture. Soft tissues and spinal canal: No prevertebral fluid or swelling. No visible canal hematoma. Disc levels: No advanced spinal canal or neural foraminal stenosis. Upper chest: No pneumothorax, pulmonary nodule or pleural effusion. Other: Normal visualized paraspinal cervical soft tissues. IMPRESSION: 1. No acute intracranial abnormality. 2. No acute fracture of the cervical spine. 3. 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 Electronically Signed   By: Deatra Robinson M.D.   On: 08/21/2016 14:40   Ct Cervical Spine Wo Contrast  Result Date: 08/21/2016 CLINICAL DATA:  Fall and altered mental status EXAM: CT HEAD WITHOUT CONTRAST CT CERVICAL SPINE WITHOUT CONTRAST TECHNIQUE: Multidetector CT imaging of the head and cervical spine was performed following the standard protocol without intravenous contrast. Multiplanar CT image reconstructions of the cervical spine were also generated. COMPARISON:  Head CT 05/25/2015 and 03/24/2010. FINDINGS: CT HEAD FINDINGS Brain: No mass lesion, intraparenchymal hemorrhage or extra-axial collection. No evidence of acute cortical infarct. There is encephalomalacia within the left frontal lobe at the site of prior MCA infarct. There is mildly age advanced volume loss. No hydrocephalus.  Vascular: No hyperdense vessel or unexpected calcification. Skull: Normal visualized skull base, calvarium and extracranial soft tissues. Sinuses/Orbits: No sinus fluid levels or advanced mucosal thickening. No mastoid effusion. Normal orbits. CT CERVICAL SPINE FINDINGS Alignment: No static subluxation. Facets are aligned. Occipital condyles are normally positioned. Reversal of the normal cervical lordosis. Minimal C5-C6 anterolisthesis slightly increased relative to 03/24/2010, possibly positional. Skull base and vertebrae: No acute fracture. Soft tissues and spinal canal: No prevertebral fluid or swelling. No visible canal hematoma. Disc levels: No advanced spinal canal or neural foraminal stenosis. Upper chest: No pneumothorax, pulmonary nodule or pleural effusion. Other: Normal visualized paraspinal cervical soft tissues. IMPRESSION: 1. No acute intracranial abnormality. 2. No acute fracture of the cervical spine. 3. 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 Electronically Signed   By: Deatra Robinson M.D.   On: 08/21/2016 14:40   Pelvis Portable  Result Date: 08/23/2016 CLINICAL DATA:  60 year old female status post internal fixation hip of the left femoral fracture. EXAM: PORTABLE PELVIS 1-2 VIEWS COMPARISON:  Abdominal CT dated 04/27/2016 FINDINGS: Five intraoperative fluoroscopy spot images of the left hip and 1 radiograph of the pelvis provided. There is a mildly displaced intratrochanteric fracture of the left femur. There is interval placement of and intramedullary rod and transcervical fixation screws. The orthopedic hardware appears intact. There is near complete anatomic alignment of the fracture fragments of the left femoral neck. The bones are osteopenic. There is mild osteoarthritic changes of the hips. There is postsurgical changes of the soft tissues of the left lateral pelvis with small pockets of soft tissue air. IMPRESSION: Internal fixation of left femoral  intertrochanteric fracture. The fracture fragments appear in anatomic alignment. Electronically Signed   By: Elgie Collard M.D.   On: 08/23/2016 23:53   Dg Chest Port 1 View  Result Date: 08/22/2016 CLINICAL DATA:  Shortness of breath.  Respiratory failure. EXAM: PORTABLE CHEST 1 VIEW COMPARISON:  Single-view of the chest 08/21/2016. PA and lateral chest 06/25/2015. FINDINGS: Right IJ catheter is again seen with the tip projecting near the superior cavoatrial junction. Patchy airspace opacity is seen throughout the left chest the right lung is clear. No pneumothorax or pleural effusion. Heart size is upper normal. IMPRESSION: Patchy airspace opacity throughout the left chest worrisome for pneumonia. Electronically Signed   By: Drusilla Kanner M.D.   On: 08/22/2016 07:44   Dg Chest Portable 1 View  Result Date: 08/21/2016 CLINICAL DATA:  Central line placement. EXAM: PORTABLE CHEST 1 VIEW COMPARISON:  Radiograph of same day. FINDINGS: The heart size and mediastinal contours are within normal limits. Both lungs are clear. The visualized skeletal structures are unremarkable. Interval placement of right internal jugular catheter with distal tip in expected position of  cavoatrial junction. No pneumothorax or pleural effusion is noted. IMPRESSION: Interval placement of right internal jugular catheter with distal tip in expected position of cavoatrial junction. No pneumothorax is noted. Electronically Signed   By: Lupita RaiderJames  Green Jr, M.D.   On: 08/21/2016 15:01   Dg C-arm 1-60 Min  Result Date: 08/23/2016 CLINICAL DATA:  Left femur fracture EXAM: DG C-ARM 61-120 MIN; LEFT FEMUR 2 VIEWS COMPARISON:  08/21/2016 FINDINGS: Five low resolution intraoperative spot films of the left femur are submitted. Total fluoroscopy time was 1 minutes 47 seconds. Images were obtained during intramedullary rod fixation of a left trochanteric fracture. There is anatomic alignment. IMPRESSION: Intraoperative fluoroscopic  assistance provided during intramedullary rod fixation of proximal left femur fracture Electronically Signed   By: Jasmine PangKim  Fujinaga M.D.   On: 08/23/2016 23:47   Dg Hip Unilat W Or Wo Pelvis 2-3 Views Left  Result Date: 08/21/2016 CLINICAL DATA:  Left hip pain since a fall 08/18/2016. Patient found down 3 days later. EXAM: DG HIP (WITH OR WITHOUT PELVIS) 2-3V LEFT COMPARISON:  None. FINDINGS: Acute left intertrochanteric fracture is identified. Left femoral head is located. No other acute abnormality. IMPRESSION: Acute left intertrochanteric fracture. Electronically Signed   By: Drusilla Kannerhomas  Dalessio M.D.   On: 08/21/2016 13:54   Dg Femur Min 2 Views Left  Result Date: 08/23/2016 CLINICAL DATA:  Left femur fracture EXAM: DG C-ARM 61-120 MIN; LEFT FEMUR 2 VIEWS COMPARISON:  08/21/2016 FINDINGS: Five low resolution intraoperative spot films of the left femur are submitted. Total fluoroscopy time was 1 minutes 47 seconds. Images were obtained during intramedullary rod fixation of a left trochanteric fracture. There is anatomic alignment. IMPRESSION: Intraoperative fluoroscopic assistance provided during intramedullary rod fixation of proximal left femur fracture Electronically Signed   By: Jasmine PangKim  Fujinaga M.D.   On: 08/23/2016 23:47    Microbiology: Recent Results (from the past 240 hour(s))  Urine culture     Status: None   Collection Time: 08/21/16 12:39 PM  Result Value Ref Range Status   Specimen Description URINE, RANDOM  Final   Special Requests NONE  Final   Culture [facility] GR[facility]TH [facility]   Final   Report Status 08/23/2016 FINAL  Final  Blood culture (routine x 2)     Status: None   Collection Time: 08/21/16 12:49 PM  Result Value Ref Range Status   Specimen Description BLOOD RIGHT FOREARM  Final   Special Requests BOTTLES DRAWN AEROBIC AND ANAEROBIC [facility] EACH  Final   Culture NO GROWTH 5 DAYS  Final   Report Status 08/26/2016 FINAL  Final  Blood culture (routine x 2)      Status: None   Collection Time: 08/21/16 12:55 PM  Result Value Ref Range Status   Specimen Description LEFT ANTECUBITAL  Final   Special Requests BOTTLES DRAWN AEROBIC AND ANAEROBIC [facility] EACH  Final   Culture NO GROWTH 5 DAYS  Final   Report Status 08/26/2016 FINAL  Final  Culture, group A strep     Status: None   Collection Time: 08/21/16  6:55 PM  Result Value Ref Range Status   Specimen Description THROAT  Final   Special Requests NONE  Final   Culture NO GROUP A STREP (S.PYOGENES) ISOLATED  Final   Report Status 08/24/2016 FINAL  Final     Labs: Basic Metabolic Panel:  Recent Labs Lab 08/22/16 0100 08/22/16 0640  08/23/16 0200 08/23/16 0330 08/24/16 0200 08/25/16 0427 08/26/16 0520 08/28/16 0535  NA 135  137  < > 139  --  140 142 142 137  K 3.1* 3.8  < > 3.1*  --  2.8* 3.3* 3.1* 3.2*  CL 102 104  < > 107  --  102 103 103 100*  CO2 25 23  < > 25  --  29 33* 29 28  GLUCOSE 104* 102*  < > 151*  --  146* 118* 104* 111*  BUN 14 14  < > 24*  --  26* 23* 15 12  CREATININE 1.84* 1.70*  < > 1.48*  --  1.17* 0.95 0.82 0.76  CALCIUM 6.9* 6.8*  < > 7.0*  --  7.0* 7.2* 7.2* 7.0*  MG 1.4* 2.1  --   --  2.1 1.6* 2.1 1.9  --   PHOS 2.4*  --   --   --  <1.0* 1.6* 1.3* 1.6*  --   < > = values in this interval not displayed. Liver Function Tests:  Recent Labs Lab 08/21/16 1249 08/22/16 0405 08/23/16 0330 08/24/16 0200  AST 221* 205* 182* 145*  ALT 54 42 45 45  ALKPHOS 92 61 76 82  BILITOT 8.1* 6.9* 5.3* 6.5*  PROT 6.7 5.9* 6.3* 6.4*  ALBUMIN 2.1* 1.8* 1.8* 1.9*    Recent Labs Lab 08/21/16 1249  LIPASE 56*    Recent Labs Lab 08/21/16 1249 08/22/16 0405  AMMONIA 64* 50*   CBC:  Recent Labs Lab 08/21/16 1249  08/23/16 0330 08/24/16 0200 08/25/16 0427 08/26/16 0520 08/27/16 0355  WBC 16.2*  < > 7.8 5.6 7.9 8.9 10.0  NEUTROABS 13.6*  --  5.6 4.2 5.2 5.2  --   HGB 6.3*  < > 7.5* 9.6* 8.2* 8.4* 8.4*  HCT 21.1*  < > 23.3* 28.9* 25.0* 26.1* 26.1*  MCV 133.5*   < > 105.0* 97.0 96.5 101.2* 101.6*  PLT 86*  < > 89* 54* 51* 49* 62*  < > = values in this interval not displayed. Cardiac Enzymes:  Recent Labs Lab 08/21/16 1256 08/21/16 1302 08/21/16 2130 08/22/16 0405 08/23/16 0330  CKTOTAL  --  425*  --   --   --   TROPONINI 1.60*  --  1.66* 2.10* 2.19*   BNP: BNP (last 3 results) No results for input(s): BNP in the last 8760 hours.  ProBNP (last 3 results) No results for input(s): PROBNP in the last 8760 hours.  CBG:  Recent Labs Lab 08/21/16 1750 08/21/16 2031  GLUCAP 132* 141*       SignedZannie Cove MD.  [facility] 08/28/2016, 12:07 PM

## 2016-08-29 LAB — PROTIME-INR
INR: 1.6
Prothrombin Time: 19.3 seconds — ABNORMAL HIGH (ref 11.4–15.2)

## 2016-08-29 NOTE — Progress Notes (Signed)
Pt seen and examined, daughter in the room states that pt is alone all day at home and will be absolutely unable to care for self given Hip fracture/surgery et al now. Pt was reluctant to go home and refusing SNF 12/18pm Stable and no changes from DC summary 12/18 CSW and CM consulted for disposition assistance  Zannie CovePreetha Aldyn Toon, MD

## 2016-08-29 NOTE — Progress Notes (Signed)
Left leg incision ok Needs mepilex on incision before dc with change dressing qod

## 2016-08-30 LAB — PROTIME-INR
INR: 1.58
Prothrombin Time: 19 seconds — ABNORMAL HIGH (ref 11.4–15.2)

## 2016-08-30 NOTE — Progress Notes (Signed)
[facility] PROGRESS NOTE  Patient: [name] WGN:[id number]   PCP: Vertis KelchALLEN, DEBRA, NP DOB: 06-01-1956   DOA: 08/21/2016   DOS: 08/30/2016    Subjective: Complains about fatigue and tiredness. Also has some congestion.  Objective:  Vitals:   08/30/16 0948 08/30/16 1719  BP: (!) 97/53 (!) 92/50  Pulse: (!) 59 63  Resp: 16 17  Temp: 98.2 F (36.8 C) 98.5 F (36.9 C)    Bilateral basal crackles.  Assessment and plan: Aspiration pneumonia. Currently on Augmentin. Recent left hip fracture, S/P I am nail 08/23/2016. Anemia with upper GI bleed, H&H relatively stable. Disposition, with concerns regarding patient's inability to take care of herself at home patient will be transitioned and transferred to SNF.  Author: Lynden OxfordPranav Valin Massie, MD [facility] Pager: [phone] 08/30/2016 6:43 PM   If 7PM-7AM, please contact night-coverage at [email or web address], password [email or web address]

## 2016-08-30 NOTE — Progress Notes (Signed)
Pt refused cpm machine.

## 2016-08-30 NOTE — Clinical Social Work Note (Signed)
CSW talked with patient and daughter Carolee RotaLisa Poindexter regarding SNF placement on 12/19 (full assessment to follow). Patient and daughter informed that placement may not not be in this area as patient has no insurance. Patient/daughter advised that they will be kept informed regarding facility search.  Genelle BalVanessa Brennyn Haisley, MSW, LCSW Licensed Clinical Social Worker Clinical Social Work Department [facility] [phone]

## 2016-08-31 LAB — PROTIME-INR
INR: 1.59
PROTHROMBIN TIME: 19.1 s — AB (ref 11.4–15.2)

## 2016-08-31 MED ORDER — METHOCARBAMOL 500 MG PO TABS
500.0000 mg | ORAL_TABLET | Freq: Four times a day (QID) | ORAL | 0 refills | Status: AC | PRN
Start: 2016-08-31 — End: ?

## 2016-08-31 MED ORDER — MENTHOL 3 MG MT LOZG: 1.0000 | LOZENGE | OROMUCOSAL | 0 refills | Status: AC | PRN

## 2016-08-31 MED ORDER — FOLIC ACID 1 MG PO TABS: 1.0000 mg | ORAL_TABLET | Freq: Every day | ORAL | 0 refills | Status: AC

## 2016-08-31 MED ORDER — ONDANSETRON HCL 4 MG PO TABS: 4.0000 mg | ORAL_TABLET | Freq: Four times a day (QID) | ORAL | 0 refills | Status: AC | PRN

## 2016-08-31 NOTE — Clinical Social Work Placement (Deleted)
   CLINICAL SOCIAL WORK PLACEMENT  NOTE 08/31/16 - DISCHARGED TO [facility]  Date:  08/31/2016  Patient Details  Name: [name] MRN: [id number] Date of Birth: October 27, 1955  Clinical Social Work is seeking post-discharge placement for this patient at the Skilled  Nursing Facility level of care (*CSW will initial, date and re-position this form in  chart as items are completed):  No   Patient/family provided with [facility] Clinical Social Work Department's list of facilities offering this level of care within the geographic area requested by the patient (or if unable, by the patient's family).  Yes (Patient has no insurance and was advised that CSW would have to locate facility for her rehab)   Patient/family informed of their freedom to choose among providers that offer the needed level of care, that participate in Medicare, Medicaid or managed care program needed by the patient, have an available bed and are willing to accept the patient.      Patient/family informed of [facility]'s ownership interest in [facility] and [facility], as well as of the fact that they are under no obligation to receive care at these facilities.  PASRR submitted to EDS on 08/24/16     PASRR number received on 08/24/16     Existing PASRR number confirmed on       FL2 transmitted to all facilities in geographic area requested by pt/family on 08/24/16     FL2 transmitted to all facilities within larger geographic area on       Patient informed that his/her managed care company has contracts with or will negotiate with certain facilities, including the following:        Yes   Patient/family informed of bed offers received.  Patient [facility]     Physician recommends and patient chooses bed at      Patient to [facility] on 08/31/16.  Patient to be transferred to facility by Ambulance     Patient family notified  on 08/31/16 of transfer.  Name of family member notified:  Daughter Carolee RotaLisa Poindexter at the bedside     PHYSICIAN Please sign FL2     Additional Comment:    _______________________________________________ Cristobal Goldmannrawford, Tynetta Bachmann Bradley, LCSW 08/31/2016, 6:57 PM

## 2016-08-31 NOTE — Discharge Summary (Signed)
[facility] Discharge Summary   Patient: [name] ZOX:[id number]   PCP: Vertis KelchALLEN, DEBRA, NP DOB: 02/04/1956   Date of admission: 08/21/2016   Date of discharge:  08/31/2016    Discharge Diagnoses:  Active Problems:   GI bleed   Acute upper GI bleed   Chronic liver failure with hepatic coma (HCC)   Acute blood loss anemia   Fall   Hip fracture [facility](HCC)   Admitted From: HOME Disposition:  SNF  Recommendations for Outpatient Follow-up:  1. Follow-up with PCP in one week with CBC 2. Follow-up with orthopedics Dr. August Saucerean as recommended 3. Follow-up with gastroenterology in 2 weeks. 4. Wound Care left hip dressing change every other day.   Follow-up Information    Schedule an appointment as soon as possible for a visit with Eula Listenobert Rourk, MD.   Specialty:  Gastroenterology Why:  Appointment Date: on 09/19/2015 11:30a  Contact information: [address] [place] [address] [phone]        Burnard BuntingG Scott Dean, MD. Schedule an appointment as soon as possible for a visit in 10 days.   Specialty:  Orthopedic Surgery Why:  Appointmebt Date: The office will call you at phone number [phone] to schedule a follow up appt. Thank you.  Contact information: [address] [place] [address] [phone]        Vertis KelchALLEN, DEBRA, NP. Schedule an appointment as soon as possible for a visit in 1 week.   Specialty:  Nurse Practitioner Why:  Please call office for appointment verification after discharge. Thank you.          Diet recommendation: Mechanical soft diet cardiac  Activity: The patient is advised to gradually reintroduce usual activities.  Discharge Condition: good  Code Status: Full code  History of present illness: As per the H and P dictated on admission, "[name] is a 60 y.o. female with PMH as outlined below. She presented to [facility] ED 12/11 after her son found her in her home, lying in her bed covered in feces.  She apparently had a  mechanical fall 3 days prior and had been lying in bed since that time.   Imaging in ED revealed intertrochanteric hip fx.  She also had positive FOBT and reported that she had been experiencing hematemesis as well as melena.  She states hematemesis would occur several times per day and has been going on for a month or so.  Denies any hematochezia.  She has long standing hx of significant ETOH intake (she reports "three 1/2 gallons of liquor per day), but she states that she quit drinking a few weeks ago.  Due to GI bleed and hip fx, [facility].  She had endoscopy in Nov 2016 by Dr. 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 "  Hospital Course:   Summary of her active problems in the hospital is as following. Anemia/Upper GI bleed -admitted with Hb of 5.8 in background of chronic alcoholism -s/p 3units PRBC and 2units FFP -GI consulted, EGD showed portal gastropathy, no varices and no active bleeding -now Hb stable, improved, no further hematemesis or melena -FU with GI, continue oral protonix  Left hip intertrochanteric fracture  -Ortho consulted, s/p IM Nail 12/13 -Pt consulted, SNF recommended -no DVT prophylaxis with anticoagulants due to thrombocytopenia and coagulopathy  -FU with Dr.Dean in 7-10days  Elevated troponin -on admission, felt to be due to demand  from severe anemia, no known CAD -peaked at 2 -ECHO with normal Wall motion and EF,  -seen by Cardiology this admission, started on metoprolol, no workup planned at this time, FU with cardiology  Aspiration pneumonia -noted on admission, treated with IVunasyn initially then changed to augmentin -improved -complete course of augmentin   Alcoholic cirrhosis with coagulopathy/thrombocytopenia -long standing alcoholism -started on thiamine, no withdrawal noted -per daughter alcoholic for 35-40years -s/p  FFP, now stable -FU with GI, counseled, was followed by Hospice for advanced liver disease in the past, now off hospice  AKi  -baseline creatinine normal -improved with hydration  Urinary retention -on admission required foley, this was removed, now able to void but incontinent  All other chronic medical condition were stable during the hospitalization.  Patient was seen by physical therapy, who recommended SNF, which was arranged by Child psychotherapist and case Production designer, theatre/television/film. On the day of the discharge the patient's vitals were stable, and no other acute medical condition were reported by patient. the patient was felt safe to be discharge at SNF with therapy.  Procedures and Results: EGD:  Impression: - Benign-appearing esophageal stenosis. - Portal hypertensive gastropathy. 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   INTRAMEDULLARY HIP SCREW left 12/13   Consultations:  Orthopedics Dr. August Saucer  Gastroenterology Dr. Lavon Paganini  DISCHARGE MEDICATION: Current Discharge Medication List    START taking these medications   Details  folic acid (FOLVITE) 1 MG tablet Take 1 tablet (1 mg total) by mouth daily. Qty: 60 tablet, Refills: 0    menthol-cetylpyridinium (CEPACOL) 3 MG lozenge Take 1 lozenge (3 mg total) by mouth as needed for sore throat (sore throat). Qty: 100 tablet, Refills: 0    methocarbamol (ROBAXIN) 500 MG tablet Take 1 tablet (500 mg total) by mouth every 6 (six) hours as needed for muscle spasms. Qty: 15 tablet, Refills: 0    ondansetron (ZOFRAN) 4 MG tablet Take 1 tablet (4 mg total) by mouth every 6 (six) hours as needed for nausea. Qty: 20 tablet, Refills: 0    oxyCODONE (OXY IR/ROXICODONE) 5 MG immediate release tablet Take 1 tablet (5 mg total) by mouth every 4 (four) hours as needed for breakthrough pain ((for MODERATE breakthrough pain)). Qty: 30 tablet, Refills: 0    pantoprazole (PROTONIX) 40 MG tablet Take 1 tablet  (40 mg total) by mouth daily at 6 (six) AM.    polyethylene glycol (MIRALAX / GLYCOLAX) packet Take 17 g by mouth daily as needed. Qty: 14 each, Refills: 0    thiamine 100 MG tablet Take 1 tablet (100 mg total) by mouth daily.      CONTINUE these medications which have NOT CHANGED   Details  albuterol (PROVENTIL HFA;VENTOLIN HFA) 108 (90 BASE) MCG/ACT inhaler Inhale 1-2 puffs into the lungs every 6 (six) hours as needed for wheezing. Qty: 1 Inhaler, Refills: 0       Allergies  Allergen Reactions  . Loratadine Rash    Discharge Exam: Filed Weights   08/28/16 2220 08/29/16 2059 08/30/16 2224  Weight: 66.8 kg (147 lb 3.2 oz) 66.9 kg (147 lb 6.4 oz) 64.5 kg (142 lb 1.6 oz)   Vitals:   08/31/16 0542 08/31/16 1000  BP: (!) 100/54 (!) 109/55  Pulse: 74 80  Resp: 16 17  Temp: 98.5 F (36.9 C) 98.2 F (36.8 C)   General: Appear in no distress, no Rash; Oral Mucosa moist. Cardiovascular: S1 and S2 Present, no Murmur, no JVD Respiratory: Bilateral Air entry present and Clear to Auscultation,  no Crackles, no wheezes Abdomen: Bowel Sound present, Soft and no tenderness Extremities: no Pedal edema, no calf tenderness Neurology: Grossly no focal neuro deficit.  The results of significant diagnostics from this hospitalization (including imaging, microbiology, ancillary and laboratory) are listed below for reference.    Significant Diagnostic Studies: Dg Chest 1 View  Result Date: 08/21/2016 CLINICAL DATA:  Fall, left hip pain EXAM: CHEST 1 VIEW COMPARISON:  06/25/2015 FINDINGS: Cardiomediastinal silhouette is stable. No infiltrate or pleural effusion. No pulmonary edema. Minimal thoracic dextroscoliosis. IMPRESSION: No active disease. Electronically Signed   By: Natasha MeadLiviu  Pop M.D.   On: 08/21/2016 13:54   Ct Head Wo Contrast  Result Date: 08/21/2016 CLINICAL DATA:  Fall and altered mental status EXAM: CT HEAD WITHOUT CONTRAST CT CERVICAL SPINE WITHOUT CONTRAST TECHNIQUE:  Multidetector CT imaging of the head and cervical spine was performed following the standard protocol without intravenous contrast. Multiplanar CT image reconstructions of the cervical spine were also generated. COMPARISON:  Head CT 05/25/2015 and 03/24/2010. FINDINGS: CT HEAD FINDINGS Brain: No mass lesion, intraparenchymal hemorrhage or extra-axial collection. No evidence of acute cortical infarct. There is encephalomalacia within the left frontal lobe at the site of prior MCA infarct. There is mildly age advanced volume loss. No hydrocephalus. Vascular: No hyperdense vessel or unexpected calcification. Skull: Normal visualized skull base, calvarium and extracranial soft tissues. Sinuses/Orbits: No sinus fluid levels or advanced mucosal thickening. No mastoid effusion. Normal orbits. CT CERVICAL SPINE FINDINGS Alignment: No static subluxation. Facets are aligned. Occipital condyles are normally positioned. Reversal of the normal cervical lordosis. Minimal C5-C6 anterolisthesis slightly increased relative to 03/24/2010, possibly positional. Skull base and vertebrae: No acute fracture. Soft tissues and spinal canal: No prevertebral fluid or swelling. No visible canal hematoma. Disc levels: No advanced spinal canal or neural foraminal stenosis. Upper chest: No pneumothorax, pulmonary nodule or pleural effusion. Other: Normal visualized paraspinal cervical soft tissues. IMPRESSION: 1. No acute intracranial abnormality. 2. No acute fracture of the cervical spine. 3. 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 Electronically Signed   By: Deatra RobinsonKevin  Herman M.D.   On: 08/21/2016 14:40   Ct Cervical Spine Wo Contrast  Result Date: 08/21/2016 CLINICAL DATA:  Fall and altered mental status EXAM: CT HEAD WITHOUT CONTRAST CT CERVICAL SPINE WITHOUT CONTRAST TECHNIQUE: Multidetector CT imaging of the head and cervical spine was performed following the standard protocol without intravenous  contrast. Multiplanar CT image reconstructions of the cervical spine were also generated. COMPARISON:  Head CT 05/25/2015 and 03/24/2010. FINDINGS: CT HEAD FINDINGS Brain: No mass lesion, intraparenchymal hemorrhage or extra-axial collection. No evidence of acute cortical infarct. There is encephalomalacia within the left frontal lobe at the site of prior MCA infarct. There is mildly age advanced volume loss. No hydrocephalus. Vascular: No hyperdense vessel or unexpected calcification. Skull: Normal visualized skull base, calvarium and extracranial soft tissues. Sinuses/Orbits: No sinus fluid levels or advanced mucosal thickening. No mastoid effusion. Normal orbits. CT CERVICAL SPINE FINDINGS Alignment: No static subluxation. Facets are aligned. Occipital condyles are normally positioned. Reversal of the normal cervical lordosis. Minimal C5-C6 anterolisthesis slightly increased relative to 03/24/2010, possibly positional. Skull base and vertebrae: No acute fracture. Soft tissues and spinal canal: No prevertebral fluid or swelling. No visible canal hematoma. Disc levels: No advanced spinal canal or neural foraminal stenosis. Upper chest: No pneumothorax, pulmonary nodule or pleural effusion. Other: Normal visualized paraspinal cervical soft tissues. IMPRESSION: 1. No acute intracranial abnormality. 2. No acute fracture of the cervical spine.  3. 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 Electronically Signed   By: Deatra Robinson M.D.   On: 08/21/2016 14:40   Pelvis Portable  Result Date: 08/23/2016 CLINICAL DATA:  60 year old female status post internal fixation hip of the left femoral fracture. EXAM: PORTABLE PELVIS 1-2 VIEWS COMPARISON:  Abdominal CT dated 04/27/2016 FINDINGS: Five intraoperative fluoroscopy spot images of the left hip and 1 radiograph of the pelvis provided. There is a mildly displaced intratrochanteric fracture of the left femur. There is interval placement of  and intramedullary rod and transcervical fixation screws. The orthopedic hardware appears intact. There is near complete anatomic alignment of the fracture fragments of the left femoral neck. The bones are osteopenic. There is mild osteoarthritic changes of the hips. There is postsurgical changes of the soft tissues of the left lateral pelvis with small pockets of soft tissue air. IMPRESSION: Internal fixation of left femoral intertrochanteric fracture. The fracture fragments appear in anatomic alignment. Electronically Signed   By: Elgie Collard M.D.   On: 08/23/2016 23:53   Dg Chest Port 1 View  Result Date: 08/22/2016 CLINICAL DATA:  Shortness of breath.  Respiratory failure. EXAM: PORTABLE CHEST 1 VIEW COMPARISON:  Single-view of the chest 08/21/2016. PA and lateral chest 06/25/2015. FINDINGS: Right IJ catheter is again seen with the tip projecting near the superior cavoatrial junction. Patchy airspace opacity is seen throughout the left chest the right lung is clear. No pneumothorax or pleural effusion. Heart size is upper normal. IMPRESSION: Patchy airspace opacity throughout the left chest worrisome for pneumonia. Electronically Signed   By: Drusilla Kanner M.D.   On: 08/22/2016 07:44   Dg Chest Portable 1 View  Result Date: 08/21/2016 CLINICAL DATA:  Central line placement. EXAM: PORTABLE CHEST 1 VIEW COMPARISON:  Radiograph of same day. FINDINGS: The heart size and mediastinal contours are within normal limits. Both lungs are clear. The visualized skeletal structures are unremarkable. Interval placement of right internal jugular catheter with distal tip in expected position of cavoatrial junction. No pneumothorax or pleural effusion is noted. IMPRESSION: Interval placement of right internal jugular catheter with distal tip in expected position of cavoatrial junction. No pneumothorax is noted. Electronically Signed   By: Lupita Raider, M.D.   On: 08/21/2016 15:01   Dg C-arm 1-60  Min  Result Date: 08/23/2016 CLINICAL DATA:  Left femur fracture EXAM: DG C-ARM 61-120 MIN; LEFT FEMUR 2 VIEWS COMPARISON:  08/21/2016 FINDINGS: Five low resolution intraoperative spot films of the left femur are submitted. Total fluoroscopy time was 1 minutes 47 seconds. Images were obtained during intramedullary rod fixation of a left trochanteric fracture. There is anatomic alignment. IMPRESSION: Intraoperative fluoroscopic assistance provided during intramedullary rod fixation of proximal left femur fracture Electronically Signed   By: Jasmine Pang M.D.   On: 08/23/2016 23:47   Dg Hip Unilat W Or Wo Pelvis 2-3 Views Left  Result Date: 08/21/2016 CLINICAL DATA:  Left hip pain since a fall 08/18/2016. Patient found down 3 days later. EXAM: DG HIP (WITH OR WITHOUT PELVIS) 2-3V LEFT COMPARISON:  None. FINDINGS: Acute left intertrochanteric fracture is identified. Left femoral head is located. No other acute abnormality. IMPRESSION: Acute left intertrochanteric fracture. Electronically Signed   By: Drusilla Kanner M.D.   On: 08/21/2016 13:54   Dg Femur Min 2 Views Left  Result Date: 08/23/2016 CLINICAL DATA:  Left femur fracture EXAM: DG C-ARM 61-120 MIN; LEFT FEMUR 2 VIEWS COMPARISON:  08/21/2016 FINDINGS: Five low resolution intraoperative  spot films of the left femur are submitted. Total fluoroscopy time was 1 minutes 47 seconds. Images were obtained during intramedullary rod fixation of a left trochanteric fracture. There is anatomic alignment. IMPRESSION: Intraoperative fluoroscopic assistance provided during intramedullary rod fixation of proximal left femur fracture Electronically Signed   By: Jasmine Pang M.D.   On: 08/23/2016 23:47    Microbiology: Recent Results (from the past 240 hour(s))  Culture, group A strep     Status: None   Collection Time: 08/21/16  6:55 PM  Result Value Ref Range Status   Specimen Description THROAT  Final   Special Requests NONE  Final   Culture NO GROUP  A STREP (S.PYOGENES) ISOLATED  Final   Report Status 08/24/2016 FINAL  Final     Labs: CBC:  Recent Labs Lab 08/25/16 0427 08/26/16 0520 08/27/16 0355  WBC 7.9 8.9 10.0  NEUTROABS 5.2 5.2  --   HGB 8.2* 8.4* 8.4*  HCT 25.0* 26.1* 26.1*  MCV 96.5 101.2* 101.6*  PLT 51* 49* 62*   Basic Metabolic Panel:  Recent Labs Lab 08/25/16 0427 08/26/16 0520 08/28/16 0535  NA 142 142 137  K 3.3* 3.1* 3.2*  CL 103 103 100*  CO2 33* 29 28  GLUCOSE 118* 104* 111*  BUN 23* 15 12  CREATININE 0.95 0.82 0.76  CALCIUM 7.2* 7.2* 7.0*  MG 2.1 1.9  --   PHOS 1.3* 1.6*  --    Time spent: 30 minutes  Signed:  Khaliq Turay  [facility]  08/31/2016  , 5:13 PM

## 2016-08-31 NOTE — Progress Notes (Signed)
Physical Therapy Treatment Patient Details Name: Debbie ElseDeborah C Valentine MRN: [id number] DOB: 05-13-1956 Today's Date: 08/31/2016    History of Present Illness Pt is a 60 y/o female who presented to [facility] ED 12/11 after her son found her in her home, lying in her bed covered in feces.  She apparently had a mechanical fall 3 days prior and had been lying in bed since that time. [facility] Underwent ORIF for L hip fx 12/13. Pt with significant PMH of ETOH abuse; alcoholic cirrhosis, alcohol dementia, chronic GI bleeding.     PT Comments    Pt making slow progress with mobility.  She is self-limiting and when encouraged to mobilize, does only a minimal amount to satisfy what is required.  She may be having trouble mentally following direction, but pt not giving all she can do.   Follow Up Recommendations  SNF;Supervision/Assistance - 24 hour     Equipment Recommendations  None recommended by PT;Other (comment)    Recommendations for Other Services       Precautions / Restrictions Precautions Precautions: Fall Restrictions LLE Weight Bearing: Non weight bearing    Mobility  Bed Mobility Overal bed mobility: Needs Assistance Bed Mobility: Supine to Sit;Sit to Supine     Supine to sit: Mod assist;+2 for safety/equipment Sit to supine: Mod assist;+2 for safety/equipment   General bed mobility comments: pt assisted minimally with bridging and then truncal assist to come up.  Transfers Overall transfer level: Needs assistance Equipment used: Rolling walker (2 wheeled) Transfers: Sit to/from [employer]Stand;Stand Pivot Transfers Sit to Stand: Max assist;+2 safety/equipment Stand pivot transfers: Max assist;+2 safety/equipment       General transfer comment: pt not able physically or mentally to keep her weight off L LE during pivot.  Ambulation/Gait             General Gait Details: pt deferred   Stairs            Wheelchair Mobility    Modified Rankin (Stroke  Patients Only)       Balance Overall balance assessment: Needs assistance Sitting-balance support: No upper extremity supported;Single extremity supported Sitting balance-Leahy Scale: Fair Sitting balance - Comments: can not accept challenge   Standing balance support: Bilateral upper extremity supported Standing balance-Leahy Scale: Poor Standing balance comment: reliant on RW and support and still can not lift her L foot off the ground.                    Cognition Arousal/Alertness: Awake/alert Behavior During Therapy: Flat affect Overall Cognitive Status: History of cognitive impairments - at baseline                      Exercises Other Exercises Other Exercises: warm up hip/knee flexion /extension    General Comments        Pertinent Vitals/Pain Pain Assessment: Faces Faces Pain Scale: Hurts even more Pain Location: L upper femur Pain Descriptors / Indicators: Aching;Grimacing;Moaning Pain Intervention(s): Premedicated before session;Monitored during session;Limited activity within patient's tolerance    Home Living                      Prior Function            PT Goals (current goals can now be found in the care plan section) Acute Rehab PT Goals Patient Stated Goal: to get better PT Goal Formulation: With patient/family Time For Goal Achievement: 09/08/16 Potential to Achieve Goals: Fair Progress  towards PT goals: Progressing toward goals    Frequency    Min 2X/week      PT Plan Current plan remains appropriate    Co-evaluation             End of Session   Activity Tolerance: Patient limited by pain;Other (comment) (and due to self-limiting behaviors.) Patient left: in bed;with call bell/phone within reach;with bed alarm set;with family/visitor present     Time: [id number] PT Time Calculation (min) (ACUTE ONLY): 25 min  Charges:  $Therapeutic Activity: 23-37 mins                    G CodesEliseo Valentine:      [name]  [name] 08/31/2016, 1:32 PM  08/31/2016  [place] BingKen Shaelyn Valentine, PT [phone]  (pager)

## 2016-08-31 NOTE — [place] FL2 (Signed)
[place] MED[place] FL2 LEVEL OF CARE SCREENING TOOL     IDENTIFICATION  Patient Name: [name] Birthdate: June 09, 1956 Sex: female Admission Date (Current Location): 08/21/2016  [facility] [place]   Facility and Address:  [facility], [address], [place], [place] [address]      Provider Number: [id number]  Attending Physician Name and Address:  Rolly SalterPranav M Patel, MD  Relative Name and Phone Number:  Poindexter,Lisa-Daughter; [phone] (home) [phone] (work), [phone] (mobile)     Current Level of Care: Hospital Recommended Level of Care: Skilled Nursing Facility Prior Approval Number:    Date Approved/Denied:   PASRR Number: [id number] [id number]  Discharge Plan: SNF    Current Diagnoses: Patient Active Problem List   Diagnosis Date Noted  . Hip fracture (HCC) 08/24/2016  . Fall   . Acute blood loss anemia   . GI bleed 08/21/2016  . Acute upper GI bleed   . Chronic liver failure with hepatic coma (HCC)   . Sepsis secondary to UTI (HCC) 04/28/2016  . Tobacco use 04/27/2016  . Mucosal abnormality of stomach   . Mucosal abnormality of esophagus   . Melena 07/06/2015  . Renal lesion 07/06/2015  . Sepsis (HCC) 06/26/2015  . Anemia of chronic disease 06/26/2015  . UTI (lower urinary tract infection) 06/25/2015  . Aspiration pneumonia (HCC) 05/29/2015  . E-coli UTI 05/28/2015  . Coagulopathy (HCC) 05/28/2015  . Sepsis due to Gram negative bacteria (HCC) 05/27/2015  . Compression fracture of L1 lumbar vertebra (HCC) 05/26/2015  . Macrocytic anemia 05/25/2015  . Alcoholic cirrhosis of liver without ascites (HCC) 05/25/2015  . Acute encephalopathy 05/25/2015  . Thrombocytopenia (HCC) 05/25/2015  . Elevated troponin 05/25/2015  . ETOH abuse 05/25/2015  . Anemia 03/22/2015  . Abnormal liver function   . Shoulder impingement syndrome 07/21/2014  . Cholecystitis, acute 07/23/2013  . Trichomonas infection 07/23/2013   . Acute gastroenteritis 07/22/2013  . Alcohol abuse 07/22/2013  . Hyponatremia 07/22/2013  . Hypokalemia 07/22/2013  . Dehydration 07/22/2013  . Macrocytosis 07/22/2013  . Cholelithiasis 07/22/2013  . Renal cyst 07/22/2013    Orientation RESPIRATION BLADDER Height & Weight     Self, Time, Situation, Place  Normal Incontinent Weight: 142 lb 1.6 oz (64.5 kg) Height:  5\' 6"  (167.6 cm)  BEHAVIORAL SYMPTOMS/MOOD NEUROLOGICAL BOWEL NUTRITION STATUS      Continent Diet (DYS 1)  AMBULATORY STATUS COMMUNICATION OF NEEDS Skin   Extensive Assist Verbally Other (Comment) (Skin Tear left knee with foam dressing)                       Personal Care Assistance Level of Assistance  Bathing, Feeding, Dressing Bathing Assistance: Maximum assistance Feeding assistance: Independent (Assistance with set-up) Dressing Assistance: Maximum assistance     Functional Limitations Info  Sight, Hearing, Speech Sight Info: Adequate Hearing Info: Adequate Speech Info: Adequate    SPECIAL CARE FACTORS FREQUENCY  PT (By licensed PT), OT (By licensed OT)     PT Frequency: Evaluated 12/15 and a minimum of 2 times per week therapy recommended OT Frequency: Evaluated 12/15 and a minimum of 2 times per week therapy recommended            Contractures Contractures Info: Not present    Additional Factors Info  Code Status, Allergies Code Status Info: Full Allergies Info: Loratadine           Current Medications (08/31/2016):  This is the current hospital active  medication list Current Facility-Administered Medications  Medication Dose Route Frequency Provider Last Rate Last Dose  . acetaminophen (TYLENOL) tablet 650 mg  650 mg Oral Q6H PRN Cammy CopaScott Gregory Dean, MD   650 mg at 08/25/16 2150   Or  . acetaminophen (TYLENOL) suppository 650 mg  650 mg Rectal Q6H PRN Cammy CopaScott Gregory Dean, MD      . albuterol (PROVENTIL) (2.5 MG/3ML) 0.083% nebulizer solution 2.5 mg  2.5 mg Inhalation Q6H PRN Cammy CopaScott  Gregory Dean, MD      . amoxicillin-clavulanate (AUGMENTIN) 875-125 MG per tablet 1 tablet  1 tablet Oral Q12H Zannie CovePreetha Joseph, MD   1 tablet at 08/31/16 1155  . folic acid (FOLVITE) tablet 1 mg  1 mg Oral Daily Belinda Fisheraylor P Stone, RPH   1 mg at 08/31/16 1155  . menthol-cetylpyridinium (CEPACOL) lozenge 3 mg  1 lozenge Oral PRN Cammy CopaScott Gregory Dean, MD       Or  . phenol [facility](CHLORASEPTIC) mouth spray 1 spray  1 spray Mouth/Throat PRN Cammy CopaScott Gregory Dean, MD      . methocarbamol (ROBAXIN) tablet 500 mg  500 mg Oral Q6H PRN Cammy CopaScott Gregory Dean, MD   500 mg at 08/25/16 2151  . ondansetron (ZOFRAN) injection 4 mg  4 mg Intravenous Q6H PRN Zannie CovePreetha Joseph, MD   4 mg at 08/25/16 2151  . ondansetron (ZOFRAN) tablet 4 mg  4 mg Oral Q6H PRN Cammy CopaScott Gregory Dean, MD   4 mg at 08/24/16 0920   Or  . ondansetron [facility](ZOFRAN) injection 4 mg  4 mg Intravenous Q6H PRN Cammy CopaScott Gregory Dean, MD      . oxyCODONE (Oxy IR/ROXICODONE) immediate release tablet 5 mg  5 mg Oral Q4H PRN Cammy CopaScott Gregory Dean, MD   5 mg at 08/31/16 1130  . pantoprazole (PROTONIX) EC tablet 40 mg  40 mg Oral Q0600 Dianah FieldSarah J Gribbin, PA-C   40 mg at 08/31/16 0516  . phosphorus (K PHOS NEUTRAL) tablet 500 mg  500 mg Oral TID WC & HS Zannie CovePreetha Joseph, MD   500 mg at 08/30/16 2147  . sorbitol, milk of mag, mineral oil, glycerin (SMOG) enema  960 mL Rectal Once Zannie CovePreetha Joseph, MD      . thiamine (VITAMIN B-1) tablet 100 mg  100 mg Oral Daily Zannie CovePreetha Joseph, MD   100 mg at 08/31/16 1155     Discharge Medications: Please see discharge summary for a list of discharge medications.  Relevant Imaging Results:  Relevant Lab Results:   Additional Information ss#[id number]. Recent left hip fracture, S/P I am nail 08/23/2016.  Cristobal Goldmannrawford, Honestee Revard Bradley, LCSW

## 2016-08-31 NOTE — Progress Notes (Signed)
OT Cancellation Note  Patient Details Name: [name] MRN: [id number] DOB: [birth year]   Cancelled Treatment:    Reason Eval/Treat Not Completed: Other (comment);Fatigue/lethargy limiting ability to participate. Pt reports fatigue/nausea due to meds and not having any food on her stomach while awaiting lunch. Pt states " I'm not ding any therapy or walking right now". Will check back later as able  Galen ManilaSpencer, Katye Valek Jeanette 08/31/2016, 12:59 PM

## 2016-08-31 NOTE — Progress Notes (Signed)
Attempted to call report to [facility] [place]. Receiving nurse will not accept report due to being unaware of pt admission.

## 2016-08-31 NOTE — Progress Notes (Signed)
[facility] after facillitation by Social Worker Erie NoeVanessa. Ambulance arrived, pt ready to go.

## 2016-08-31 NOTE — Progress Notes (Signed)
[name] to be D/C'd Skilled nursing facility per MD order.  Discussed prescriptions and follow up appointments with the patient. Prescriptions given to patient, medication list explained in detail. Pt verbalized understanding.  Allergies as of 08/31/2016      Reactions   Loratadine Rash      Medication List    TAKE these medications   albuterol 108 (90 Base) MCG/ACT inhaler Commonly known as:  PROVENTIL HFA;VENTOLIN HFA Inhale 1-2 puffs into the lungs every 6 (six) hours as needed for wheezing.   folic acid 1 MG tablet Commonly known as:  FOLVITE Take 1 tablet (1 mg total) by mouth daily. Start taking on:  09/01/2016   menthol-cetylpyridinium 3 MG lozenge Commonly known as:  CEPACOL Take 1 lozenge (3 mg total) by mouth as needed for sore throat (sore throat).   methocarbamol 500 MG tablet Commonly known as:  ROBAXIN Take 1 tablet (500 mg total) by mouth every 6 (six) hours as needed for muscle spasms.   ondansetron 4 MG tablet Commonly known as:  ZOFRAN Take 1 tablet (4 mg total) by mouth every 6 (six) hours as needed for nausea.   oxyCODONE 5 MG immediate release tablet Commonly known as:  Oxy IR/ROXICODONE Take 1 tablet (5 mg total) by mouth every 4 (four) hours as needed for breakthrough pain ((for MODERATE breakthrough pain)).   pantoprazole 40 MG tablet Commonly known as:  PROTONIX Take 1 tablet (40 mg total) by mouth daily at 6 (six) AM.   polyethylene glycol packet Commonly known as:  MIRALAX / GLYCOLAX Take 17 g by mouth daily as needed.   thiamine 100 MG tablet Take 1 tablet (100 mg total) by mouth daily.       Vitals:   08/31/16 1000 08/31/16 1751  BP: (!) 109/55 105/67  Pulse: 80 73  Resp: 17 17  Temp: 98.2 F (36.8 C) 98.1 F (36.7 C)    Skin clean, dry and intact without evidence of skin break down, no evidence of skin tears noted. IV catheter discontinued intact. Site without signs and symptoms of complications. Dressing and pressure  applied. Pt denies pain at this time. No complaints noted.  An After Visit Summary was printed and given to the patient. Patient escorted via stretcher and D/C to facility  via ambulance  Mariann BarterKellie Hymen Arnett BSN, RN [facility] Phone [phone]

## 2016-08-31 NOTE — Clinical Social Work Placement (Signed)
   CLINICAL SOCIAL WORK PLACEMENT  NOTE 08/31/16 - DISCHARGED TO [facility] [place] VIA AMBULANCE  Date:  08/31/2016  Patient Details  Name: [name] MRN: [id number] Date of Birth: October 19, 1955  Clinical Social Work is seeking post-discharge placement for this patient at the Skilled  Nursing Facility level of care (*CSW will initial, date and re-position this form in  chart as items are completed):  No   Patient/family provided with [facility] Clinical Social Work Department's list of facilities offering this level of care within the geographic area requested by the patient (or if unable, by the patient's family).  Yes (Patient has no insurance and was advised that CSW would have to locate facility for her rehab)   Patient/family informed of their freedom to choose among providers that offer the needed level of care, that participate in Medicare, Medicaid or managed care program needed by the patient, have an available bed and are willing to accept the patient.      Patient/family informed of [facility]'s ownership interest in [facility] and [facility], as well as of the fact that they are under no obligation to receive care at these facilities.  PASRR submitted to EDS on 08/24/16     PASRR number received on 08/24/16     Existing PASRR number confirmed on       FL2 transmitted to all facilities in geographic area requested by pt/family on 08/24/16     FL2 transmitted to all facilities within larger geographic area on       Patient informed that his/her managed care company has contracts with or will negotiate with certain facilities, including the following:        Yes   Patient/family informed of bed offers received.  [facility] [place]     Physician recommends and patient chooses bed at      [facility] [place] on 08/31/16.  Patient to be transferred to facility by Ambulance     Patient  family notified on 08/31/16 of transfer.  Name of family member notified:  Daughter Carolee RotaLisa Poindexter at the bedside     PHYSICIAN Please sign FL2     Additional Comment:    _______________________________________________ Cristobal Goldmannrawford, Stefanny Pieri Bradley, LCSW 08/31/2016, 6:34 PM

## 2016-08-31 NOTE — Progress Notes (Signed)
Elmon Elseeborah C Sahota to be D/C'd Skilled nursing facility per MD order.  Discussed prescriptions and follow up appointments with the patient. Prescriptions given to patient, medication list explained in detail. Pt verbalized understanding.  Allergies as of 08/31/2016      Reactions   Loratadine Rash      Medication List    TAKE these medications   albuterol 108 (90 Base) MCG/ACT inhaler Commonly known as:  PROVENTIL HFA;VENTOLIN HFA Inhale 1-2 puffs into the lungs every 6 (six) hours as needed for wheezing.   folic acid 1 MG tablet Commonly known as:  FOLVITE Take 1 tablet (1 mg total) by mouth daily. Start taking on:  09/01/2016   menthol-cetylpyridinium 3 MG lozenge Commonly known as:  CEPACOL Take 1 lozenge (3 mg total) by mouth as needed for sore throat (sore throat).   methocarbamol 500 MG tablet Commonly known as:  ROBAXIN Take 1 tablet (500 mg total) by mouth every 6 (six) hours as needed for muscle spasms.   ondansetron 4 MG tablet Commonly known as:  ZOFRAN Take 1 tablet (4 mg total) by mouth every 6 (six) hours as needed for nausea.   oxyCODONE 5 MG immediate release tablet Commonly known as:  Oxy IR/ROXICODONE Take 1 tablet (5 mg total) by mouth every 4 (four) hours as needed for breakthrough pain ((for MODERATE breakthrough pain)).   pantoprazole 40 MG tablet Commonly known as:  PROTONIX Take 1 tablet (40 mg total) by mouth daily at 6 (six) AM.   polyethylene glycol packet Commonly known as:  MIRALAX / GLYCOLAX Take 17 g by mouth daily as needed.   thiamine 100 MG tablet Take 1 tablet (100 mg total) by mouth daily.       Vitals:   08/31/16 1000 08/31/16 1751  BP: (!) 109/55 105/67  Pulse: 80 73  Resp: 17 17  Temp: 98.2 F (36.8 C) 98.1 F (36.7 C)    Skin clean, dry and intact without evidence of skin break down, no evidence of skin tears noted. IV catheter discontinued intact. Site without signs and symptoms of complications. Dressing and pressure  applied. Pt denies pain at this time. No complaints noted.  An After Visit Summary was printed and given to the patient. [facility]. Mariann Barter.  Xandria Gallaga BSN, RN [facility] Phone [phone]

## 2016-09-08 ENCOUNTER — Ambulatory Visit (INDEPENDENT_AMBULATORY_CARE_PROVIDER_SITE_OTHER): Payer: Self-pay | Admitting: Orthopedic Surgery

## 2016-09-08 ENCOUNTER — Ambulatory Visit (INDEPENDENT_AMBULATORY_CARE_PROVIDER_SITE_OTHER): Payer: Self-pay

## 2016-09-08 VITALS — Ht 66.0 in | Wt 142.0 lb

## 2016-09-08 DIAGNOSIS — S72002D Fracture of unspecified part of neck of left femur, subsequent encounter for closed fracture with routine healing: Secondary | ICD-10-CM

## 2016-09-08 DIAGNOSIS — M25552 Pain in left hip: Secondary | ICD-10-CM

## 2016-09-08 NOTE — Progress Notes (Signed)
Office Visit Note   Patient: [name]           Date of Birth: Oct 17, 1955           MRN: [id number] Visit Date: 09/08/2016              Requested by: Vertis Kelchebra Allen, NP No address on file PCP: Vertis KelchALLEN, DEBRA, NP   Assessment & Plan: Visit Diagnoses:  1. Pain in left hip   2. Closed fracture of left hip with routine healing, subsequent encounter     Plan: Physical therapy weightbearing as tolerated. There is no redness no cellulitis no drainage no signs of infection.  Follow-Up Instructions: Return in about 3 weeks (around 09/29/2016). With Dr. August Saucerean  Orders:  Orders Placed This Encounter  Procedures  . XR HIP UNILAT W OR W/O PELVIS 2-3 VIEWS LEFT   No orders of the defined types were placed in this encounter.     Procedures: No procedures performed   Clinical Data: No additional findings.   Subjective: Chief Complaint  Patient presents with  . Left Hip - Routine Post Op    08/23/16 IM nail Left hip intertrochanteric fracture.     2 weeks and 2 days s/p IM nail left hip intertrochanteric fracture. [facility] The facility advised the pt that they are worried about an infection ot the surgical site and states that there is swelling and slight drainage. The pt has the chills.    Review of Systems   Objective: Vital Signs: Ht 5\' 6"  (1.676 m)   Wt 142 lb (64.4 kg)   BMI 22.92 kg/m   Physical Exam examination patient does have some swelling around the surgical incisions however there is no redness no drainage no signs of infection. Sutures harvested today.  Ortho Exam  Specialty Comments:  No specialty comments available.  Imaging: Xr Hip Unilat W Or W/o Pelvis 2-3 Views Left  Result Date: 09/08/2016 2 views of the left hip shows stable alignment of IM nailing. There are no complicating features.    PMFS History: Patient Active Problem List   Diagnosis Date Noted  . Hip fracture (HCC) 08/24/2016  . Fall   . Acute blood  loss anemia   . GI bleed 08/21/2016  . Acute upper GI bleed   . Chronic liver failure with hepatic coma (HCC)   . Sepsis secondary to UTI (HCC) 04/28/2016  . Tobacco use 04/27/2016  . Mucosal abnormality of stomach   . Mucosal abnormality of esophagus   . Melena 07/06/2015  . Renal lesion 07/06/2015  . Sepsis (HCC) 06/26/2015  . Anemia of chronic disease 06/26/2015  . UTI (lower urinary tract infection) 06/25/2015  . Aspiration pneumonia (HCC) 05/29/2015  . E-coli UTI 05/28/2015  . Coagulopathy (HCC) 05/28/2015  . Sepsis due to Gram negative bacteria (HCC) 05/27/2015  . Compression fracture of L1 lumbar vertebra (HCC) 05/26/2015  . Macrocytic anemia 05/25/2015  . Alcoholic cirrhosis of liver without ascites (HCC) 05/25/2015  . Acute encephalopathy 05/25/2015  . Thrombocytopenia (HCC) 05/25/2015  . Elevated troponin 05/25/2015  . ETOH abuse 05/25/2015  . Anemia 03/22/2015  . Abnormal liver function   . Shoulder impingement syndrome 07/21/2014  . Cholecystitis, acute 07/23/2013  . Trichomonas infection 07/23/2013  . Acute gastroenteritis 07/22/2013  . Alcohol abuse 07/22/2013  . Hyponatremia 07/22/2013  . Hypokalemia 07/22/2013  . Dehydration 07/22/2013  . Macrocytosis 07/22/2013  . Cholelithiasis 07/22/2013  . Renal cyst 07/22/2013   Past Medical History:  Diagnosis Date  . Alcohol abuse    quit 05/23/15  . Alcoholic cirrhosis (HCC)    05/2015  . Alcoholic dementia (HCC)    05/2015,??? patient reports mental status clear, possible had change related to DTs  . Anemia of chronic disease   . Cholelithiasis   . Chronic GI bleeding   . Coagulopathy (HCC)   . Compression fracture    L1, fall in 05/2015  . Compression fracture of lumbar vertebra (HCC)   . GERD (gastroesophageal reflux disease)   . Hepatic steatosis   . Hypertension   . Normal cardiac stress test 2008   myoview  . Stroke [facility](HCC) 2008   left sided weakness  . Thrombocytopenia (HCC)     Family History    Problem Relation Age of Onset  . Leukemia Mother   . Cirrhosis Paternal Uncle     etoh  . Colon cancer Neg Hx     Past Surgical History:  Procedure Laterality Date  . APPENDECTOMY    . BIOPSY N/A 07/26/2015   Procedure: BIOPSY;  Surgeon: Corbin Adeobert M Rourk, MD;  Location: [facility];  Service: Endoscopy;  Laterality: N/A;  Gastric, Esophageal  . COLONOSCOPY     ???  . ESOPHAGOGASTRODUODENOSCOPY  04/2007   RMR: tight peptic stricture status post dilation, antral erosions, superficial ulcerations, submucosal duodenal mass, gastric biopsy positive for H. pylori, duodenal biopsy benign, completed H. pylori treatment  . ESOPHAGOGASTRODUODENOSCOPY  07/2007   RMR: peptic stricture status post dilation, gastric erosions, 2 cm submucosal duodenal mass. Biopsies negative.referred for endoscopic ultrasound of submucosal duodenal mass but unclear whether this was completed.  . ESOPHAGOGASTRODUODENOSCOPY N/A 08/22/2016   Procedure: ESOPHAGOGASTRODUODENOSCOPY (EGD);  Surgeon: Napoleon FormKavitha V Nandigam, MD;  [facility];  Service: Endoscopy;  Laterality: N/A;  . ESOPHAGOGASTRODUODENOSCOPY (EGD) WITH PROPOFOL N/A 07/26/2015   RMR: Abnoramal esophagus/short distal peptic stricture-status post dilation with scope passage- status post biopsy. No varices. Hiatal hernia. Abnormal stomach-status post biopsy. duodenal mass of uncertain significance.   Marland Kitchen. FEMUR IM NAIL Left 08/23/2016   Procedure: INTRAMEDULLARY HIP SCREW left;  Surgeon: Cammy CopaScott Gregory Dean, MD;  [facility];  Service: Orthopedics;  Laterality: Left;   Social History   Occupational History  . Not on file.   Social History Main Topics  . Smoking status: Current Every Day Smoker    Packs/day: 1.50    Years: 40.00    Types: Cigarettes  . Smokeless tobacco: Never Used  . Alcohol use 12.0 oz/week    20 Shots of liquor per week     Comment: quit etoh 05/23/15. used to drink 1/2 gallon vodka in two days   . Drug use: No  . Sexual activity: Not  on file

## 2016-09-14 ENCOUNTER — Telehealth (INDEPENDENT_AMBULATORY_CARE_PROVIDER_SITE_OTHER): Payer: Self-pay | Admitting: Radiology

## 2016-09-14 NOTE — Telephone Encounter (Signed)
[name] patient, postop hip fx, you saw last week in [name]'s absence.  Patient and family are requesting a hospital bed.  [facility]?    Fax [phone], send note and demographics.

## 2016-09-14 NOTE — Telephone Encounter (Signed)
yes

## 2016-09-14 NOTE — Telephone Encounter (Signed)
Your pt

## 2016-09-15 NOTE — Telephone Encounter (Signed)
[facility]ed to [facility] along with notes/demographics

## 2016-09-18 ENCOUNTER — Ambulatory Visit: Payer: Medicaid Other | Admitting: Gastroenterology

## 2016-09-23 ENCOUNTER — Other Ambulatory Visit: Payer: Self-pay

## 2016-09-23 ENCOUNTER — Encounter (HOSPITAL_COMMUNITY): Payer: Self-pay | Admitting: Emergency Medicine

## 2016-09-23 ENCOUNTER — Encounter: Payer: Self-pay | Admitting: Internal Medicine

## 2016-09-23 ENCOUNTER — Emergency Department (HOSPITAL_COMMUNITY)
Admission: EM | Admit: 2016-09-23 | Discharge: 2016-09-23 | Disposition: A | Discharge status: Still In Hospital | Payer: Medicaid Other

## 2016-09-23 ENCOUNTER — Emergency Department (HOSPITAL_COMMUNITY): Payer: Medicaid Other

## 2016-09-23 ENCOUNTER — Inpatient Hospital Stay (HOSPITAL_COMMUNITY)
Admission: EM | Admit: 2016-09-23 | Discharge: 2016-10-12 | DRG: 871 | Disposition: E | Payer: Medicaid Other | Attending: Internal Medicine | Admitting: Internal Medicine

## 2016-09-23 DIAGNOSIS — N179 Acute kidney failure, unspecified: Secondary | ICD-10-CM | POA: Diagnosis present

## 2016-09-23 DIAGNOSIS — Z8673 Personal history of transient ischemic attack (TIA), and cerebral infarction without residual deficits: Secondary | ICD-10-CM

## 2016-09-23 DIAGNOSIS — Z515 Encounter for palliative care: Secondary | ICD-10-CM | POA: Diagnosis present

## 2016-09-23 DIAGNOSIS — Z9114 Patient's other noncompliance with medication regimen: Secondary | ICD-10-CM

## 2016-09-23 DIAGNOSIS — K7030 Alcoholic cirrhosis of liver without ascites: Secondary | ICD-10-CM | POA: Diagnosis present

## 2016-09-23 DIAGNOSIS — K7290 Hepatic failure, unspecified without coma: Secondary | ICD-10-CM | POA: Diagnosis present

## 2016-09-23 DIAGNOSIS — E162 Hypoglycemia, unspecified: Secondary | ICD-10-CM | POA: Diagnosis present

## 2016-09-23 DIAGNOSIS — R4182 Altered mental status, unspecified: Secondary | ICD-10-CM

## 2016-09-23 DIAGNOSIS — E860 Dehydration: Secondary | ICD-10-CM | POA: Diagnosis present

## 2016-09-23 DIAGNOSIS — K767 Hepatorenal syndrome: Secondary | ICD-10-CM | POA: Diagnosis present

## 2016-09-23 DIAGNOSIS — L26 Exfoliative dermatitis: Secondary | ICD-10-CM | POA: Diagnosis present

## 2016-09-23 DIAGNOSIS — K769 Liver disease, unspecified: Secondary | ICD-10-CM

## 2016-09-23 DIAGNOSIS — K219 Gastro-esophageal reflux disease without esophagitis: Secondary | ICD-10-CM | POA: Diagnosis present

## 2016-09-23 DIAGNOSIS — F1020 Alcohol dependence, uncomplicated: Secondary | ICD-10-CM | POA: Diagnosis present

## 2016-09-23 DIAGNOSIS — E7220 Disorder of urea cycle metabolism, unspecified: Secondary | ICD-10-CM | POA: Diagnosis present

## 2016-09-23 DIAGNOSIS — I10 Essential (primary) hypertension: Secondary | ICD-10-CM | POA: Diagnosis present

## 2016-09-23 DIAGNOSIS — A419 Sepsis, unspecified organism: Principal | ICD-10-CM | POA: Diagnosis present

## 2016-09-23 DIAGNOSIS — Y900 Blood alcohol level of less than 20 mg/100 ml: Secondary | ICD-10-CM | POA: Diagnosis present

## 2016-09-23 DIAGNOSIS — R6521 Severe sepsis with septic shock: Secondary | ICD-10-CM | POA: Diagnosis present

## 2016-09-23 DIAGNOSIS — Z66 Do not resuscitate: Secondary | ICD-10-CM | POA: Diagnosis present

## 2016-09-23 DIAGNOSIS — K7210 Chronic hepatic failure without coma: Secondary | ICD-10-CM | POA: Diagnosis present

## 2016-09-23 DIAGNOSIS — Z91148 Patient's other noncompliance with medication regimen for other reason: Secondary | ICD-10-CM

## 2016-09-23 DIAGNOSIS — R579 Shock, unspecified: Secondary | ICD-10-CM | POA: Diagnosis not present

## 2016-09-23 DIAGNOSIS — N390 Urinary tract infection, site not specified: Secondary | ICD-10-CM

## 2016-09-23 HISTORY — DX: Unspecified cirrhosis of liver: K74.60

## 2016-09-23 HISTORY — DX: Personal history of other diseases of the digestive system: Z87.19

## 2016-09-23 HISTORY — DX: Unspecified dementia, unspecified severity, without behavioral disturbance, psychotic disturbance, mood disturbance, and anxiety: F03.90

## 2016-09-23 LAB — RAPID URINE DRUG SCREEN, HOSP PERFORMED
AMPHETAMINES: NOT DETECTED
BENZODIAZEPINES: POSITIVE — AB
Barbiturates: NOT DETECTED
COCAINE: NOT DETECTED
OPIATES: NOT DETECTED
Tetrahydrocannabinol: NOT DETECTED

## 2016-09-23 LAB — ETHANOL: Alcohol, Ethyl (B): <5 mg/dL (ref <5)

## 2016-09-23 LAB — CBC WITH DIFFERENTIAL/PLATELET
BASOS ABS: 0.1 10*3/uL (ref 0.0–0.1)
BASOS PCT: 1 %
EOS ABS: 0.1 10*3/uL (ref 0.0–0.7)
Eosinophils Relative: 1 %
HCT: 29.4 % — ABNORMAL LOW (ref 36.0–46.0)
HEMOGLOBIN: 9.6 g/dL — AB (ref 12.0–15.0)
LYMPHS ABS: 3.7 10*3/uL (ref 0.7–4.0)
Lymphocytes Relative: 35 %
MCH: 33.6 pg (ref 26.0–34.0)
MCHC: 32.7 g/dL (ref 30.0–36.0)
MCV: 102.8 fL — ABNORMAL HIGH (ref 78.0–100.0)
Monocytes Absolute: 1 10*3/uL (ref 0.1–1.0)
Monocytes Relative: 10 %
NEUTROS PCT: 53 %
Neutro Abs: 5.6 10*3/uL (ref 1.7–7.7)
Platelets: 84 10*3/uL — ABNORMAL LOW (ref 150–400)
RBC: 2.86 MIL/uL — AB (ref 3.87–5.11)
RDW: 20.2 % — ABNORMAL HIGH (ref 11.5–15.5)
WBC: 10.5 10*3/uL (ref 4.0–10.5)

## 2016-09-23 LAB — URINALYSIS, ROUTINE W REFLEX MICROSCOPIC
Bilirubin Urine: NEGATIVE
Glucose, UA: NEGATIVE mg/dL
KETONES UR: 5 mg/dL — AB
Nitrite: NEGATIVE
PH: 8 (ref 5.0–8.0)
Protein, ur: 100 mg/dL — AB
SPECIFIC GRAVITY, URINE: 1.015 (ref 1.005–1.030)

## 2016-09-23 LAB — BRAIN NATRIURETIC PEPTIDE: B Natriuretic Peptide: 316 pg/mL — ABNORMAL HIGH (ref 0.0–100.0)

## 2016-09-23 LAB — COMPREHENSIVE METABOLIC PANEL
ALBUMIN: 1.7 g/dL — AB (ref 3.5–5.0)
ALK PHOS: 126 U/L (ref 38–126)
ALT: 18 U/L (ref 14–54)
ANION GAP: 11 (ref 5–15)
AST: 61 U/L — ABNORMAL HIGH (ref 15–41)
BUN: 37 mg/dL — ABNORMAL HIGH (ref 6–20)
CALCIUM: 8.1 mg/dL — AB (ref 8.9–10.3)
CO2: 21 mmol/L — AB (ref 22–32)
Chloride: 111 mmol/L (ref 101–111)
Creatinine, Ser: 2.78 mg/dL — ABNORMAL HIGH (ref 0.44–1.00)
GFR calc Af Amer: 20 mL/min — ABNORMAL LOW (ref >60)
GFR calc non Af Amer: 18 mL/min — ABNORMAL LOW (ref >60)
GLUCOSE: 64 mg/dL — AB (ref 65–99)
Potassium: 5.3 mmol/L — ABNORMAL HIGH (ref 3.5–5.1)
SODIUM: 143 mmol/L (ref 135–145)
Total Bilirubin: 2.8 mg/dL — ABNORMAL HIGH (ref 0.3–1.2)
Total Protein: 6.5 g/dL (ref 6.5–8.1)

## 2016-09-23 LAB — SALICYLATE LEVEL

## 2016-09-23 LAB — TYPE AND SCREEN
ABO/RH(D): AB NEG
ANTIBODY SCREEN: NEGATIVE

## 2016-09-23 LAB — PROTIME-INR
INR: 2.05
PROTHROMBIN TIME: 23.4 s — AB (ref 11.4–15.2)

## 2016-09-23 LAB — ACETAMINOPHEN LEVEL: Acetaminophen (Tylenol), Serum: <10 ug/mL — ABNORMAL LOW (ref 10–30)

## 2016-09-23 LAB — TROPONIN I: TROPONIN I: 0.13 ng/mL — AB (ref <0.03)

## 2016-09-23 LAB — LACTIC ACID, PLASMA: Lactic Acid, Venous: 4.6 mmol/L (ref 0.5–1.9)

## 2016-09-23 LAB — AMMONIA: Ammonia: 157 umol/L — ABNORMAL HIGH (ref 9–35)

## 2016-09-23 MED ORDER — LORAZEPAM 2 MG/ML IJ SOLN
0.5000 mg | Freq: Once | INTRAMUSCULAR | Status: DC
  Filled 2016-09-23: qty 1

## 2016-09-23 MED ORDER — MORPHINE BOLUS VIA INFUSION
1.0000 mg | INTRAVENOUS | Status: DC | PRN
  Filled 2016-09-23: qty 1

## 2016-09-23 MED ORDER — POLYVINYL ALCOHOL 1.4 % OP SOLN: 1.0000 [drp] | Freq: Four times a day (QID) | OPHTHALMIC | Status: DC | PRN

## 2016-09-23 MED ORDER — SODIUM CHLORIDE 0.9 % IV SOLN
5.0000 mg/h | INTRAVENOUS | Status: DC
  Administered 2016-09-23: 5 mg/h via INTRAVENOUS
  Filled 2016-09-23: qty 10

## 2016-09-23 MED ORDER — HALOPERIDOL LACTATE 5 MG/ML IJ SOLN: 0.5000 mg | INTRAMUSCULAR | Status: DC | PRN

## 2016-09-23 MED ORDER — SODIUM CHLORIDE 0.9 % IV BOLUS (SEPSIS)
1000.0000 mL | Freq: Once | INTRAVENOUS | Status: AC
  Administered 2016-09-23: 1000 mL via INTRAVENOUS

## 2016-09-23 MED ORDER — HALOPERIDOL LACTATE 2 MG/ML PO CONC
0.5000 mg | ORAL | Status: DC | PRN
  Filled 2016-09-23: qty 0.3

## 2016-09-23 MED ORDER — BIOTENE DRY MOUTH MT LIQD: 15.0000 mL | OROMUCOSAL | Status: DC | PRN

## 2016-09-23 MED ORDER — SODIUM CHLORIDE 0.9 % IV SOLN
INTRAVENOUS | Status: DC
Start: 2016-09-23 — End: 2016-09-23
  Administered 2016-09-23 (×3): via INTRAVENOUS

## 2016-09-23 MED ORDER — ACETAMINOPHEN 325 MG PO TABS: 650.0000 mg | ORAL_TABLET | Freq: Four times a day (QID) | ORAL | Status: DC | PRN

## 2016-09-23 MED ORDER — ONDANSETRON HCL 4 MG/2ML IJ SOLN: 4.0000 mg | Freq: Four times a day (QID) | INTRAMUSCULAR | Status: DC | PRN

## 2016-09-23 MED ORDER — GLYCOPYRROLATE 0.2 MG/ML IJ SOLN
0.2000 mg | INTRAMUSCULAR | Status: DC | PRN
  Filled 2016-09-23: qty 1

## 2016-09-23 MED ORDER — ALBUTEROL SULFATE (2.5 MG/3ML) 0.083% IN NEBU
2.5000 mg | INHALATION_SOLUTION | RESPIRATORY_TRACT | Status: DC | PRN
  Administered 2016-09-24: 2.5 mg via RESPIRATORY_TRACT
  Filled 2016-09-23: qty 3

## 2016-09-23 MED ORDER — MORPHINE SULFATE 25 MG/ML IV SOLN
INTRAVENOUS | Status: AC
  Filled 2016-09-23: qty 10

## 2016-09-23 MED ORDER — DEXTROSE 5 % IV SOLN
1.0000 g | Freq: Once | INTRAVENOUS | Status: AC
  Administered 2016-09-23: 1 g via INTRAVENOUS
  Filled 2016-09-23: qty 10

## 2016-09-23 MED ORDER — GLYCOPYRROLATE 1 MG PO TABS
1.0000 mg | ORAL_TABLET | ORAL | Status: DC | PRN
  Filled 2016-09-23: qty 1

## 2016-09-23 MED ORDER — ACETAMINOPHEN 650 MG RE SUPP
650.0000 mg | Freq: Four times a day (QID) | RECTAL | Status: DC | PRN
Start: 2016-09-23 — End: 2016-09-25

## 2016-09-23 MED ORDER — ONDANSETRON 4 MG PO TBDP: 4.0000 mg | ORAL_TABLET | Freq: Four times a day (QID) | ORAL | Status: DC | PRN

## 2016-09-23 MED ORDER — HALOPERIDOL 0.5 MG PO TABS
0.5000 mg | ORAL_TABLET | ORAL | Status: DC | PRN
  Filled 2016-09-23: qty 1

## 2016-09-24 ENCOUNTER — Encounter (HOSPITAL_COMMUNITY): Payer: Self-pay

## 2016-09-24 MED ORDER — ACETYLCYSTEINE 20 % IN SOLN
RESPIRATORY_TRACT | Status: AC
  Administered 2016-09-24: 800 mg
  Filled 2016-09-24: qty 4

## 2016-09-24 MED ORDER — ACETYLCYSTEINE 10 % IN SOLN
4.0000 mL | Freq: Once | RESPIRATORY_TRACT | Status: AC
Start: 2016-09-24 — End: 2016-09-24
  Filled 2016-09-24: qty 4

## 2016-09-24 NOTE — Progress Notes (Signed)
Patient's skin extremely dry and sloughing off. Lacerations to upper arms, excoriated to bilateral AC's , and multiple skin tears to bilateral forearms. Vaseline gauze and kerlex applied to bilateral arms. Skin to chest and abdomen extremely dry and sloughing off. Under bilateral breast excoriated and bleeding. Excoriated and bleeding to bilateral abd fold/groin areas. Bilateral legs dry with sloughing skin. Right ankle excoriated and bleeding, cleansed and vaseline gauze and gauze applied. Back excoriated from top down to upper thighs. Sacral foam applied to sacrum. Burn sheet applied to bed to prevent further irritation. Weeping noted from groin and legs. MD and Nursing supervisor aware of Pt's skin condition.

## 2016-09-24 NOTE — Progress Notes (Signed)
Lab called a critical value of blood cultures showing anaerobic gram positive cocci in chains.  Dr. Thedore MinsSingh notified via text page.  Dr. Thedore MinsSingh responded to continue current plan of care with no new orders at this time.

## 2016-09-24 NOTE — Progress Notes (Signed)
Dr. Thedore MinsSingh gave order to NT suctioning as needed.  RT notified.

## 2016-09-25 ENCOUNTER — Encounter: Payer: Self-pay | Admitting: Internal Medicine

## 2016-09-25 LAB — I-STAT CHEM 8, ED
BUN: 32 mg/dL — AB (ref 6–20)
CHLORIDE: 111 mmol/L (ref 101–111)
Calcium, Ion: 1 mmol/L — ABNORMAL LOW (ref 1.15–1.40)
Creatinine, Ser: 2.8 mg/dL — ABNORMAL HIGH (ref 0.44–1.00)
Glucose, Bld: 56 mg/dL — ABNORMAL LOW (ref 65–99)
HEMATOCRIT: 26 % — AB (ref 36.0–46.0)
Hemoglobin: 8.8 g/dL — ABNORMAL LOW (ref 12.0–15.0)
Potassium: 5.5 mmol/L — ABNORMAL HIGH (ref 3.5–5.1)
Sodium: 143 mmol/L (ref 135–145)
TCO2: 19 mmol/L (ref 0–100)

## 2016-09-25 LAB — BLOOD CULTURE ID PANEL (REFLEXED)
Acinetobacter baumannii: NOT DETECTED
CANDIDA ALBICANS: NOT DETECTED
CANDIDA TROPICALIS: NOT DETECTED
Candida glabrata: NOT DETECTED
Candida krusei: NOT DETECTED
Candida parapsilosis: NOT DETECTED
Carbapenem resistance: NOT DETECTED
ENTEROBACTER CLOACAE COMPLEX: NOT DETECTED
ENTEROBACTERIACEAE SPECIES: DETECTED — AB
ENTEROCOCCUS SPECIES: NOT DETECTED
Escherichia coli: NOT DETECTED
HAEMOPHILUS INFLUENZAE: NOT DETECTED
Klebsiella oxytoca: NOT DETECTED
Klebsiella pneumoniae: NOT DETECTED
Listeria monocytogenes: NOT DETECTED
NEISSERIA MENINGITIDIS: NOT DETECTED
PROTEUS SPECIES: DETECTED — AB
Pseudomonas aeruginosa: NOT DETECTED
STAPHYLOCOCCUS AUREUS BCID: NOT DETECTED
STREPTOCOCCUS PYOGENES: NOT DETECTED
STREPTOCOCCUS SPECIES: NOT DETECTED
Serratia marcescens: NOT DETECTED
Staphylococcus species: NOT DETECTED
Streptococcus agalactiae: NOT DETECTED
Streptococcus pneumoniae: NOT DETECTED
VANCOMYCIN RESISTANCE: NOT DETECTED

## 2016-09-25 LAB — I-STAT TROPONIN, ED: Troponin i, poc: 0.12 ng/mL (ref 0.00–0.08)

## 2016-09-25 LAB — I-STAT CG4 LACTIC ACID, ED
Lactic Acid, Venous: 4.62 mmol/L (ref 0.5–1.9)
Lactic Acid, Venous: 5.31 mmol/L (ref 0.5–1.9)

## 2016-09-25 NOTE — Progress Notes (Signed)
Pt's family called nursing staff to room, entered room and pt did not have heartbeat or respirations. Time of death 09/24/16 at 2325. Pronounced by Linwood DibblesKristi Marzella Miracle RN and Ulla GalloJames Daniel RN. Emotional support given to family. Dr. Sharl MaLama paged and informed of death. [employer] called and denied for all donations.

## 2016-09-25 NOTE — Discharge Summary (Signed)
[facility] Death Note                                                                                                                                                                                               [name] HQI:[id number] DOB: 19-Feb-1956 DOA: 2016/09/24  PCP: Vertis KelchALLEN, DEBRA, NP  Admit date: 2016/09/24  Date of Death: 09-24-16      Time of Death: 2325  Pronounced By - RN  Notification: Vertis KelchALLEN, DEBRA, NP notified of death of 09/25/2016   History of present illness:   [name] is a 61 y.o. 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   Final Diagnoses:  Cause if death - Alcoholic Liver Cirrhosis  Signature  Leroy SeaSINGH,PRASHANT K M.D on 09/25/2016 at 6:04 AM  [facility]  Office Phone -[phone]  Total clinical and documentation time for today Under 30 minutes   Last Note                                                                    PROGRESS NOTE  Patient Demographics:    [name], is a 61 y.o. female, DOB - Dec 03, 1955, [id number]  Admit date - 10/14/2016   Admitting Physician Jonah Blue, MD  Outpatient Primary MD for the patient is Vertis Kelch, NP  LOS - 1  Chief Complaint  Patient presents with  . Altered Mental Status       Brief Narrative  61 y.o. female with medical history significant of end-stage liver disease from alcoholic cirrhosis with alcoholic dementia, CVA in 2008, HTN, GERD, and thrombocytopenia who presented with altered mental status from home.  She was  recently at an SNF where she signed herself out, had failing health for the last several months, when she came to the ER she was found to be obtunded, had a diffuse exfoliative dermatitis, admitting M.D. and the ER staff for detailed discussions with patient's family and it was decided that she will be admitted with full comfort measures with goal of care with comfort only.   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 Poor health and quality of life at baseline, as in history of present illness above she is admitted for comfort. On morphine drip. Goal of care is comfort only. She is DO NOT RESUSCITATE. We anticipate death soon.    Family Communication  :  Daughter bedside  Code Status : DNR  Diet : Diet NPO time specified    Disposition Plan  :  TBD  Consults  :  None  Procedures  :  None  DVT Prophylaxis  :  None  Lab Results  Component Value Date   PLT 84 (L) 2016/10/14    Inpatient Medications  Scheduled Meds: Continuous Infusions: . morphine 5 mg/hr (14-Oct-2016 2250)   PRN Meds:.acetaminophen **OR** acetaminophen, albuterol, antiseptic oral rinse, glycopyrrolate **OR** glycopyrrolate **OR** glycopyrrolate, haloperidol **OR** [DISCONTINUED] haloperidol **OR** haloperidol lactate, morphine, ondansetron **OR** ondansetron (ZOFRAN) IV, polyvinyl alcohol  Antibiotics  :    Anti-infectives    Start     Dose/Rate Route Frequency Ordered Stop   10/14/16 1615  cefTRIAXone (ROCEPHIN) 1 g in dextrose 5 % 50 mL IVPB     1 g 100 mL/hr over 30 Minutes Intravenous  Once Oct 14, 2016 1611 Oct 14, 2016 1750         Objective:   Vitals:   10-14-16 2000 October 14, 2016 2015 2016-10-14 2056 09/24/16 1137  BP: 104/64 100/56 (!) 131/99   Pulse: (!) 142 (!) 143 (!) 139   Resp: (!) 27 23 (!) 24   Temp: 98.1 F (36.7 C)  98.2 F (36.8 C) 98.5 F (36.9 C)   TempSrc:   Axillary   SpO2: 100% 100% 100% 98%  Weight:      Height:        Wt Readings from Last 3 Encounters:  2016/10/14 70.3 kg (155 lb)     Intake/Output Summary (Last 24 hours) at 09/25/16 0604 Last data filed at 09/24/16 1700  Gross per 24 hour  Intake                0 ml  Output                0 ml  Net                0 ml     Physical Exam  Somnolent and unresponsive, [place].AT,  Supple Neck,No JVD, No cervical lymphadenopathy appriciated.  Symmetrical Chest wall movement, Good air movement bilaterally, CTAB RRR,No Gallops,Rubs or new Murmurs, No Parasternal Heave +ve B.Sounds, Abd Soft, No tenderness, No organomegaly appriciated, No rebound - guarding or rigidity. Diffuse exfoliative dermatitis     Data Review:    CBC  Recent Labs Lab Sep 30, 2016 1445  WBC 10.5  HGB 9.6*  HCT 29.4*  PLT 84*  MCV 102.8*  MCH 33.6  MCHC 32.7  RDW 20.2*  LYMPHSABS 3.7  MONOABS 1.0  EOSABS 0.1  BASOSABS 0.1    Chemistries   Recent Labs Lab 09-30-2016 1445  NA 143  K 5.3*  CL 111  CO2 21*  GLUCOSE 64*  BUN 37*  CREATININE 2.78*  CALCIUM 8.1*  AST 61*  ALT 18  ALKPHOS 126  BILITOT 2.8*   ------------------------------------------------------------------------------------------------------------------ No results for input(s): CHOL, HDL, LDLCALC, TRIG, CHOLHDL, LDLDIRECT in the last 72 hours.  No results found for: HGBA1C ------------------------------------------------------------------------------------------------------------------ No results for input(s): TSH, T4TOTAL, T3FREE, THYROIDAB in the last 72 hours.  Invalid input(s): FREET3 ------------------------------------------------------------------------------------------------------------------ No results for input(s): VITAMINB12, FOLATE, FERRITIN, TIBC, IRON, RETICCTPCT in the last 72 hours.  Coagulation profile  Recent Labs Lab September 30, 2016 1445  INR 2.05    No  results for input(s): DDIMER in the last 72 hours.  Cardiac Enzymes  Recent Labs Lab 09-30-2016 1445  TROPONINI 0.13*   ------------------------------------------------------------------------------------------------------------------    Component Value Date/Time   BNP 316.0 (H) 2016-09-30 1445    Micro Results Recent Results (from the past 240 hour(s))  Culture, blood (routine x 2)     Status: None (Preliminary result)   Collection Time: 2016/09/30  2:45 PM  Result Value Ref Range Status   Specimen Description BLOOD IV DRAWN BY RN  Final   Special Requests BOTTLES DRAWN AEROBIC AND ANAEROBIC 6CC EACJ  Final   Culture  Setup Time   Final    GRAM POSITIVE COCCI ANAEROBIC BOTTLE ONLY CRITICAL RESULT CALLED TO, READ BACK BY AND VERIFIED WITH: HAWKINS. M AT 1103 ON 09/24/2016 BY Veneziano.M Performed at [facility]    Culture [facility]  Final   Report Status PENDING  Incomplete  Culture, blood (routine x 2)     Status: None (Preliminary result)   Collection Time: 30-Sep-2016  3:16 PM  Result Value Ref Range Status   Specimen Description RIGHT ANTECUBITAL  Final   Special Requests BOTTLES DRAWN AEROBIC AND ANAEROBIC [facility] EACH  Final   Culture  Setup Time   Final    GRAM POSITIVE COCCI GRAM NEGATIVE RODS ANAEROBIC BOTTLE ONLY CRITICAL RESULT CALLED TO, READ BACK BY AND VERIFIED WITH: HAWKINS,M. AT 1440 ON 09/24/2016 BY EVA CRITICAL RESULT CALLED TO, READ BACK BY AND VERIFIED WITH: H TETREAULT,RN AT 1959 09/24/16 BY L BENFIELD Performed at [facility]    Culture GRAM POSITIVE COCCI GRAM NEGATIVE RODS   Final   Report Status PENDING  Incomplete    Radiology Reports Dg Chest 1 View  Result Date: 30-Sep-2016 CLINICAL DATA:  Altered mental status. EXAM: CHEST 1 VIEW COMPARISON:  None. FINDINGS: Normal sized heart. Clear lungs with normal vascularity. Mild diffuse peribronchial thickening. Mild to moderate scoliosis. IMPRESSION: Mild bronchitic changes.  Electronically Signed   By: Beckie Salts M.D.   On: 09-30-2016 15:35   Ct Head Wo Contrast  Result Date: 2016/09/30 CLINICAL DATA:  AMS, young family member called EMS rregarding pt unresponsive Pt is filthy with friable skin  multiple skin tears and fixed pupils. No medical hx available EXAM: CT HEAD WITHOUT CONTRAST TECHNIQUE: Contiguous axial images were obtained from the base of the skull through the vertex without intravenous contrast. COMPARISON:  None. FINDINGS: Brain: There is an old infarct of the left frontal lobe. There is no evidence of a recent infarct. There are no parenchymal masses or mass effect. The ventricles are normal configuration. There is ventricular sulcal enlargement reflecting mild generalized atrophy. No hydrocephalus. There are no extra-axial masses or abnormal fluid collections. There is no intracranial hemorrhage. Vascular: No hyperdense vessel or unexpected calcification. Skull: Normal. Negative for fracture or focal lesion. Sinuses/Orbits: Dependent fluid is seen in both maxillary sinuses. Remaining visualized sinuses are clear. Clear mastoid air cells. Other: None. IMPRESSION: 1. No acute intracranial abnormalities. 2. Mild atrophy.  Old left frontal lobe infarct. 3. Bilateral maxillary sinus air-fluid levels. Consider acute sinusitis in the proper clinical setting. Electronically Signed   By: Amie Portland M.D.   On: October 20, 2016 15:56    Time Spent in minutes  30   SINGH,PRASHANT K M.D on 09/25/2016 at 6:04 AM  Between 7am to 7pm - Pager - [phone]  After 7pm go to [email or web address] - password [facility]  [facility] -  Office  [phone]

## 2016-09-26 ENCOUNTER — Ambulatory Visit: Payer: Medicaid Other | Admitting: Gastroenterology

## 2016-09-26 LAB — URINE CULTURE

## 2016-09-26 LAB — CULTURE, BLOOD (ROUTINE X 2)

## 2016-09-28 LAB — CULTURE, BLOOD (ROUTINE X 2)

## 2016-09-29 ENCOUNTER — Ambulatory Visit (INDEPENDENT_AMBULATORY_CARE_PROVIDER_SITE_OTHER): Payer: Self-pay | Admitting: Orthopedic Surgery

## 2016-10-12 NOTE — ED Notes (Signed)
Writer witnessed EDP speak with patients daughter and brother about DRN and patients wishes in family room.  Paper DNR signed by myself and EDP.

## 2016-10-12 NOTE — Progress Notes (Addendum)
PROGRESS NOTE                                                                                                                                                                                                             Patient Demographics:    Debbie LintsDeborah Valentine, is a 61 y.o. female, DOB - 1956/04/16, UJW:[id number]  Admit date - 2017/06/03   Admitting Physician Jonah BlueJennifer Yates, MD  Outpatient Primary MD for the patient is Vertis KelchALLEN, DEBRA, NP  LOS - 1  Chief Complaint  Patient presents with  . Altered Mental Status       Brief Narrative  61 y.o. female with medical history significant of end-stage liver disease from alcoholic cirrhosis with alcoholic dementia, CVA in 2008, HTN, GERD, and thrombocytopenia who presented with altered mental status from home.  She was recently at an SNF where she signed herself out, had failing health for the last several months, when she came to the ER she was found to be obtunded, had a diffuse exfoliative dermatitis, admitting M.D. and the ER staff for detailed discussions with patient's family and it was decided that she will be admitted with full comfort measures with goal of care with comfort only.   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 Poor health and quality of life at baseline, as in history of present illness above she is admitted for comfort. On morphine drip. Goal of care is comfort only. She is DO NOT RESUSCITATE. We anticipate death soon.    Family Communication  :  Daughter bedside  Code Status : DNR  Diet : Diet NPO time specified    Disposition Plan  :  TBD  Consults  :  None  Procedures  :  None  DVT Prophylaxis  :  None  Lab Results  Component Value Date   PLT 84  (L) 02018/09/23    Inpatient Medications  Scheduled Meds: Continuous Infusions: . morphine 5 mg/hr (05-Feb-2017 2250)   PRN Meds:.acetaminophen **OR** acetaminophen, albuterol, antiseptic oral rinse, glycopyrrolate **OR** glycopyrrolate **OR** glycopyrrolate, haloperidol **OR** [DISCONTINUED] haloperidol **OR** haloperidol lactate, morphine, ondansetron **OR** ondansetron (ZOFRAN) IV, polyvinyl alcohol  Antibiotics  :    Anti-infectives    Start     Dose/Rate Route Frequency Ordered Stop   Oct 12, 2016 1615  cefTRIAXone (ROCEPHIN) 1 g in dextrose 5 % 50 mL IVPB     1 g 100 mL/hr over 30 Minutes Intravenous  Once Oct 12, 2016 1611 12-Oct-2016 1750         Objective:   Vitals:   12-Oct-2016 1945 October 12, 2016 2000 Oct 12, 2016 2015 2016-10-12 2056  BP: 91/61 104/64 100/56 (!) 131/99  Pulse: (!) 142 (!) 142 (!) 143 (!) 139  Resp: 26 (!) 27 23 (!) 24  Temp: 97.9 F (36.6 C) 98.1 F (36.7 C) 98.2 F (36.8 C) 98.5 F (36.9 C)  TempSrc:    Axillary  SpO2: 100% 100% 100% 100%  Weight:      Height:        Wt Readings from Last 3 Encounters:  Oct 12, 2016 70.3 kg (155 lb)     Intake/Output Summary (Last 24 hours) at 09/24/16 0955 Last data filed at 09/24/16 0300  Gross per 24 hour  Intake          2070.83 ml  Output                0 ml  Net          2070.83 ml     Physical Exam  Somnolent and unresponsive, [place].AT,  Supple Neck,No JVD, No cervical lymphadenopathy appriciated.  Symmetrical Chest wall movement, Good air movement bilaterally, CTAB RRR,No Gallops,Rubs or new Murmurs, No Parasternal Heave +ve B.Sounds, Abd Soft, No tenderness, No organomegaly appriciated, No rebound - guarding or rigidity. Diffuse exfoliative dermatitis     Data Review:    CBC  Recent Labs Lab 2016-10-12 1445  WBC 10.5  HGB 9.6*  HCT 29.4*  PLT 84*  MCV 102.8*  MCH 33.6  MCHC 32.7  RDW 20.2*  LYMPHSABS 3.7  MONOABS 1.0  EOSABS 0.1  BASOSABS 0.1    Chemistries   Recent Labs Lab 2016-10-12 1445    NA 143  K 5.3*  CL 111  CO2 21*  GLUCOSE 64*  BUN 37*  CREATININE 2.78*  CALCIUM 8.1*  AST 61*  ALT 18  ALKPHOS 126  BILITOT 2.8*   ------------------------------------------------------------------------------------------------------------------ No results for input(s): CHOL, HDL, LDLCALC, TRIG, CHOLHDL, LDLDIRECT in the last 72 hours.  No results found for: HGBA1C ------------------------------------------------------------------------------------------------------------------ No results for input(s): TSH, T4TOTAL, T3FREE, THYROIDAB in the last 72 hours.  Invalid input(s): FREET3 ------------------------------------------------------------------------------------------------------------------ No results for input(s): VITAMINB12, FOLATE, FERRITIN, TIBC, IRON, RETICCTPCT in the last 72 hours.  Coagulation profile  Recent Labs Lab 10-12-16 1445  INR 2.05    No results for input(s): DDIMER in the last 72 hours.  Cardiac Enzymes  Recent Labs Lab 10/12/2016 1445  TROPONINI 0.13*   ------------------------------------------------------------------------------------------------------------------    Component Value Date/Time   BNP 316.0 (H) 2016/10/12 1445    Micro Results Recent Results (from the past 240 hour(s))  Culture, blood (routine x 2)     Status: None (Preliminary result)   Collection Time: 10-12-16  2:45 PM  Result Value Ref Range Status   Specimen Description BLOOD IV DRAWN BY RN  Final   Special Requests BOTTLES DRAWN AEROBIC AND ANAEROBIC 6CC EACJ  Final   Culture NO GROWTH < 24 HOURS  Final   Report Status PENDING  Incomplete  Culture, blood (routine x 2)     Status: None (Preliminary result)   Collection Time: 2016-10-12  3:16 PM  Result Value Ref  Range Status   Specimen Description RIGHT ANTECUBITAL  Final   Special Requests BOTTLES DRAWN AEROBIC AND ANAEROBIC 6CC EACH  Final   Culture NO GROWTH < 24 HOURS  Final   Report Status PENDING   Incomplete    Radiology Reports Dg Chest 1 View  Result Date: October 12, 2016 CLINICAL DATA:  Altered mental status. EXAM: CHEST 1 VIEW COMPARISON:  None. FINDINGS: Normal sized heart. Clear lungs with normal vascularity. Mild diffuse peribronchial thickening. Mild to moderate scoliosis. IMPRESSION: Mild bronchitic changes. Electronically Signed   By: Beckie Salts M.D.   On: 2016-10-12 15:35   Ct Head Wo Contrast  Result Date: Oct 12, 2016 CLINICAL DATA:  AMS, young family member called EMS rregarding pt unresponsive Pt is filthy with friable skin multiple skin tears and fixed pupils. No medical hx available EXAM: CT HEAD WITHOUT CONTRAST TECHNIQUE: Contiguous axial images were obtained from the base of the skull through the vertex without intravenous contrast. COMPARISON:  None. FINDINGS: Brain: There is an old infarct of the left frontal lobe. There is no evidence of a recent infarct. There are no parenchymal masses or mass effect. The ventricles are normal configuration. There is ventricular sulcal enlargement reflecting mild generalized atrophy. No hydrocephalus. There are no extra-axial masses or abnormal fluid collections. There is no intracranial hemorrhage. Vascular: No hyperdense vessel or unexpected calcification. Skull: Normal. Negative for fracture or focal lesion. Sinuses/Orbits: Dependent fluid is seen in both maxillary sinuses. Remaining visualized sinuses are clear. Clear mastoid air cells. Other: None. IMPRESSION: 1. No acute intracranial abnormalities. 2. Mild atrophy.  Old left frontal lobe infarct. 3. Bilateral maxillary sinus air-fluid levels. Consider acute sinusitis in the proper clinical setting. Electronically Signed   By: Amie Portland M.D.   On: 2016/10/12 15:56    Time Spent in minutes  30   Amera Banos K M.D on 09/24/2016 at 9:55 AM  Between 7am to 7pm - Pager - [phone]  After 7pm go to [email or web address] - password [facility]  [facility] -  Office  [phone]

## 2016-10-12 NOTE — ED Provider Notes (Signed)
[facility] DEPT Provider Note   CSN: [id number] Arrival date & time: 28-Oct-2016  1426     History   Chief Complaint Chief Complaint  Patient presents with  . Altered Mental Status    HPI [name] is a 61 y.o. female.  The history is provided by the EMS personnel and a relative. The history is limited by the condition of the patient (AMS).  Altered Mental Status    Pt was seen at 1430. Per EMS and family report: Pt with long hx chronic liver disease, GI bleeding, etoh abuse, and medical non-compliance. Pt "checked herself out of the Nursing Home" 2 weeks ago and went to her husband's house, per daughter. Pt's husband states there "was an issue with her Medicaid" and pt has not had any of her medications in 2 weeks. Husband has been giving pt "some xanax and lortab" because "of pain in her hip from the operation and she couldn't sleep."  Pt has not been taking PO food or fluids for the past 2 weeks. Pt's family states pt will refuse to come to the ED for evaluation "when she's awake" so "we have to wait until she passes out to get her here." Family states pt has been unresponsive since yesterday.    History reviewed. No pertinent past medical history.  There are no active problems to display for this patient.   History reviewed. No pertinent surgical history.  OB History    No data available       Home Medications    Prior to Admission medications   Not on File    Family History No family history on file.  Social History Social History  Substance Use Topics  . Smoking status: Unknown If Ever Smoked  . Smokeless tobacco: Not on file  . Alcohol use Not on file     Allergies   Patient has no known allergies.   Review of Systems Review of Systems  Unable to perform ROS: Mental status change     Physical Exam Updated Vital Signs BP 96/59   Pulse (!) 126   Temp 97 F (36.1 C)   Resp 24   Ht 5\' 6"  (1.676 m)   Wt 155 lb (70.3 kg)   SpO2 100%    BMI 25.02 kg/m   Physical Exam 1430: Physical examination:  Nursing notes reviewed; Vital signs and O2 SAT reviewed;  Constitutional: Well developed, Well nourished, In no acute distress; Head:  Normocephalic, atraumatic; Eyes: EOMI, PERRL, No scleral icterus; ENMT: Mouth and pharynx normal, Mucous membranes dry and cracked; Neck: Supple, Full range of motion, No lymphadenopathy; Cardiovascular: Tachycardic rate and rhythm, No gallop; Respiratory: Breath sounds coarse & equal bilaterally, No wheezes. Normal respiratory effort/excursion; Chest: Nontender, Movement normal; Abdomen: Soft, Nontender, Nondistended, Normal bowel sounds; Genitourinary: No CVA tenderness; Extremities: Pulses normal, No deformity, No edema, No calf edema or asymmetry.; Neuro: Lethargic, eyes open, no verbalizations. Will minimally move extremities on stretcher spontaneously.; Skin: Color normal, Warm, very dry, peeling, cracked, and bleeding.   ED Treatments / Results  Labs (all labs ordered are listed, but only abnormal results are displayed)   EKG  EKG Interpretation None       Radiology   Procedures Procedures (including critical care time)  Medications Ordered in ED Medications  0.9 %  sodium chloride infusion ( Intravenous New Bag/Given 28-Oct-2016 1737)  sodium chloride 0.9 % bolus 1,000 mL (0 mLs Intravenous Stopped 28-Oct-2016 1529)  sodium chloride 0.9 % bolus 1,000 mL (  0 mLs Intravenous Stopped 08-Oct-2016 1713)  cefTRIAXone (ROCEPHIN) 1 g in dextrose 5 % 50 mL IVPB (0 g Intravenous Stopped October 08, 2016 1750)     Initial Impression / Assessment and Plan / ED Course  I have reviewed the triage vital signs and the nursing notes.  Pertinent labs & imaging results that were available during my care of the patient were reviewed by me and considered in my medical decision making (see chart for details).  MDM Reviewed: previous chart and nursing note Reviewed previous: labs and ECG Interpretation: labs, ECG,  x-ray and CT scan Total time providing critical care: 30-74 minutes. This excludes time spent performing separately reportable procedures and services. Consults: admitting MD   CRITICAL CARE Performed by: Laray Anger Total critical care time: 60 minutes Critical care time was exclusive of separately billable procedures and treating other patients. Critical care was necessary to treat or prevent imminent or life-threatening deterioration. Critical care was time spent personally by me on the following activities: development of treatment plan with patient and/or surrogate as well as nursing, discussions with consultants, evaluation of patient's response to treatment, examination of patient, obtaining history from patient or surrogate, ordering and performing treatments and interventions, ordering and review of laboratory studies, ordering and review of radiographic studies, pulse oximetry and re-evaluation of patient's condition.  ED ECG REPORT   Date: 2016/10/08  Rate: 124  Rhythm: sinus tachycardia  QRS Axis: normal  Intervals: QT prolonged  ST/T Wave abnormalities: nonspecific ST/T changes  Conduction Disutrbances:none  Narrative Interpretation: artifact  Old EKG Reviewed: none available   Results for orders placed or performed during the hospital encounter of 10/08/16  Culture, blood (routine x 2)  Result Value Ref Range   Specimen Description BLOOD IV DRAWN BY RN    Special Requests BOTTLES DRAWN AEROBIC AND ANAEROBIC 6CC EACJ    Culture PENDING    Report Status PENDING   Culture, blood (routine x 2)  Result Value Ref Range   Specimen Description RIGHT ANTECUBITAL    Special Requests BOTTLES DRAWN AEROBIC AND ANAEROBIC 6CC EACH    Culture PENDING    Report Status PENDING   Urine rapid drug screen (hosp performed)  Result Value Ref Range   Opiates NONE DETECTED NONE DETECTED   Cocaine NONE DETECTED NONE DETECTED   Benzodiazepines POSITIVE (A) NONE DETECTED    Amphetamines NONE DETECTED NONE DETECTED   Tetrahydrocannabinol NONE DETECTED NONE DETECTED   Barbiturates NONE DETECTED NONE DETECTED  Urinalysis, Routine w reflex microscopic  Result Value Ref Range   Color, Urine AMBER (A) YELLOW   APPearance TURBID (A) CLEAR   Specific Gravity, Urine 1.015 1.005 - 1.030   pH 8.0 5.0 - 8.0   Glucose, UA NEGATIVE NEGATIVE mg/dL   Hgb urine dipstick SMALL (A) NEGATIVE   Bilirubin Urine NEGATIVE NEGATIVE   Ketones, ur 5 (A) NEGATIVE mg/dL   Protein, ur 161 (A) NEGATIVE mg/dL   Nitrite NEGATIVE NEGATIVE   Leukocytes, UA MODERATE (A) NEGATIVE   RBC / HPF TOO NUMEROUS TO COUNT 0 - 5 RBC/hpf   WBC, UA TOO NUMEROUS TO COUNT 0 - 5 WBC/hpf   Bacteria, UA MANY (A) NONE SEEN   WBC Clumps PRESENT   Acetaminophen level  Result Value Ref Range   Acetaminophen (Tylenol), Serum <10 (L) 10 - 30 ug/mL  Comprehensive metabolic panel  Result Value Ref Range   Sodium 143 135 - 145 mmol/L   Potassium 5.3 (H) 3.5 - 5.1 mmol/L   Chloride 111  101 - 111 mmol/L   CO2 21 (L) 22 - 32 mmol/L   Glucose, Bld 64 (L) 65 - 99 mg/dL   BUN 37 (H) 6 - 20 mg/dL   Creatinine, Ser 1.61 (H) 0.44 - 1.00 mg/dL   Calcium 8.1 (L) 8.9 - 10.3 mg/dL   Total Protein 6.5 6.5 - 8.1 g/dL   Albumin 1.7 (L) 3.5 - 5.0 g/dL   AST 61 (H) 15 - 41 U/L   ALT 18 14 - 54 U/L   Alkaline Phosphatase 126 38 - 126 U/L   Total Bilirubin 2.8 (H) 0.3 - 1.2 mg/dL   GFR calc non Af Amer 18 (L) >60 mL/min   GFR calc Af Amer 20 (L) >60 mL/min   Anion gap 11 5 - 15  Ethanol  Result Value Ref Range   Alcohol, Ethyl (B) <5 <5 mg/dL  Brain natriuretic peptide  Result Value Ref Range   B Natriuretic Peptide 316.0 (H) 0.0 - 100.0 pg/mL  Salicylate level  Result Value Ref Range   Salicylate Lvl <7.0 2.8 - 30.0 mg/dL  Troponin I  Result Value Ref Range   Troponin I 0.13 (HH) <0.03 ng/mL  Lactic acid, plasma  Result Value Ref Range   Lactic Acid, Venous 4.6 (HH) 0.5 - 1.9 mmol/L  CBC with Differential    Result Value Ref Range   WBC 10.5 4.0 - 10.5 K/uL   RBC 2.86 (L) 3.87 - 5.11 MIL/uL   Hemoglobin 9.6 (L) 12.0 - 15.0 g/dL   HCT 09.6 (L) 04.5 - 40.9 %   MCV 102.8 (H) 78.0 - 100.0 fL   MCH 33.6 26.0 - 34.0 pg   MCHC 32.7 30.0 - 36.0 g/dL   RDW 81.1 (H) 91.4 - 78.2 %   Platelets 84 (L) 150 - 400 K/uL   Neutrophils Relative % 53 %   Neutro Abs 5.6 1.7 - 7.7 K/uL   Lymphocytes Relative 35 %   Lymphs Abs 3.7 0.7 - 4.0 K/uL   Monocytes Relative 10 %   Monocytes Absolute 1.0 0.1 - 1.0 K/uL   Eosinophils Relative 1 %   Eosinophils Absolute 0.1 0.0 - 0.7 K/uL   Basophils Relative 1 %   Basophils Absolute 0.1 0.0 - 0.1 K/uL  Protime-INR  Result Value Ref Range   Prothrombin Time 23.4 (H) 11.4 - 15.2 seconds   INR 2.05   Ammonia  Result Value Ref Range   Ammonia 157 (H) 9 - 35 umol/L  Type and screen [facility]  Result Value Ref Range   ABO/RH(D) AB NEG    Antibody Screen NEG    Sample Expiration 09/26/2016    Dg Chest 1 View Result Date: 2016-10-09 CLINICAL DATA:  Altered mental status. EXAM: CHEST 1 VIEW COMPARISON:  None. FINDINGS: Normal sized heart. Clear lungs with normal vascularity. Mild diffuse peribronchial thickening. Mild to moderate scoliosis. IMPRESSION: Mild bronchitic changes. Electronically Signed   By: Beckie Salts M.D.   On: 10-09-2016 15:35   Ct Head Wo Contrast Result Date: 10-09-16 CLINICAL DATA:  AMS, young family member called EMS rregarding pt unresponsive Pt is filthy with friable skin multiple skin tears and fixed pupils. No medical hx available EXAM: CT HEAD WITHOUT CONTRAST TECHNIQUE: Contiguous axial images were obtained from the base of the skull through the vertex without intravenous contrast. COMPARISON:  None. FINDINGS: Brain: There is an old infarct of the left frontal lobe. There is no evidence of a recent infarct. There are no parenchymal masses  or mass effect. The ventricles are normal configuration. There is ventricular sulcal enlargement  reflecting mild generalized atrophy. No hydrocephalus. There are no extra-axial masses or abnormal fluid collections. There is no intracranial hemorrhage. Vascular: No hyperdense vessel or unexpected calcification. Skull: Normal. Negative for fracture or focal lesion. Sinuses/Orbits: Dependent fluid is seen in both maxillary sinuses. Remaining visualized sinuses are clear. Clear mastoid air cells. Other: None. IMPRESSION: 1. No acute intracranial abnormalities. 2. Mild atrophy.  Old left frontal lobe infarct. 3. Bilateral maxillary sinus air-fluid levels. Consider acute sinusitis in the proper clinical setting. Electronically Signed   By: Amie Portland M.D.   On: October 12, 2016 15:56     1610:  Pt continues to breathe on her own, respond to stimuli (ie: she will look at whomever is passively moving her arm).  PT/INR, BUN/Cr elevated from baseline. Platelets per baseline. Troponin and BNP also elevated, but no acute CHF on CXR. The patient is noted to have a lactate>4. With the current information available to me, it is unclear if this is solely due to septic shock, and is more likely multifactorial, including related to her liver failure. Multiple IVF boluses given for tachycardia, BP's, and clinical dehydration; baseline BP's 100-110's per EPIC and family states pt's BP often "is in the 80's and 90's lately." AMS likely due to liver failure, ARF, hyperammonemia.  Multiple long d/w pt's daughter, son, and husband regarding pt's critical condition and poor prognosis. They request no further heroic measures to be taken (no CPR, no intubation, no central line), keep current interventions (IVF, abx), and admission OK. Multiple family members have slowly come to the ED: this also d/w them and all are in agreement regarding plan of care. DNR signed and witnessed.   1620:  T/C to [facility]. Ophelia Charter, case discussed, including:  HPI, pertinent PM/SHx, VS/PE, dx testing, ED course and treatment:  Agreeable to admit, requests  she will come to the ED for evaluation.     Final Clinical Impressions(s) / ED Diagnoses   Final diagnoses:  Altered mental status, unspecified altered mental status type  Acute renal failure, unspecified acute renal failure type (HCC)  Chronic liver disease  Non compliance w medication regimen    New Prescriptions New Prescriptions   No medications on file      Samuel Jester, DO 09/26/16 1732

## 2016-10-12 NOTE — ED Notes (Signed)
CRITICAL VALUE ALERT  Critical value received:  Troponin 0.13  Date of notification:  2017-03-13  Time of notification:  1523  Critical value read back:Yes.    Nurse who received alert:  Tarri Glennrystal Jakoby Melendrez RN   MD notified (1st page):  Dr Clarene DukeMcManus  Time of first page: 1523  MD notified (2nd page):  Time of second page:  Responding MD:  Dr Clarene DukeMcManus  Time MD responded:  [phone]
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
## 2016-10-12 NOTE — ED Notes (Signed)
[facility] On arrival as a report POC [employer] [place]

## 2016-10-12 DEATH — deceased

## 2017-03-29 IMAGING — CT CT HEAD W/O CM
3 series · 15 of 47 positions shown, 18 images · non-contrast
Comparison: None.

CLINICAL DATA: AMS, young family member called EMS rregarding pt
unresponsive

Pt is filthy with friable skin multiple skin tears and fixed pupils.
No medical hx available
EXAM:
CT HEAD WITHOUT CONTRAST
TECHNIQUE: Contiguous axial images were obtained from the base of the skull
through the vertex without intravenous contrast.

[Series 3: head wo · axial · 0.46mm/px · z∈[+31,+171]mm · 9 of 34 slices shown, 12 images]
[im 3/34  brain]
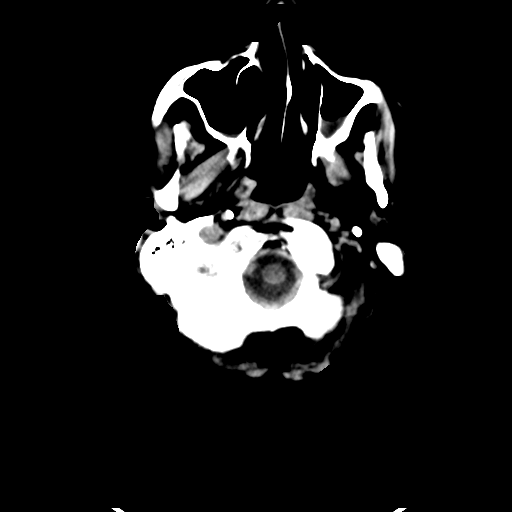
[im 3/34  bone]
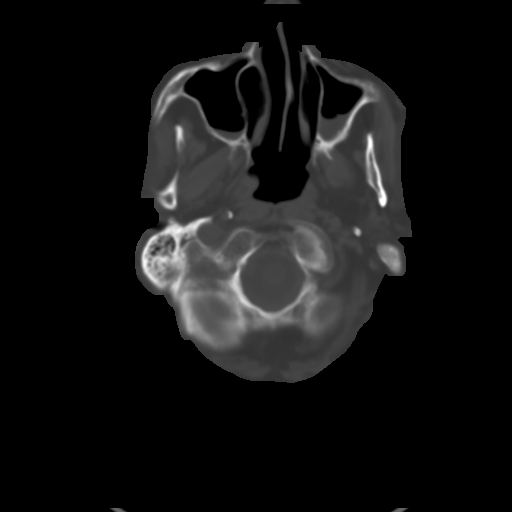
[im 6/34  brain]
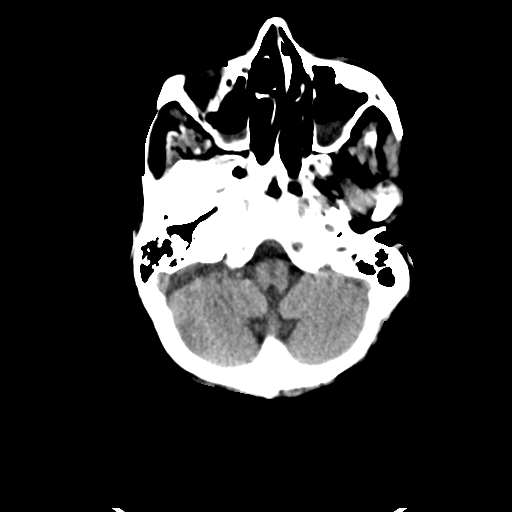
[im 10/34  brain]
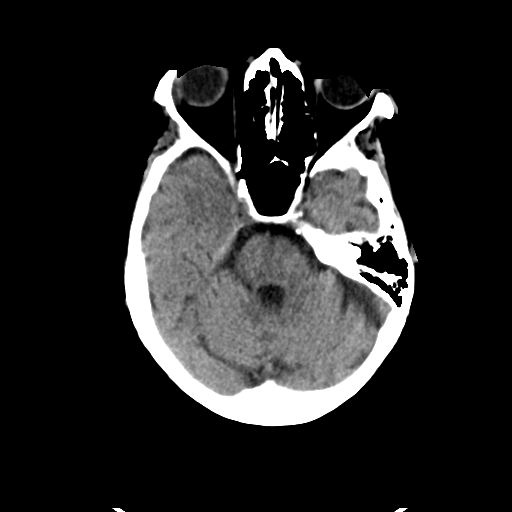
[im 13/34  brain]
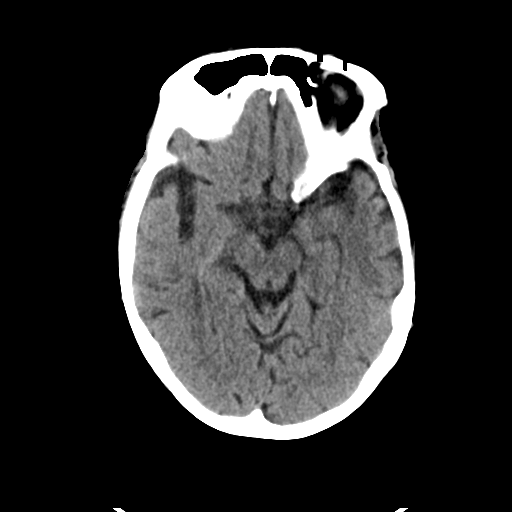
[im 18/34  brain]
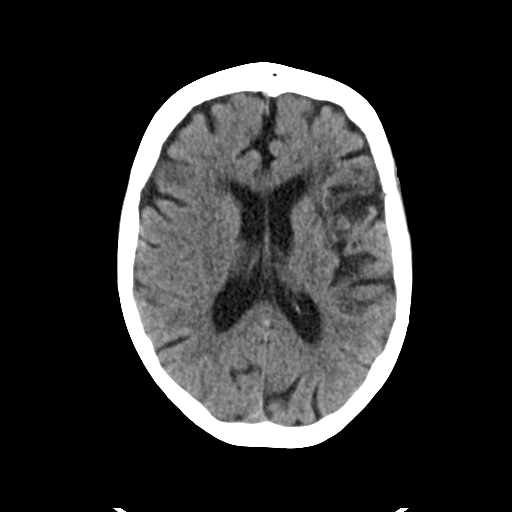
[im 18/34  bone]
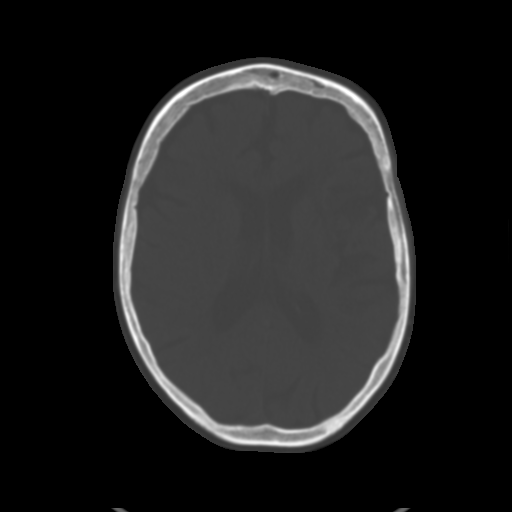
[im 21/34  brain]
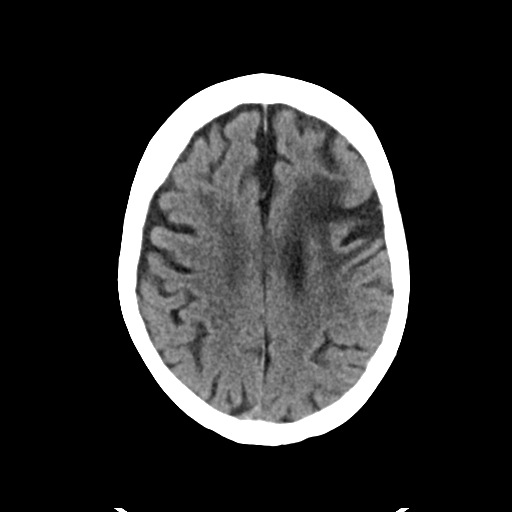
[im 24/34  brain]
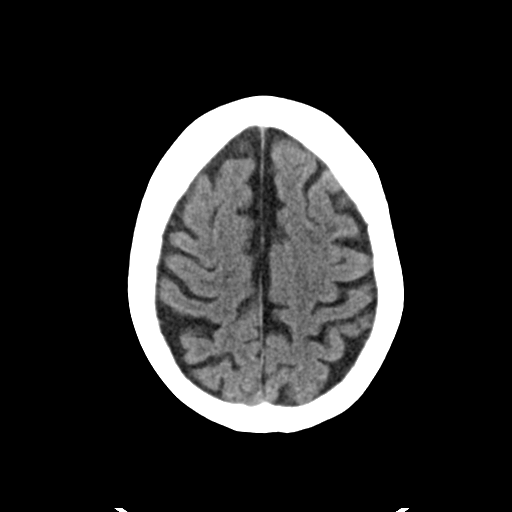
[im 28/34  brain]
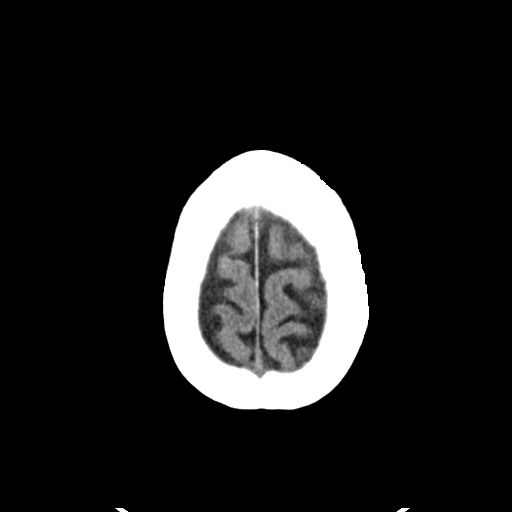
[im 31/34  brain]
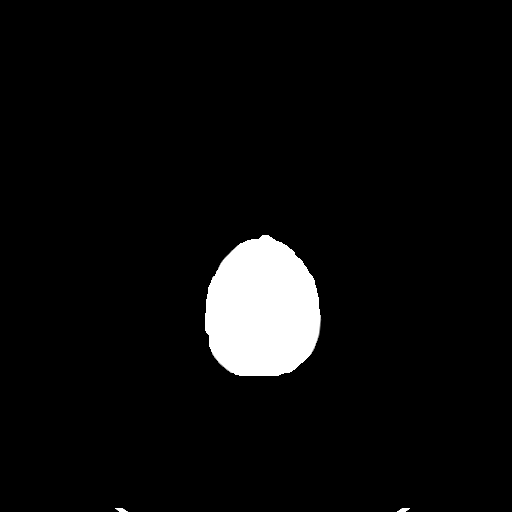
[im 31/34  bone]
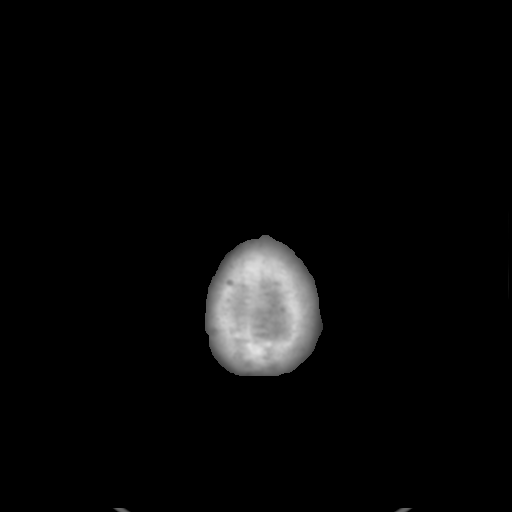

[Series 5: coronal soft tissue · coronal · 0.35mm/px · 3 of 68 slices shown]
[im 23/68  brain]
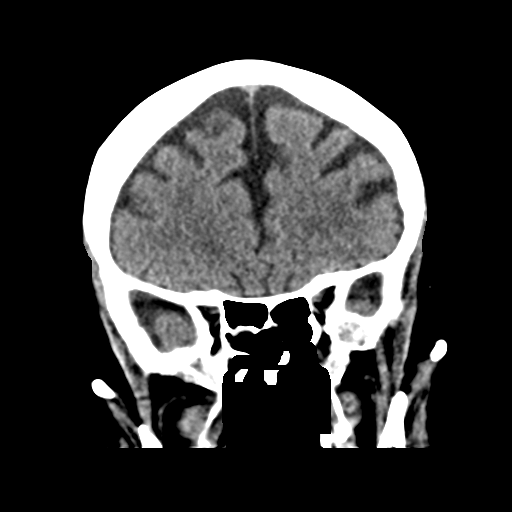
[im 30/68  brain]
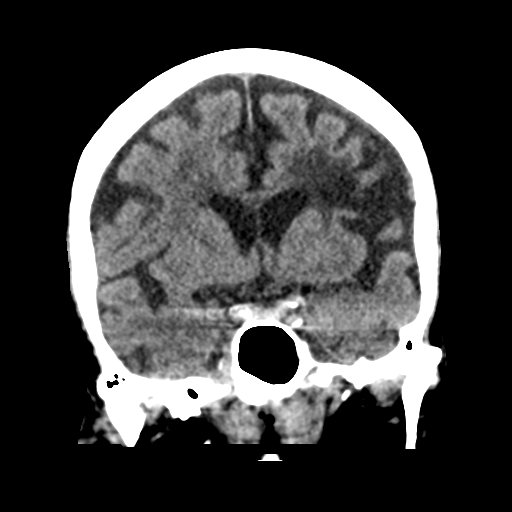
[im 38/68  brain]
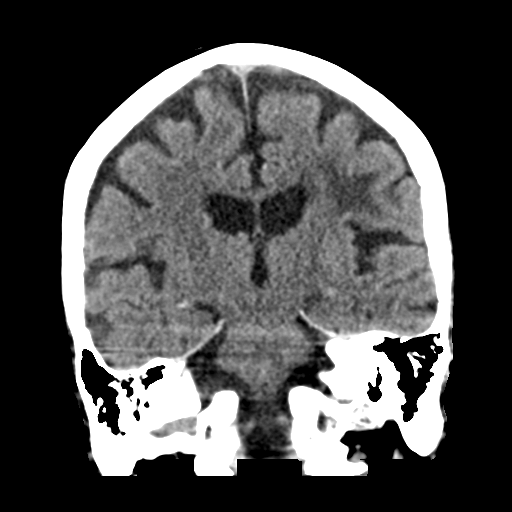

[Series 6: sagittal soft tissue · sagittal · 0.33mm/px · 3 of 67 slices shown]
[im 23/67  brain]
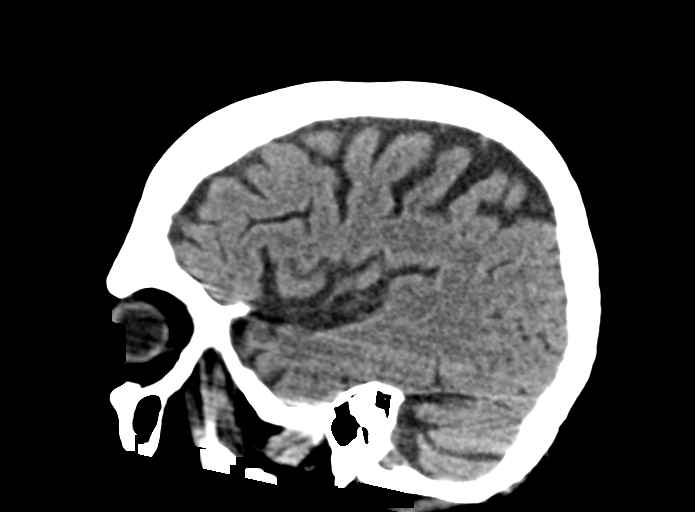
[im 34/67  brain]
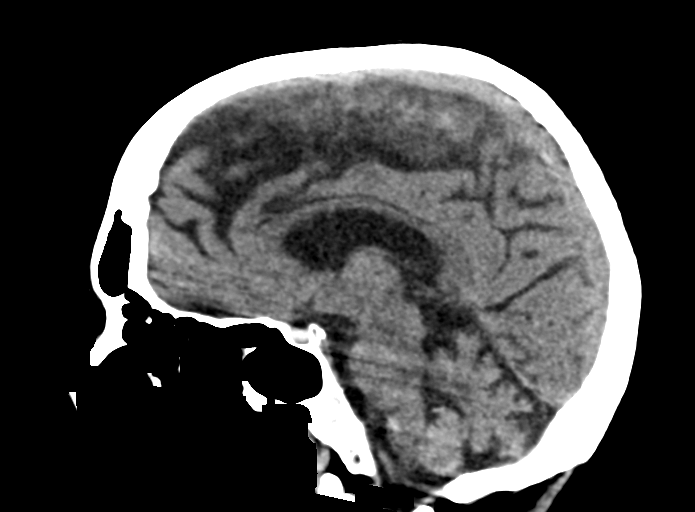
[im 45/67  brain]
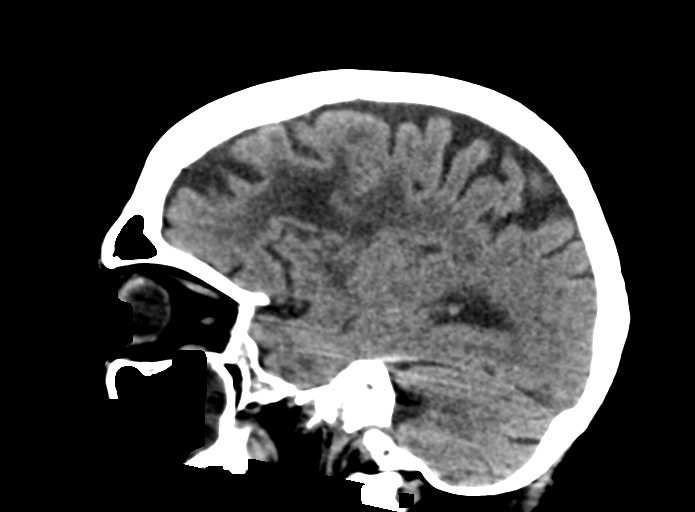

[15 of 47 positions shown; findings below may reference images not displayed]

FINDINGS: Brain: There is an old infarct of the left frontal lobe. There is no
evidence of a recent infarct. There are no parenchymal masses or
mass effect. The ventricles are normal configuration. There is
ventricular sulcal enlargement reflecting mild generalized atrophy.
No hydrocephalus.

There are no extra-axial masses or abnormal fluid collections.

There is no intracranial hemorrhage.

Vascular: No hyperdense vessel or unexpected calcification.

Skull: Normal. Negative for fracture or focal lesion.

Sinuses/Orbits: Dependent fluid is seen in both maxillary sinuses.
Remaining visualized sinuses are clear. Clear mastoid air cells.

Other: None.
IMPRESSION: 1. No acute intracranial abnormalities.
2. Mild atrophy.  Old left frontal lobe infarct.
3. Bilateral maxillary sinus air-fluid levels. Consider acute
sinusitis in the proper clinical setting.
